# Patient Record
Sex: Male | Born: 1935 | Race: White | Hispanic: No | Marital: Married | Smoking: Former smoker
Health system: Southern US, Community
[De-identification: ages and names within clinical notes are randomized; demographics above are authoritative.]

## PROBLEM LIST (undated history)

## (undated) DIAGNOSIS — G459 Transient cerebral ischemic attack, unspecified: Secondary | ICD-10-CM

## (undated) DIAGNOSIS — K5900 Constipation, unspecified: Secondary | ICD-10-CM

## (undated) DIAGNOSIS — I6523 Occlusion and stenosis of bilateral carotid arteries: Secondary | ICD-10-CM

## (undated) DIAGNOSIS — M79652 Pain in left thigh: Secondary | ICD-10-CM

## (undated) DIAGNOSIS — A4189 Other specified sepsis: Secondary | ICD-10-CM

## (undated) DIAGNOSIS — E7800 Pure hypercholesterolemia, unspecified: Secondary | ICD-10-CM

## (undated) DIAGNOSIS — C61 Malignant neoplasm of prostate: Secondary | ICD-10-CM

## (undated) DIAGNOSIS — L03115 Cellulitis of right lower limb: Secondary | ICD-10-CM

## (undated) DIAGNOSIS — A759 Typhus fever, unspecified: Secondary | ICD-10-CM

## (undated) DIAGNOSIS — R001 Bradycardia, unspecified: Secondary | ICD-10-CM

## (undated) DIAGNOSIS — I700 Atherosclerosis of aorta: Secondary | ICD-10-CM

## (undated) DIAGNOSIS — I10 Essential (primary) hypertension: Secondary | ICD-10-CM

## (undated) DIAGNOSIS — A90 Dengue fever [classical dengue]: Secondary | ICD-10-CM

## (undated) DIAGNOSIS — H9190 Unspecified hearing loss, unspecified ear: Secondary | ICD-10-CM

## (undated) DIAGNOSIS — E875 Hyperkalemia: Secondary | ICD-10-CM

## (undated) DIAGNOSIS — B379 Candidiasis, unspecified: Secondary | ICD-10-CM

## (undated) DIAGNOSIS — N183 Chronic kidney disease, stage 3 unspecified: Secondary | ICD-10-CM

## (undated) DIAGNOSIS — Z87442 Personal history of urinary calculi: Secondary | ICD-10-CM

## (undated) DIAGNOSIS — M1990 Unspecified osteoarthritis, unspecified site: Secondary | ICD-10-CM

## (undated) DIAGNOSIS — Z8673 Personal history of transient ischemic attack (TIA), and cerebral infarction without residual deficits: Secondary | ICD-10-CM

## (undated) DIAGNOSIS — N200 Calculus of kidney: Secondary | ICD-10-CM

## (undated) DIAGNOSIS — M25532 Pain in left wrist: Secondary | ICD-10-CM

## (undated) DIAGNOSIS — N529 Male erectile dysfunction, unspecified: Secondary | ICD-10-CM

## (undated) DIAGNOSIS — J209 Acute bronchitis, unspecified: Secondary | ICD-10-CM

## (undated) DIAGNOSIS — Z87440 Personal history of urinary (tract) infections: Secondary | ICD-10-CM

## (undated) HISTORY — DX: Chronic kidney disease, stage 3 unspecified: N18.30

## (undated) HISTORY — DX: Male erectile dysfunction, unspecified: N52.9

## (undated) HISTORY — DX: Hyperkalemia: E87.5

## (undated) HISTORY — PX: CATARACT EXTRACTION: SUR2

## (undated) HISTORY — DX: Occlusion and stenosis of bilateral carotid arteries: I65.23

## (undated) HISTORY — PX: LEG SURGERY: SHX1003

## (undated) HISTORY — PX: TOTAL HIP ARTHROPLASTY: SHX124

## (undated) HISTORY — DX: Unspecified osteoarthritis, unspecified site: M19.90

## (undated) HISTORY — DX: Pain in left wrist: M25.532

## (undated) HISTORY — DX: Atherosclerosis of aorta: I70.0

## (undated) HISTORY — DX: Calculus of kidney: N20.0

## (undated) HISTORY — DX: Pain in left thigh: M79.652

## (undated) HISTORY — DX: Acute bronchitis, unspecified: J20.9

## (undated) HISTORY — DX: Bradycardia, unspecified: R00.1

## (undated) HISTORY — DX: Other specified sepsis: A41.89

## (undated) HISTORY — DX: Malignant neoplasm of prostate: C61

## (undated) HISTORY — DX: Candidiasis, unspecified: B37.9

## (undated) HISTORY — DX: Personal history of transient ischemic attack (TIA), and cerebral infarction without residual deficits: Z86.73

## (undated) HISTORY — DX: Cellulitis of right lower limb: L03.115

## (undated) HISTORY — DX: Unspecified hearing loss, unspecified ear: H91.90

## (undated) HISTORY — DX: Pure hypercholesterolemia, unspecified: E78.00

## (undated) HISTORY — DX: Constipation, unspecified: K59.00

## (undated) HISTORY — DX: Personal history of urinary (tract) infections: Z87.440

---

## 1995-04-19 HISTORY — PX: COLONOSCOPY: SHX174

## 1996-04-18 HISTORY — PX: KNEE ARTHROSCOPY: SUR90

## 2010-04-18 DIAGNOSIS — G459 Transient cerebral ischemic attack, unspecified: Secondary | ICD-10-CM

## 2010-04-18 HISTORY — DX: Transient cerebral ischemic attack, unspecified: G45.9

## 2011-04-19 DIAGNOSIS — J189 Pneumonia, unspecified organism: Secondary | ICD-10-CM

## 2011-04-19 HISTORY — DX: Pneumonia, unspecified organism: J18.9

## 2015-12-30 DIAGNOSIS — Z7189 Other specified counseling: Secondary | ICD-10-CM | POA: Insufficient documentation

## 2015-12-30 DIAGNOSIS — Z7185 Encounter for immunization safety counseling: Secondary | ICD-10-CM | POA: Insufficient documentation

## 2016-03-01 DIAGNOSIS — I700 Atherosclerosis of aorta: Secondary | ICD-10-CM | POA: Insufficient documentation

## 2016-03-01 HISTORY — DX: Atherosclerosis of aorta: I70.0

## 2016-06-19 ENCOUNTER — Encounter: Payer: Self-pay | Admitting: Medical Oncology

## 2016-06-19 ENCOUNTER — Emergency Department
Admission: EM | Admit: 2016-06-19 | Discharge: 2016-06-19 | Disposition: A | Discharge status: Home / Self Care | Payer: Medicare Other | Attending: Emergency Medicine | Admitting: Emergency Medicine

## 2016-06-19 ENCOUNTER — Emergency Department: Payer: Medicare Other

## 2016-06-19 DIAGNOSIS — Z79899 Other long term (current) drug therapy: Secondary | ICD-10-CM | POA: Diagnosis not present

## 2016-06-19 DIAGNOSIS — W0110XA Fall on same level from slipping, tripping and stumbling with subsequent striking against unspecified object, initial encounter: Secondary | ICD-10-CM | POA: Diagnosis not present

## 2016-06-19 DIAGNOSIS — I10 Essential (primary) hypertension: Secondary | ICD-10-CM | POA: Insufficient documentation

## 2016-06-19 DIAGNOSIS — S52502A Unspecified fracture of the lower end of left radius, initial encounter for closed fracture: Secondary | ICD-10-CM | POA: Insufficient documentation

## 2016-06-19 DIAGNOSIS — Y999 Unspecified external cause status: Secondary | ICD-10-CM | POA: Insufficient documentation

## 2016-06-19 DIAGNOSIS — S59912A Unspecified injury of left forearm, initial encounter: Secondary | ICD-10-CM | POA: Diagnosis present

## 2016-06-19 DIAGNOSIS — Y939 Activity, unspecified: Secondary | ICD-10-CM | POA: Diagnosis not present

## 2016-06-19 DIAGNOSIS — Y92511 Restaurant or cafe as the place of occurrence of the external cause: Secondary | ICD-10-CM | POA: Insufficient documentation

## 2016-06-19 DIAGNOSIS — Z7982 Long term (current) use of aspirin: Secondary | ICD-10-CM | POA: Insufficient documentation

## 2016-06-19 HISTORY — DX: Transient cerebral ischemic attack, unspecified: G45.9

## 2016-06-19 HISTORY — DX: Essential (primary) hypertension: I10

## 2016-06-19 HISTORY — DX: Pure hypercholesterolemia, unspecified: E78.00

## 2016-06-19 NOTE — ED Provider Notes (Signed)
[facility] Emergency Department Provider Note ____________________________________________   I have reviewed the triage vital signs and the triage nursing note.  HISTORY  Chief Complaint Fall   Historian Patient and wife  HPI Robert [name] is a 83 y.o. male with history of hypertension and TIA, on baby aspirin, tripped and fell over a vent carpet about 6 days ago at a restaurant landed on his left outstretched arm and also struck his left forehead. No loss of consciousness. He had some left wrist soreness, but thought it would go away. He was not seen at that point time. His left wrist has continued to be painful and is actually slightly more painful and more swollen than it was in the beginning. At this point he is concerned it might be broken.  Pain is moderate. Swelling is moderate.    Past Medical History:  Diagnosis Date  . High cholesterol   . Hypertension   . TIA (transient ischemic attack)     There are no active problems to display for this patient.   Past Surgical History:  Procedure Laterality Date  . TOTAL HIP ARTHROPLASTY Bilateral     Prior to Admission medications   Medication Sig Start Date End Date Taking? Authorizing Provider  amLODipine (NORVASC) 5 MG tablet Take 5 mg by mouth daily.   Yes Historical Provider, MD  aspirin EC 325 MG tablet Take 81 mg by mouth daily.   Yes Historical Provider, MD  atorvastatin (LIPITOR) 10 MG tablet Take 10 mg by mouth daily.   Yes Historical Provider, MD  hydrALAZINE (APRESOLINE) 25 MG tablet Take 25 mg by mouth 3 (three) times daily.   Yes Historical Provider, MD  Omega-3 Fatty Acids (FISH OIL PO) Take 1 capsule by mouth daily.   Yes Historical Provider, MD    Allergies  Allergen Reactions  . Morphine And Related     No family history on file.  Social History Social History  Substance Use Topics  . Smoking status: Not on file  . Smokeless tobacco: Not on file  . Alcohol use Not on file     Review of Systems  Constitutional: Negative for recent illness. Eyes: Negative for visual changes. ENT: Negative for sore throat. Cardiovascular: Negative for chest pain. Respiratory: Negative for shortness of breath. Gastrointestinal: Negative for abdominal pain. Genitourinary: Negative for dysuria. Musculoskeletal: Negative for back pain.  Left wrist pain as per history of present illness. Skin: Negative for rash. Neurological: Negative for headache. 10 point Review of Systems otherwise negative ____________________________________________   PHYSICAL EXAM:  VITAL SIGNS: ED Triage Vitals  Enc Vitals Group     BP 06/19/16 0841 (!) 181/81     Pulse Rate 06/19/16 0841 63     Resp 06/19/16 0841 17     Temp --      Temp Source 06/19/16 0841 Oral     SpO2 06/19/16 0841 96 %     Weight 06/19/16 0842 212 lb (96.2 kg)     Height 06/19/16 0842 6' (1.829 m)     Head Circumference --      Peak Flow --      Pain Score --      Pain Loc --      Pain Edu? --      Excl. in [place]? --      Constitutional: Alert and oriented. Well appearing and in no distress. HEENT   Head: Normocephalic.  Small ill-appearing      Eyes: Conjunctivae are  normal. PERRL. Normal extraocular movements.      Ears:         Nose: No congestion/rhinnorhea.   Mouth/Throat: Mucous membranes are moist.   Neck: No stridor. Cardiovascular/Chest: Normal rate, regular rhythm.  No murmurs, rubs, or gallops. Respiratory: Normal respiratory effort without tachypnea nor retractions. Breath sounds are clear and equal bilaterally. No wheezes/rales/rhonchi. Gastrointestinal: Soft. No distention, no guarding, no rebound. Nontender.    Genitourinary/rectal:Deferred Musculoskeletal: Swelling and tenderness distal left arm at the wrist. No snuffbox tenderness. Neurovascularly intact. Neurologic:  Normal speech and language. No gross or focal neurologic deficits are appreciated. Skin:  Skin is warm, dry and intact.  No rash noted. Psychiatric: Mood and affect are normal. Speech and behavior are normal. Patient exhibits appropriate insight and judgment.   ____________________________________________  LABS (pertinent positives/negatives)  Labs Reviewed - No data to display  ____________________________________________    EKG I, Lisa Roca, MD, the attending physician have personally viewed and interpreted all ECGs.  None ____________________________________________  RADIOLOGY All Xrays were viewed by me. Imaging interpreted by Radiologist.  Left upper extremity x-ray:  IMPRESSION: Suspect small avulsion injury along the radial and volar aspect of the distal radius. __________________________________________  PROCEDURES  Procedure(s) performed: SPLINT APPLICATION Date/Time: 99991111 AM Authorized by: Lisa Roca Consent: Verbal consent obtained. Risks and benefits: risks, benefits and alternatives were discussed Consent given by: patient Splint applied by: ED technician, reviewed by Dr. Reita Cliche MD Location details: left volar, short arm Supplies used: ortho glass and acewrap Post-procedure: The splinted body part was neurovascularly unchanged following the procedure. Patient tolerance: Patient tolerated the procedure well with no immediate complications.    Critical Care performed: None  ____________________________________________   ED COURSE / ASSESSMENT AND PLAN  Pertinent labs & imaging results that were available during my care of the patient were reviewed by me and considered in my medical decision making (see chart for details).   [name] had a fall a week ago and is still having pain and swelling, I suspect likely underlying fracture.  Neurovascularly intact clinically.  X-ray confirms small avulsion fracture of the distal radius. Patient was placed in a volar splint. He will be referred to orthopedics for further follow-up and possible  casting.    CONSULTATIONS:  None   Patient / Family / Caregiver informed of clinical course, medical decision-making process, and agree with plan.   I discussed return precautions, follow-up instructions, and discharge instructions with patient and/or family.  DISCHARGE INSTRUCTIONS:  Your x-ray shows fracture of the left distal radius, near the wrist. You're placed in a splint. Please follow up with orthopedic surgeon for likely casting.  Return to emergency department immediately for any worsening pain, swelling, numbness or tingling or bluish discoloration to the fingers. ___________________________________________   FINAL CLINICAL IMPRESSION(S) / ED DIAGNOSES   Final diagnoses:  Closed fracture of distal end of left radius, unspecified fracture morphology, initial encounter              Note: This dictation was prepared with Dragon dictation. Any transcriptional errors that result from this process are unintentional    Lisa Roca, MD 06/19/16 1012

## 2016-06-19 NOTE — Discharge Instructions (Signed)
Your x-ray shows fracture of the left distal radius, near the wrist. You're placed in a splint. Please follow up with orthopedic surgeon for likely casting.  Return to emergency department immediately for any worsening pain, swelling, numbness or tingling or bluish discoloration to the fingers.

## 2016-06-19 NOTE — ED Notes (Addendum)
FIRST NURSE NOTE: Pt fell 6 days ago on to left wrist, continues to c/o pain. Pt states he tripped on the carpet at a restaurant. Pt states he also had a small abrasion to the left eye brown which has since healed. Pt states he only takes an 81mg  ASA.

## 2016-06-19 NOTE — ED Notes (Signed)
XR at bedside

## 2016-06-19 NOTE — ED Triage Notes (Signed)
Pt reports that he was in a restaurant when he tripped over the carpet Monday. Pt reports he fell onto his left wrist and injured it along with hitting his head. Pt denies LOC, no use of blood thinners. Pt A/O x 4

## 2016-06-27 DIAGNOSIS — N528 Other male erectile dysfunction: Secondary | ICD-10-CM | POA: Insufficient documentation

## 2016-09-29 ENCOUNTER — Encounter: Payer: Self-pay | Admitting: Emergency Medicine

## 2016-09-29 ENCOUNTER — Emergency Department
Admission: EM | Admit: 2016-09-29 | Discharge: 2016-09-29 | Disposition: A | Discharge status: Home / Self Care | Payer: Medicare Other | Attending: Emergency Medicine | Admitting: Emergency Medicine

## 2016-09-29 DIAGNOSIS — R319 Hematuria, unspecified: Secondary | ICD-10-CM | POA: Diagnosis not present

## 2016-09-29 DIAGNOSIS — Z8673 Personal history of transient ischemic attack (TIA), and cerebral infarction without residual deficits: Secondary | ICD-10-CM | POA: Insufficient documentation

## 2016-09-29 DIAGNOSIS — Z96643 Presence of artificial hip joint, bilateral: Secondary | ICD-10-CM | POA: Diagnosis not present

## 2016-09-29 DIAGNOSIS — I10 Essential (primary) hypertension: Secondary | ICD-10-CM | POA: Diagnosis not present

## 2016-09-29 DIAGNOSIS — N3090 Cystitis, unspecified without hematuria: Secondary | ICD-10-CM | POA: Insufficient documentation

## 2016-09-29 DIAGNOSIS — N489 Disorder of penis, unspecified: Secondary | ICD-10-CM | POA: Diagnosis present

## 2016-09-29 LAB — URINALYSIS, ROUTINE W REFLEX MICROSCOPIC
BILIRUBIN URINE: NEGATIVE
Glucose, UA: NEGATIVE mg/dL
Ketones, ur: NEGATIVE mg/dL
Nitrite: NEGATIVE
PH: 5 (ref 5.0–8.0)
Protein, ur: 100 mg/dL — AB
SPECIFIC GRAVITY, URINE: 1.011 (ref 1.005–1.030)
SQUAMOUS EPITHELIAL / LPF: NONE SEEN

## 2016-09-29 LAB — URINALYSIS, COMPLETE (UACMP) WITH MICROSCOPIC
Bilirubin Urine: NEGATIVE
Glucose, UA: NEGATIVE mg/dL
KETONES UR: 5 mg/dL — AB
Leukocytes, UA: NEGATIVE
Nitrite: POSITIVE — AB
PH: 5 (ref 5.0–8.0)
Protein, ur: 100 mg/dL — AB
SPECIFIC GRAVITY, URINE: 1.02 (ref 1.005–1.030)
Squamous Epithelial / LPF: NONE SEEN

## 2016-09-29 LAB — PROTIME-INR
INR: 1.01
Prothrombin Time: 13.3 seconds (ref 11.4–15.2)

## 2016-09-29 LAB — TYPE AND SCREEN
ABO/RH(D): AB POS
ANTIBODY SCREEN: NEGATIVE

## 2016-09-29 LAB — COMPREHENSIVE METABOLIC PANEL
ALBUMIN: 4.1 g/dL (ref 3.5–5.0)
ALK PHOS: 63 U/L (ref 38–126)
ALT: 15 U/L — ABNORMAL LOW (ref 17–63)
AST: 22 U/L (ref 15–41)
Anion gap: 6 (ref 5–15)
BUN: 27 mg/dL — AB (ref 6–20)
CALCIUM: 9.6 mg/dL (ref 8.9–10.3)
CO2: 23 mmol/L (ref 22–32)
Chloride: 113 mmol/L — ABNORMAL HIGH (ref 101–111)
Creatinine, Ser: 1.46 mg/dL — ABNORMAL HIGH (ref 0.61–1.24)
GFR calc Af Amer: 46 mL/min — ABNORMAL LOW (ref >60)
GFR calc non Af Amer: 40 mL/min — ABNORMAL LOW (ref >60)
Glucose, Bld: 83 mg/dL (ref 65–99)
Potassium: 4.3 mmol/L (ref 3.5–5.1)
Sodium: 142 mmol/L (ref 135–145)
TOTAL PROTEIN: 7.1 g/dL (ref 6.5–8.1)
Total Bilirubin: 0.6 mg/dL (ref 0.3–1.2)

## 2016-09-29 LAB — CBC WITH DIFFERENTIAL/PLATELET
Basophils Absolute: 0.1 10*3/uL (ref 0–0.1)
Basophils Relative: 1 %
EOS PCT: 3 %
Eosinophils Absolute: 0.3 10*3/uL (ref 0–0.7)
HEMATOCRIT: 41.8 % (ref 40.0–52.0)
HEMOGLOBIN: 14.1 g/dL (ref 13.0–18.0)
LYMPHS ABS: 1.5 10*3/uL (ref 1.0–3.6)
LYMPHS PCT: 15 %
MCH: 31.7 pg (ref 26.0–34.0)
MCHC: 33.6 g/dL (ref 32.0–36.0)
MCV: 94.1 fL (ref 80.0–100.0)
Monocytes Absolute: 1.1 10*3/uL — ABNORMAL HIGH (ref 0.2–1.0)
Monocytes Relative: 10 %
NEUTROS PCT: 71 %
Neutro Abs: 7.4 10*3/uL — ABNORMAL HIGH (ref 1.4–6.5)
Platelets: 191 10*3/uL (ref 150–440)
RBC: 4.44 MIL/uL (ref 4.40–5.90)
RDW: 15.8 % — ABNORMAL HIGH (ref 11.5–14.5)
WBC: 10.4 10*3/uL (ref 3.8–10.6)

## 2016-09-29 MED ORDER — LIDOCAINE HCL 2 % EX GEL
CUTANEOUS | Status: AC
  Administered 2016-09-29: 13:00:00
  Filled 2016-09-29: qty 10

## 2016-09-29 MED ORDER — DEXTROSE 5 % IV SOLN
1.0000 g | Freq: Once | INTRAVENOUS | Status: AC
  Administered 2016-09-29: 1 g via INTRAVENOUS
  Filled 2016-09-29: qty 10

## 2016-09-29 MED ORDER — CEPHALEXIN 250 MG PO CAPS: 250.0000 mg | ORAL_CAPSULE | Freq: Four times a day (QID) | ORAL | 0 refills | Status: DC

## 2016-09-29 NOTE — ED Provider Notes (Signed)
[facility] Emergency Department Provider Note       Time seen: ----------------------------------------- 9:28 AM on 09/29/2016 -----------------------------------------     I have reviewed the triage vital signs and the nursing notes.   HISTORY   Chief Complaint blood from penis    HPI Robert [name] is a 83 y.o. male who presents to the ED for hematuria. Patient has saturated multiple pads today from the bleeding. Bleeding seemed to start last night, he denies any pain or any urinary hesitancy or urgency. He denies fevers, chills, other complaints. Patient has not had a history of this before, nothing makes it better or worse.   Past Medical History:  Diagnosis Date  . High cholesterol   . Hypertension   . TIA (transient ischemic attack)     There are no active problems to display for this patient.   Past Surgical History:  Procedure Laterality Date  . TOTAL HIP ARTHROPLASTY Bilateral     Allergies Morphine and related  Social History Social History  Substance Use Topics  . Smoking status: Not on file  . Smokeless tobacco: Not on file  . Alcohol use Not on file    Review of Systems Constitutional: Negative for fever. Cardiovascular: Negative for chest pain. Respiratory: Negative for shortness of breath. Gastrointestinal: Negative for abdominal pain, vomiting and diarrhea. Genitourinary: Positive for hematuria Musculoskeletal: Negative for back pain. Skin: Negative for rash. Neurological: Negative for headaches, focal weakness or numbness.  All systems negative/normal/unremarkable except as stated in the HPI  ____________________________________________   PHYSICAL EXAM:  VITAL SIGNS: ED Triage Vitals  Enc Vitals Group     BP 09/29/16 0921 126/68     Pulse Rate 09/29/16 0921 88     Resp 09/29/16 0921 20     Temp 09/29/16 0921 97.4 F (36.3 C)     Temp Source 09/29/16 0921 Oral     SpO2 09/29/16 0921 97 %     Weight  09/29/16 0922 210 lb (95.3 kg)     Height 09/29/16 0922 6' (1.829 m)     Head Circumference --      Peak Flow --      Pain Score --      Pain Loc --      Pain Edu? --      Excl. in [place]? --     Constitutional: Alert and oriented. Well appearing and in no distress. Eyes: Conjunctivae are normal. Normal extraocular movements. ENT   Head: Normocephalic and atraumatic.   Nose: No congestion/rhinnorhea.   Mouth/Throat: Mucous membranes are moist.   Neck: No stridor. Cardiovascular: Normal rate, regular rhythm. No murmurs, rubs, or gallops. Respiratory: Normal respiratory effort without tachypnea nor retractions. Breath sounds are clear and equal bilaterally. No wheezes/rales/rhonchi. Gastrointestinal: Soft and nontender. Normal bowel sounds Genitourinary: Positive for frank hematuria Musculoskeletal: Nontender with normal range of motion in extremities. No lower extremity tenderness nor edema. Neurologic:  Normal speech and language. No gross focal neurologic deficits are appreciated.  Skin:  Skin is warm, dry and intact. No rash noted. Psychiatric: Mood and affect are normal. Speech and behavior are normal.  ____________________________________________  ED COURSE:  Pertinent labs & imaging results that were available during my care of the patient were reviewed by me and considered in my medical decision making (see chart for details). Patient presents for hematuria, we will assess with labs and imaging as indicated. Clinical Course as of Sep 30 1330  Thu Sep 29, 2016  1217 16  [place] coud catheter placed by me. We will initiate some bladder irrigation.  [JW]  1251 Urine is now clear after bladder irrigation  [JW]    Clinical Course User Index [JW] Earleen Newport, MD   Procedures ____________________________________________   LABS (pertinent positives/negatives)  Labs Reviewed  CBC WITH DIFFERENTIAL/PLATELET - Abnormal; Notable for the following:       Result  Value   RDW 15.8 (*)    Neutro Abs 7.4 (*)    Monocytes Absolute 1.1 (*)    All other components within normal limits  COMPREHENSIVE METABOLIC PANEL - Abnormal; Notable for the following:    Chloride 113 (*)    BUN 27 (*)    Creatinine, Ser 1.46 (*)    ALT 15 (*)    GFR calc non Af Amer 40 (*)    GFR calc Af Amer 46 (*)    All other components within normal limits  URINALYSIS, COMPLETE (UACMP) WITH MICROSCOPIC - Abnormal; Notable for the following:    Color, Urine AMBER (*)    APPearance CLOUDY (*)    Hgb urine dipstick MODERATE (*)    Ketones, ur 5 (*)    Protein, ur 100 (*)    Nitrite POSITIVE (*)    Bacteria, UA MANY (*)    All other components within normal limits  URINALYSIS, ROUTINE W REFLEX MICROSCOPIC - Abnormal; Notable for the following:    Color, Urine AMBER (*)    APPearance HAZY (*)    Hgb urine dipstick LARGE (*)    Protein, ur 100 (*)    Leukocytes, UA TRACE (*)    Bacteria, UA RARE (*)    All other components within normal limits  URINE CULTURE  PROTIME-INR  TYPE AND SCREEN   ____________________________________________  FINAL ASSESSMENT AND PLAN  Hematuria, cystitis  Plan: Patient's labs and imaging were dictated above. Patient had presented for hematuria, we attempted three-way bladder irrigation but could not place the large catheter due to likely enlarged prostate. I placed a 68 [place] coud and we irrigated his bladder with eventual clearing. He does have possible evidence of UTI although is difficult to tell due to the amount of blood he was passing. We did start him on IV antibiotics and be discharged with Keflex. I will refer him to urology for outpatient follow-up.   Earleen Newport, MD   Note: This note was generated in part or whole with voice recognition software. Voice recognition is usually quite accurate but there are transcription errors that can and very often do occur. I apologize for any typographical errors that were not  detected and corrected.     Earleen Newport, MD 09/29/16 [phone]

## 2016-09-29 NOTE — ED Notes (Signed)
Foley attached to leg bag, pts wife shown how to emtpy leg bag

## 2016-09-29 NOTE — ED Triage Notes (Signed)
Pt wife reports he saturated 5 depends from bleeding from penis last night. Pt denies any pain.  NAD. Not weak at this time.

## 2016-10-01 LAB — URINE CULTURE: SPECIAL REQUESTS: NORMAL

## 2016-10-03 ENCOUNTER — Ambulatory Visit (INDEPENDENT_AMBULATORY_CARE_PROVIDER_SITE_OTHER): Payer: Medicare Other | Admitting: Urology

## 2016-10-03 ENCOUNTER — Encounter: Payer: Self-pay | Admitting: Urology

## 2016-10-03 VITALS — BP 110/66 | HR 84 | Ht 72.0 in | Wt 210.0 lb

## 2016-10-03 DIAGNOSIS — N3001 Acute cystitis with hematuria: Secondary | ICD-10-CM | POA: Diagnosis not present

## 2016-10-03 DIAGNOSIS — I10 Essential (primary) hypertension: Secondary | ICD-10-CM | POA: Insufficient documentation

## 2016-10-03 DIAGNOSIS — R339 Retention of urine, unspecified: Secondary | ICD-10-CM | POA: Diagnosis not present

## 2016-10-03 DIAGNOSIS — E7800 Pure hypercholesterolemia, unspecified: Secondary | ICD-10-CM | POA: Insufficient documentation

## 2016-10-03 DIAGNOSIS — Z8673 Personal history of transient ischemic attack (TIA), and cerebral infarction without residual deficits: Secondary | ICD-10-CM | POA: Insufficient documentation

## 2016-10-03 MED ORDER — CIPROFLOXACIN HCL 500 MG PO TABS: 500.0000 mg | ORAL_TABLET | Freq: Two times a day (BID) | ORAL | 0 refills | Status: DC

## 2016-10-03 NOTE — Progress Notes (Signed)
10/03/2016 9:00 AM   Robert [name] [birth year] [id number]  Referring provider: Dion Body, MD [facility] [place], [place] [address]  Chief Complaint  Patient presents with  . Urinary Retention    New Patient    HPI: The gentleman was assessed in the emergency room on 09/29/2016 for blood in the urine it appeared to be high-volume soaking pads 24 hours. He was treated with a 16 [place] coud catheter and some bladder irrigation to clear. Serum creatinine was 1.46. He was given IV antibiotics and sent home on Keflex. He did not have a CT scan. He did have a positive urine culture resistant to Keflex.   The patient had a kidney stone years ago but has never had previous GU surgery. He does not get bladder infections. He normally voids every 2 or 3 hours and gets up twice at night. He wears 1 pad per day. He does not take bladder medication. The catheter urine is now clear  He has a long smoking history. He takes daily aspirin but no blood thinners  Modifying factors: There are no other modifying factors  Associated signs and symptoms: There are no other associated signs and symptoms Aggravating and relieving factors: There are no other aggravating or relieving factors Severity: Moderate Duration: Improved       PMH: Past Medical History:  Diagnosis Date  . Aortic atherosclerosis (Robert [name]) 03/01/2016  . Arthritis   . High cholesterol   . Hypertension   . TIA (transient ischemic attack)     Surgical History: Past Surgical History:  Procedure Laterality Date  . LEG SURGERY    . TOTAL HIP ARTHROPLASTY Bilateral     Home Medications:  Allergies as of 10/03/2016      Reactions   Morphine And Related    Tramadol Nausea Only   Other reaction(s): Vomiting      Medication List       Accurate as of 10/03/16  9:00 AM. Always use your most recent med list.          amLODipine 5 MG tablet Commonly known as:  NORVASC Take 5 mg by  mouth daily.   aspirin EC 325 MG tablet Take 81 mg by mouth daily.   atorvastatin 10 MG tablet Commonly known as:  LIPITOR Take 10 mg by mouth daily.   cephALEXin 250 MG capsule Commonly known as:  KEFLEX Take 1 capsule (250 mg total) by mouth 4 (four) times daily.   ciprofloxacin 500 MG tablet Commonly known as:  CIPRO Take 1 tablet (500 mg total) by mouth every 12 (twelve) hours.   FISH OIL PO Take 1 capsule by mouth daily.   hydrALAZINE 25 MG tablet Commonly known as:  APRESOLINE Take 25 mg by mouth 3 (three) times daily.       Allergies:  Allergies  Allergen Reactions  . Morphine And Related   . Tramadol Nausea Only    Other reaction(s): Vomiting    Family History: Family History  Problem Relation Age of Onset  . Prostate cancer Neg Hx   . Bladder Cancer Neg Hx   . Kidney cancer Neg Hx     Social History:  reports that he has quit smoking. He has never used smokeless tobacco. He reports that he drinks alcohol. He reports that he does not use drugs.  ROS: UROLOGY Frequent Urination?: Yes Hard to postpone urination?: Yes Burning/pain with urination?: No Get up at night to urinate?: Yes Leakage of urine?:  Yes Urine stream starts and stops?: Yes Trouble starting stream?: No Do you have to strain to urinate?: No Blood in urine?: Yes Urinary tract infection?: Yes Sexually transmitted disease?: No Injury to kidneys or bladder?: No Painful intercourse?: No Weak stream?: Yes Erection problems?: Yes Penile pain?: No  Gastrointestinal Nausea?: No Vomiting?: No Indigestion/heartburn?: No Diarrhea?: No Constipation?: No  Constitutional Fever: No Night sweats?: No Weight loss?: No Fatigue?: No  Skin Skin rash/lesions?: No Itching?: No  Eyes Blurred vision?: No Double vision?: No  Ears/Nose/Throat Sore throat?: No Sinus problems?: Yes  Hematologic/Lymphatic Swollen glands?: No Easy bruising?: No  Cardiovascular Leg swelling?:  Yes Chest pain?: No  Respiratory Cough?: No Shortness of breath?: No  Endocrine Excessive thirst?: No  Musculoskeletal Back pain?: Yes Joint pain?: No  Neurological Headaches?: No Dizziness?: No  Psychologic Depression?: No Anxiety?: No  Physical Exam: BP 110/66   Pulse 84   Ht 6' (1.829 m)   Wt 95.3 kg (210 lb)   BMI 28.48 kg/m   Constitutional:  Alert and oriented, No acute distress. HEENT: Robert [name] AT, moist mucus membranes.  Trachea midline, no masses. Cardiovascular: No clubbing, cyanosis, or edema. Respiratory: Normal respiratory effort, no increased work of breathing. GI: Abdomen is soft, nontender, nondistended, no abdominal masses GU: No CVA tenderness. Male genitalia normal Skin: No rashes, bruises or suspicious lesions. Lymph: No cervical or inguinal adenopathy. Neurologic: Grossly intact, no focal deficits, moving all 4 extremities. Psychiatric: Normal mood and affect.  Laboratory Data: Lab Results  Component Value Date   WBC 10.4 09/29/2016   HGB 14.1 09/29/2016   HCT 41.8 09/29/2016   MCV 94.1 09/29/2016   PLT 191 09/29/2016    Lab Results  Component Value Date   CREATININE 1.46 (H) 09/29/2016    No results found for: PSA  No results found for: TESTOSTERONE  No results found for: HGBA1C  Urinalysis    Component Value Date/Time   COLORURINE AMBER (A) 09/29/2016 1240   APPEARANCEUR HAZY (A) 09/29/2016 1240   LABSPEC 1.011 09/29/2016 1240   PHURINE 5.0 09/29/2016 1240   GLUCOSEU NEGATIVE 09/29/2016 1240   HGBUR LARGE (A) 09/29/2016 1240   BILIRUBINUR NEGATIVE 09/29/2016 1240   KETONESUR NEGATIVE 09/29/2016 1240   PROTEINUR 100 (A) 09/29/2016 1240   NITRITE NEGATIVE 09/29/2016 1240   LEUKOCYTESUR TRACE (A) 09/29/2016 1240    Pertinent Imaging: None  Assessment & Plan:  The patient was placed on ciprofloxacin 500 mg twice a day for 1 week. He will get a renal ultrasound in the next couple of days. He will have a trial of voiding at  the end of the week. He does have a significant smoking history but at this stage I do not plan on performing cystoscopy based upon the positive culture. It may be reasonable to follow him for microscopic hematuria post treatment of urinary tract infection  There are no diagnoses linked to this encounter.  No Follow-up on file.  [name], MD  [facility] [address], [place], [place] [address] [phone]

## 2016-10-05 ENCOUNTER — Ambulatory Visit
Admission: RE | Admit: 2016-10-05 | Discharge: 2016-10-05 | Disposition: A | Discharge status: Home / Self Care | Payer: Medicare Other | Source: Ambulatory Visit | Attending: Urology | Admitting: Urology

## 2016-10-05 DIAGNOSIS — N3001 Acute cystitis with hematuria: Secondary | ICD-10-CM | POA: Insufficient documentation

## 2016-10-05 DIAGNOSIS — R339 Retention of urine, unspecified: Secondary | ICD-10-CM | POA: Diagnosis not present

## 2016-10-06 ENCOUNTER — Ambulatory Visit: Payer: Medicare Other

## 2016-10-06 ENCOUNTER — Ambulatory Visit (INDEPENDENT_AMBULATORY_CARE_PROVIDER_SITE_OTHER): Payer: Medicare Other | Admitting: Urology

## 2016-10-06 ENCOUNTER — Encounter: Payer: Self-pay | Admitting: Urology

## 2016-10-06 VITALS — BP 126/70 | HR 71 | Ht 72.0 in | Wt 209.0 lb

## 2016-10-06 DIAGNOSIS — R310 Gross hematuria: Secondary | ICD-10-CM

## 2016-10-06 DIAGNOSIS — N401 Enlarged prostate with lower urinary tract symptoms: Secondary | ICD-10-CM

## 2016-10-06 DIAGNOSIS — R338 Other retention of urine: Secondary | ICD-10-CM

## 2016-10-06 NOTE — Progress Notes (Signed)
Fill and Pull Catheter Removal  Patient is present today for a catheter removal.  Patient was cleaned and prepped in a sterile fashion 11ml of sterile water/ saline was instilled into the bladder when the patient felt the urge to urinate. 71ml of water was then drained from the balloon.  A 16FR foley coude cath was removed from the bladder no complications were noted .  Patient as then given some time to void on their own.  Patient can void  54ml on their own after some time.  Patient tolerated well.  Preformed by: Fonnie Jarvis, CMA  Follow up/ Additional notes: patient will come back this afternoon for a PVR, patient encouraged to drink plenty of water today

## 2016-10-06 NOTE — Progress Notes (Signed)
10/06/2016 9:36 AM   Robert [name] 06/15/1920 [id number]  Referring provider: Dion Body, MD [facility] [place], [place] [address]  Chief Complaint  Patient presents with  . Urinary Retention    HPI: The gentleman was assessed in the emergency room on 09/29/2016 for blood in the urine it appeared to be high-volume soaking pads 24 hours. He was treated with a 16 [place] coud catheter and some bladder irrigation to clear. Serum creatinine was 1.46. He was given IV antibiotics and sent home on Keflex. He did not have a CT scan. He did have a positive urine culture resistant to Keflex.   The patient had a kidney stone years ago but has never had previous GU surgery. He does not get bladder infections. He normally voids every 2 or 3 hours and gets up twice at night. He wears 1 pad per day. He does not take bladder medication. The catheter urine is now clear  He has a long smoking history. He takes daily aspirin but no blood thinners  Modifying factors: There are no other modifying factors  Associated signs and symptoms: There are no other associated signs and symptoms Aggravating and relieving factors: There are no other aggravating or relieving factors Severity: Moderate Duration: Improved  Interval history: The patient was started on ciprofloxacin for an Escherichia coli UTI which was thought to be the reason for his hematuria. He presents today for trial of void.  The plan has been told he undergo cystoscopy if his hematuria does not resolve after treatment of his retention and UTI.   PMH: Past Medical History:  Diagnosis Date  . Aortic atherosclerosis ([place]) 03/01/2016  . Arthritis   . High cholesterol   . Hypertension   . TIA (transient ischemic attack)     Surgical History: Past Surgical History:  Procedure Laterality Date  . LEG SURGERY    . TOTAL HIP ARTHROPLASTY Bilateral     Home Medications:  Allergies as of  10/06/2016      Reactions   Morphine And Related    Tramadol Nausea Only   Other reaction(s): Vomiting      Medication List       Accurate as of 10/06/16  9:36 AM. Always use your most recent med list.          amLODipine 5 MG tablet Commonly known as:  NORVASC Take 5 mg by mouth daily.   aspirin EC 325 MG tablet Take 81 mg by mouth daily.   atorvastatin 10 MG tablet Commonly known as:  LIPITOR Take 10 mg by mouth daily.   ciprofloxacin 500 MG tablet Commonly known as:  CIPRO Take 1 tablet (500 mg total) by mouth every 12 (twelve) hours.   FISH OIL PO Take 1 capsule by mouth daily.   hydrALAZINE 25 MG tablet Commonly known as:  APRESOLINE Take 25 mg by mouth 3 (three) times daily.       Allergies:  Allergies  Allergen Reactions  . Morphine And Related   . Tramadol Nausea Only    Other reaction(s): Vomiting    Family History: Family History  Problem Relation Age of Onset  . Prostate cancer Neg Hx   . Bladder Cancer Neg Hx   . Kidney cancer Neg Hx     Social History:  reports that he has quit smoking. He has never used smokeless tobacco. He reports that he drinks alcohol. He reports that he does not use drugs.  ROS: UROLOGY Frequent  Urination?: No Hard to postpone urination?: No Burning/pain with urination?: No Get up at night to urinate?: No Leakage of urine?: No Urine stream starts and stops?: No Trouble starting stream?: No Do you have to strain to urinate?: No Blood in urine?: No Urinary tract infection?: No Sexually transmitted disease?: No Injury to kidneys or bladder?: No Painful intercourse?: No Weak stream?: No Erection problems?: No Penile pain?: No  Gastrointestinal Nausea?: No Vomiting?: No Indigestion/heartburn?: No Diarrhea?: No Constipation?: No  Constitutional Fever: No Night sweats?: No Weight loss?: No Fatigue?: No  Skin Skin rash/lesions?: No Itching?: No  Eyes Blurred vision?: No Double vision?:  No  Ears/Nose/Throat Sore throat?: No Sinus problems?: No  Hematologic/Lymphatic Swollen glands?: No Easy bruising?: No  Cardiovascular Leg swelling?: No Chest pain?: No  Respiratory Cough?: No Shortness of breath?: No  Endocrine Excessive thirst?: No  Musculoskeletal Back pain?: No Joint pain?: No  Neurological Headaches?: No Dizziness?: No  Psychologic Depression?: No Anxiety?: No  Physical Exam: BP 126/70   Pulse 71   Ht 6' (1.829 m)   Wt 209 lb (94.8 kg)   BMI 28.35 kg/m   Constitutional:  Alert and oriented, No acute distress. HEENT: [place] AT, moist mucus membranes.  Trachea midline, no masses. Cardiovascular: No clubbing, cyanosis, or edema. Respiratory: Normal respiratory effort, no increased work of breathing. GI: Abdomen is soft, nontender, nondistended, no abdominal masses GU: No CVA tenderness.  Skin: No rashes, bruises or suspicious lesions. Lymph: No cervical or inguinal adenopathy. Neurologic: Grossly intact, no focal deficits, moving all 4 extremities. Psychiatric: Normal mood and affect.  Laboratory Data: Lab Results  Component Value Date   WBC 10.4 09/29/2016   HGB 14.1 09/29/2016   HCT 41.8 09/29/2016   MCV 94.1 09/29/2016   PLT 191 09/29/2016    Lab Results  Component Value Date   CREATININE 1.46 (H) 09/29/2016    No results found for: PSA  No results found for: TESTOSTERONE  No results found for: HGBA1C  Urinalysis    Component Value Date/Time   COLORURINE AMBER (A) 09/29/2016 1240   APPEARANCEUR HAZY (A) 09/29/2016 1240   LABSPEC 1.011 09/29/2016 1240   PHURINE 5.0 09/29/2016 1240   GLUCOSEU NEGATIVE 09/29/2016 1240   HGBUR LARGE (A) 09/29/2016 1240   BILIRUBINUR NEGATIVE 09/29/2016 1240   KETONESUR NEGATIVE 09/29/2016 1240   PROTEINUR 100 (A) 09/29/2016 1240   NITRITE NEGATIVE 09/29/2016 1240   LEUKOCYTESUR TRACE (A) 09/29/2016 1240    Pertinent Imaging: Renal ultrasound reviewed as above  Assessment & Plan:     1. Gross hematuria 2. UTI The patient will finish his course of ciprofloxacin. He passed his trial of void today. We'll see him back in 1 month to ensure he is voiding well and that his hematuria is resolved with appropriate treatment of his UTI.   Return in about 4 weeks (around 11/03/2016) for for symptom check/PVR/urinalysis.  Nickie Retort, MD  [facility] [address], [place], [place] [address] [phone]

## 2016-10-06 NOTE — Progress Notes (Unsigned)
Fill and Pull Catheter Removal  Patient is present today for a catheter removal.  Patient was cleaned and prepped in a sterile fashion ***ml of sterile water/ saline was instilled into the bladder when the patient felt the urge to urinate. ***ml of water was then drained from the balloon.  A ***FR foley cath was removed from the bladder {dnt complications:20057} .  Patient as then given some time to void on their own.  Patient {can/cannot:17900} void  ***ml on their own after some time.  Patient tolerated well.  Preformed by: ***  Follow up/ Additional notes: ***

## 2016-11-03 ENCOUNTER — Encounter: Payer: Self-pay | Admitting: Urology

## 2016-11-03 ENCOUNTER — Ambulatory Visit (INDEPENDENT_AMBULATORY_CARE_PROVIDER_SITE_OTHER): Payer: Medicare Other | Admitting: Urology

## 2016-11-03 VITALS — BP 122/65 | HR 69 | Ht 72.0 in | Wt 212.0 lb

## 2016-11-03 DIAGNOSIS — R310 Gross hematuria: Secondary | ICD-10-CM

## 2016-11-03 DIAGNOSIS — N401 Enlarged prostate with lower urinary tract symptoms: Secondary | ICD-10-CM

## 2016-11-03 DIAGNOSIS — R339 Retention of urine, unspecified: Secondary | ICD-10-CM

## 2016-11-03 DIAGNOSIS — R338 Other retention of urine: Secondary | ICD-10-CM | POA: Diagnosis not present

## 2016-11-03 LAB — URINALYSIS, COMPLETE
Bilirubin, UA: NEGATIVE
Glucose, UA: NEGATIVE
KETONES UA: NEGATIVE
Nitrite, UA: NEGATIVE
Urobilinogen, Ur: 0.2 mg/dL (ref 0.2–1.0)
pH, UA: 5.5 (ref 5.0–7.5)

## 2016-11-03 LAB — MICROSCOPIC EXAMINATION

## 2016-11-03 LAB — BLADDER SCAN AMB NON-IMAGING: Scan Result: 12

## 2016-11-03 NOTE — Progress Notes (Signed)
11/03/2016 11:23 AM   Era Bumpers 03/03/1921 [id number]  Referring provider: Dion Body, MD [place] [facility] [place], [place] [address]  Chief Complaint  Patient presents with  . Urinary Retention  . Hematuria    HPI: The gentleman was assessed in the emergency room on 09/29/2016 for blood in the urine it appeared to be high-volume soaking pads 24 hours. He was treated with a 16 [place] coud catheter and some bladder irrigation to clear. Serum creatinine was 1.46. He was given IV antibiotics and sent home on Keflex. He did not have a CT scan. He did have a positive urine culture resistant to Keflex.   The patient had a kidney stone years ago but has never had previous GU surgery. He does not get bladder infections. He normally voids every 2 or 3 hours and gets up twice at night. He wears 1 pad per day. He does not take bladder medication. The catheter urine is now clear  He has a long smoking history. He takes daily aspirin but no blood thinners  The patient was started on ciprofloxacin for an Escherichia coli UTI which was thought to be the reason for his hematuria. He presents today for trial of void.  The plan has been told he undergo cystoscopy if his hematuria does not resolve after treatment of his retention and UTI.  However on follow-up today, his hematuria has resolved with a normal urinalysis. His retention is also resolved with PVR 16 cc today. The patient has no urinary complaints at this time. He is voiding well with a good stream. He feels that his bladder is empty.   PMH: Past Medical History:  Diagnosis Date  . Aortic atherosclerosis ([place]) 03/01/2016  . Arthritis   . High cholesterol   . Hypertension   . TIA (transient ischemic attack)     Surgical History: Past Surgical History:  Procedure Laterality Date  . LEG SURGERY    . TOTAL HIP ARTHROPLASTY Bilateral     Home Medications:  Allergies as of 11/03/2016    Reactions   Morphine And Related    Tramadol Nausea Only   Other reaction(s): Vomiting      Medication List       Accurate as of 11/03/16 11:23 AM. Always use your most recent med list.          amLODipine 5 MG tablet Commonly known as:  NORVASC Take 5 mg by mouth daily.   aspirin EC 325 MG tablet Take 81 mg by mouth daily.   atorvastatin 10 MG tablet Commonly known as:  LIPITOR Take 10 mg by mouth daily.   FISH OIL PO Take 1 capsule by mouth daily.   hydrALAZINE 25 MG tablet Commonly known as:  APRESOLINE Take 25 mg by mouth 3 (three) times daily.   Melatonin 10 MG Tabs Take by mouth.       Allergies:  Allergies  Allergen Reactions  . Morphine And Related   . Tramadol Nausea Only    Other reaction(s): Vomiting    Family History: Family History  Problem Relation Age of Onset  . Prostate cancer Neg Hx   . Bladder Cancer Neg Hx   . Kidney cancer Neg Hx     Social History:  reports that he has quit smoking. He has never used smokeless tobacco. He reports that he drinks alcohol. He reports that he does not use drugs.  ROS: UROLOGY Frequent Urination?: No Hard to postpone urination?: No Burning/pain  with urination?: No Get up at night to urinate?: Yes Leakage of urine?: No Urine stream starts and stops?: No Trouble starting stream?: No Do you have to strain to urinate?: No Blood in urine?: No Urinary tract infection?: No Sexually transmitted disease?: No Injury to kidneys or bladder?: No Painful intercourse?: No Weak stream?: No Erection problems?: Yes Penile pain?: No  Gastrointestinal Nausea?: No Vomiting?: No Indigestion/heartburn?: No Diarrhea?: No Constipation?: No  Constitutional Fever: No Night sweats?: No Weight loss?: No Fatigue?: No  Skin Skin rash/lesions?: No Itching?: No  Eyes Blurred vision?: No Double vision?: No  Ears/Nose/Throat Sore throat?: No Sinus problems?: Yes  Hematologic/Lymphatic Swollen glands?:  No Easy bruising?: No  Cardiovascular Leg swelling?: No Chest pain?: No  Respiratory Cough?: No Shortness of breath?: No  Endocrine Excessive thirst?: No  Musculoskeletal Back pain?: No Joint pain?: No  Neurological Headaches?: No Dizziness?: No  Psychologic Depression?: No Anxiety?: No  Physical Exam: BP 122/65 (BP Location: Left Arm, Patient Position: Sitting, Cuff Size: Normal)   Pulse 69   Ht 6' (1.829 m)   Wt 212 lb (96.2 kg)   BMI 28.75 kg/m   Constitutional:  Alert and oriented, No acute distress. HEENT: [place] AT, moist mucus membranes.  Trachea midline, no masses. Cardiovascular: No clubbing, cyanosis, or edema. Respiratory: Normal respiratory effort, no increased work of breathing. GI: Abdomen is soft, nontender, nondistended, no abdominal masses GU: No CVA tenderness.  Skin: No rashes, bruises or suspicious lesions. Lymph: No cervical or inguinal adenopathy. Neurologic: Grossly intact, no focal deficits, moving all 4 extremities. Psychiatric: Normal mood and affect.  Laboratory Data: Lab Results  Component Value Date   WBC 10.4 09/29/2016   HGB 14.1 09/29/2016   HCT 41.8 09/29/2016   MCV 94.1 09/29/2016   PLT 191 09/29/2016    Lab Results  Component Value Date   CREATININE 1.46 (H) 09/29/2016    No results found for: PSA  No results found for: TESTOSTERONE  No results found for: HGBA1C  Urinalysis    Component Value Date/Time   COLORURINE AMBER (A) 09/29/2016 1240   APPEARANCEUR HAZY (A) 09/29/2016 1240   LABSPEC 1.011 09/29/2016 1240   PHURINE 5.0 09/29/2016 1240   GLUCOSEU NEGATIVE 09/29/2016 1240   HGBUR LARGE (A) 09/29/2016 1240   BILIRUBINUR NEGATIVE 09/29/2016 1240   KETONESUR NEGATIVE 09/29/2016 1240   PROTEINUR 100 (A) 09/29/2016 1240   NITRITE NEGATIVE 09/29/2016 1240   LEUKOCYTESUR TRACE (A) 09/29/2016 1240     Assessment & Plan:    1. Gross hematuria 2. UTI Gross hematuria and urinary retention resolved after  treating his urinary tract infection. He'll follow-up with [place] of his gross hematuria returns.  No Follow-up on file.  Nickie Retort, MD  [facility] [address], [place], [place] [address] [phone]

## 2016-11-08 DIAGNOSIS — N183 Chronic kidney disease, stage 3 unspecified: Secondary | ICD-10-CM | POA: Insufficient documentation

## 2017-02-01 ENCOUNTER — Emergency Department: Payer: Medicare Other

## 2017-02-01 ENCOUNTER — Inpatient Hospital Stay
Admission: EM | Admit: 2017-02-01 | Discharge: 2017-02-03 | DRG: 871 | Disposition: A | Discharge status: Home Health | Payer: Medicare Other | Attending: Internal Medicine | Admitting: Internal Medicine

## 2017-02-01 DIAGNOSIS — A419 Sepsis, unspecified organism: Principal | ICD-10-CM | POA: Diagnosis present

## 2017-02-01 DIAGNOSIS — I129 Hypertensive chronic kidney disease with stage 1 through stage 4 chronic kidney disease, or unspecified chronic kidney disease: Secondary | ICD-10-CM | POA: Diagnosis present

## 2017-02-01 DIAGNOSIS — J9601 Acute respiratory failure with hypoxia: Secondary | ICD-10-CM | POA: Diagnosis present

## 2017-02-01 DIAGNOSIS — Z7982 Long term (current) use of aspirin: Secondary | ICD-10-CM

## 2017-02-01 DIAGNOSIS — I10 Essential (primary) hypertension: Secondary | ICD-10-CM | POA: Diagnosis present

## 2017-02-01 DIAGNOSIS — Z96643 Presence of artificial hip joint, bilateral: Secondary | ICD-10-CM | POA: Diagnosis present

## 2017-02-01 DIAGNOSIS — N183 Chronic kidney disease, stage 3 (moderate): Secondary | ICD-10-CM | POA: Diagnosis present

## 2017-02-01 DIAGNOSIS — J189 Pneumonia, unspecified organism: Secondary | ICD-10-CM | POA: Diagnosis present

## 2017-02-01 DIAGNOSIS — R531 Weakness: Secondary | ICD-10-CM | POA: Diagnosis not present

## 2017-02-01 DIAGNOSIS — Z87891 Personal history of nicotine dependence: Secondary | ICD-10-CM | POA: Diagnosis not present

## 2017-02-01 DIAGNOSIS — Z8673 Personal history of transient ischemic attack (TIA), and cerebral infarction without residual deficits: Secondary | ICD-10-CM | POA: Diagnosis not present

## 2017-02-01 DIAGNOSIS — R6520 Severe sepsis without septic shock: Secondary | ICD-10-CM | POA: Diagnosis present

## 2017-02-01 DIAGNOSIS — E785 Hyperlipidemia, unspecified: Secondary | ICD-10-CM | POA: Diagnosis present

## 2017-02-01 DIAGNOSIS — J181 Lobar pneumonia, unspecified organism: Secondary | ICD-10-CM

## 2017-02-01 DIAGNOSIS — I509 Heart failure, unspecified: Secondary | ICD-10-CM

## 2017-02-01 DIAGNOSIS — Y95 Nosocomial condition: Secondary | ICD-10-CM | POA: Diagnosis present

## 2017-02-01 DIAGNOSIS — N179 Acute kidney failure, unspecified: Secondary | ICD-10-CM | POA: Diagnosis present

## 2017-02-01 DIAGNOSIS — E7800 Pure hypercholesterolemia, unspecified: Secondary | ICD-10-CM | POA: Diagnosis present

## 2017-02-01 DIAGNOSIS — N189 Chronic kidney disease, unspecified: Secondary | ICD-10-CM

## 2017-02-01 LAB — COMPREHENSIVE METABOLIC PANEL
ALBUMIN: 3.8 g/dL (ref 3.5–5.0)
ALT: 15 U/L — AB (ref 17–63)
AST: 28 U/L (ref 15–41)
Alkaline Phosphatase: 59 U/L (ref 38–126)
Anion gap: 10 (ref 5–15)
BUN: 27 mg/dL — AB (ref 6–20)
CHLORIDE: 108 mmol/L (ref 101–111)
CO2: 21 mmol/L — AB (ref 22–32)
CREATININE: 2.1 mg/dL — AB (ref 0.61–1.24)
Calcium: 9.5 mg/dL (ref 8.9–10.3)
GFR calc non Af Amer: 26 mL/min — ABNORMAL LOW (ref >60)
GFR, EST AFRICAN AMERICAN: 30 mL/min — AB (ref >60)
GLUCOSE: 137 mg/dL — AB (ref 65–99)
Potassium: 4 mmol/L (ref 3.5–5.1)
SODIUM: 139 mmol/L (ref 135–145)
Total Bilirubin: 1.5 mg/dL — ABNORMAL HIGH (ref 0.3–1.2)
Total Protein: 7 g/dL (ref 6.5–8.1)

## 2017-02-01 LAB — LACTIC ACID, PLASMA: Lactic Acid, Venous: 2.2 mmol/L (ref 0.5–1.9)

## 2017-02-01 LAB — URINALYSIS, COMPLETE (UACMP) WITH MICROSCOPIC
BILIRUBIN URINE: NEGATIVE
Bacteria, UA: NONE SEEN
Glucose, UA: NEGATIVE mg/dL
KETONES UR: 5 mg/dL — AB
LEUKOCYTES UA: NEGATIVE
Nitrite: NEGATIVE
PROTEIN: 100 mg/dL — AB
SPECIFIC GRAVITY, URINE: 1.018 (ref 1.005–1.030)
SQUAMOUS EPITHELIAL / LPF: NONE SEEN
pH: 5 (ref 5.0–8.0)

## 2017-02-01 LAB — BLOOD GAS, VENOUS
Acid-base deficit: 3 mmol/L — ABNORMAL HIGH (ref 0.0–2.0)
BICARBONATE: 21.1 mmol/L (ref 20.0–28.0)
O2 SAT: 77.5 %
PATIENT TEMPERATURE: 37
PCO2 VEN: 34 mmHg — AB (ref 44.0–60.0)
PH VEN: 7.4 (ref 7.250–7.430)
PO2 VEN: 42 mmHg (ref 32.0–45.0)

## 2017-02-01 LAB — CBC WITH DIFFERENTIAL/PLATELET
BASOS ABS: 0 10*3/uL (ref 0–0.1)
Basophils Relative: 0 %
Eosinophils Absolute: 0 10*3/uL (ref 0–0.7)
Eosinophils Relative: 0 %
HEMATOCRIT: 43.6 % (ref 40.0–52.0)
Hemoglobin: 14.5 g/dL (ref 13.0–18.0)
LYMPHS PCT: 2 %
Lymphs Abs: 0.5 10*3/uL — ABNORMAL LOW (ref 1.0–3.6)
MCH: 31.2 pg (ref 26.0–34.0)
MCHC: 33.2 g/dL (ref 32.0–36.0)
MCV: 94 fL (ref 80.0–100.0)
MONO ABS: 1.3 10*3/uL — AB (ref 0.2–1.0)
MONOS PCT: 6 %
NEUTROS ABS: 20.6 10*3/uL — AB (ref 1.4–6.5)
Neutrophils Relative %: 92 %
Platelets: 164 10*3/uL (ref 150–440)
RBC: 4.64 MIL/uL (ref 4.40–5.90)
RDW: 16.8 % — AB (ref 11.5–14.5)
WBC: 22.4 10*3/uL — ABNORMAL HIGH (ref 3.8–10.6)

## 2017-02-01 LAB — TROPONIN I: TROPONIN I: 0.04 ng/mL — AB (ref <0.03)

## 2017-02-01 LAB — INFLUENZA PANEL BY PCR (TYPE A & B)
INFLBPCR: NEGATIVE
Influenza A By PCR: NEGATIVE

## 2017-02-01 MED ORDER — AZITHROMYCIN 500 MG IV SOLR
500.0000 mg | Freq: Once | INTRAVENOUS | Status: AC
  Administered 2017-02-01: 500 mg via INTRAVENOUS
  Filled 2017-02-01: qty 500

## 2017-02-01 MED ORDER — AMLODIPINE BESYLATE 5 MG PO TABS
5.0000 mg | ORAL_TABLET | Freq: Every day | ORAL | Status: DC
  Administered 2017-02-02 – 2017-02-03 (×2): 5 mg via ORAL
  Filled 2017-02-01 (×2): qty 1

## 2017-02-01 MED ORDER — ASPIRIN EC 81 MG PO TBEC
81.0000 mg | DELAYED_RELEASE_TABLET | Freq: Every day | ORAL | Status: DC
  Administered 2017-02-02 – 2017-02-03 (×2): 81 mg via ORAL
  Filled 2017-02-01 (×2): qty 1

## 2017-02-01 MED ORDER — VANCOMYCIN HCL 10 G IV SOLR
1500.0000 mg | INTRAVENOUS | Status: DC
  Administered 2017-02-02: 1500 mg via INTRAVENOUS
  Filled 2017-02-01: qty 1500

## 2017-02-01 MED ORDER — DEXTROSE 5 % IV SOLN
2.0000 g | INTRAVENOUS | Status: DC
  Administered 2017-02-02: 10:00:00 via INTRAVENOUS
  Filled 2017-02-01: qty 2

## 2017-02-01 MED ORDER — VANCOMYCIN HCL IN DEXTROSE 1-5 GM/200ML-% IV SOLN
1000.0000 mg | Freq: Once | INTRAVENOUS | Status: AC
  Administered 2017-02-01: 1000 mg via INTRAVENOUS
  Filled 2017-02-01: qty 200

## 2017-02-01 MED ORDER — SODIUM CHLORIDE 0.9 % IV SOLN
INTRAVENOUS | Status: DC
  Administered 2017-02-02: 01:00:00 via INTRAVENOUS

## 2017-02-01 MED ORDER — ONDANSETRON HCL 4 MG/2ML IJ SOLN: 4.0000 mg | Freq: Four times a day (QID) | INTRAMUSCULAR | Status: DC | PRN

## 2017-02-01 MED ORDER — GUAIFENESIN-DM 100-10 MG/5ML PO SYRP
5.0000 mL | ORAL_SOLUTION | ORAL | Status: DC | PRN
  Filled 2017-02-01: qty 5

## 2017-02-01 MED ORDER — HEPARIN SODIUM (PORCINE) 5000 UNIT/ML IJ SOLN
5000.0000 [IU] | Freq: Three times a day (TID) | INTRAMUSCULAR | Status: DC
  Administered 2017-02-02: 05:00:00 5000 [IU] via SUBCUTANEOUS
  Filled 2017-02-01: qty 1

## 2017-02-01 MED ORDER — DEXTROSE 5 % IV SOLN: 1.0000 g | INTRAVENOUS | Status: DC

## 2017-02-01 MED ORDER — SODIUM CHLORIDE 0.9 % IV BOLUS (SEPSIS)
1000.0000 mL | Freq: Once | INTRAVENOUS | Status: AC
  Administered 2017-02-01: 1000 mL via INTRAVENOUS

## 2017-02-01 MED ORDER — ACETAMINOPHEN 325 MG PO TABS: 650.0000 mg | ORAL_TABLET | Freq: Four times a day (QID) | ORAL | Status: DC | PRN

## 2017-02-01 MED ORDER — ACETAMINOPHEN 650 MG RE SUPP: 650.0000 mg | Freq: Four times a day (QID) | RECTAL | Status: DC | PRN

## 2017-02-01 MED ORDER — MELATONIN 5 MG PO TABS
10.0000 mg | ORAL_TABLET | Freq: Every evening | ORAL | Status: DC | PRN
  Filled 2017-02-01: qty 2

## 2017-02-01 MED ORDER — HYDRALAZINE HCL 50 MG PO TABS
25.0000 mg | ORAL_TABLET | Freq: Three times a day (TID) | ORAL | Status: DC
  Administered 2017-02-02 – 2017-02-03 (×5): 25 mg via ORAL
  Filled 2017-02-01 (×5): qty 1

## 2017-02-01 MED ORDER — CEFEPIME HCL 1 G IJ SOLR
1.0000 g | Freq: Once | INTRAMUSCULAR | Status: AC
  Administered 2017-02-01: 1 g via INTRAVENOUS
  Filled 2017-02-01: qty 1

## 2017-02-01 MED ORDER — ATORVASTATIN CALCIUM 20 MG PO TABS
10.0000 mg | ORAL_TABLET | Freq: Every day | ORAL | Status: DC
Start: 2017-02-02 — End: 2017-02-03
  Administered 2017-02-02 – 2017-02-03 (×2): 10 mg via ORAL
  Filled 2017-02-01 (×2): qty 1

## 2017-02-01 MED ORDER — ONDANSETRON HCL 4 MG PO TABS: 4.0000 mg | ORAL_TABLET | Freq: Four times a day (QID) | ORAL | Status: DC | PRN

## 2017-02-01 MED ORDER — IPRATROPIUM-ALBUTEROL 0.5-2.5 (3) MG/3ML IN SOLN: 3.0000 mL | RESPIRATORY_TRACT | Status: DC | PRN

## 2017-02-01 NOTE — ED Notes (Signed)
Patient transported to X-ray 

## 2017-02-01 NOTE — ED Notes (Signed)
Pt presents today post fall. Pt denies any pain. Pt is has cough that is productive. ED Provider at bedside

## 2017-02-01 NOTE — ED Notes (Signed)
Date and time results received: 02/01/17 1955  Test: Troponin Critical Value: 0.04  Test:  Lactic Acid Critical Value:  2.2  Name of Provider Notified: Alfred Levins  Orders Received? Or Actions Taken?: None

## 2017-02-01 NOTE — H&P (Signed)
[name] NAME: Robert [name]    MR#:  [id number]  DATE OF BIRTH:  21-Aug-1920  DATE OF ADMISSION:  02/01/2017  PRIMARY CARE PHYSICIAN: Dion Body, MD   REQUESTING/REFERRING PHYSICIAN: Alfred Levins, MD  CHIEF COMPLAINT:   Chief Complaint  Patient presents with  . Fall    HISTORY OF PRESENT ILLNESS:  Robert [name]  is a 83 y.o. male who presents with productive cough, weakness, loss of appetite over the last couple days. Here in the ED he was found to meet sepsis criteria, has pneumonia on x-ray. He was treated with sepsis protocol or HCAP and hospitalists were called for admission  PAST MEDICAL HISTORY:   Past Medical History:  Diagnosis Date  . Aortic atherosclerosis ([place]) 03/01/2016  . Arthritis   . High cholesterol   . Hypertension   . TIA (transient ischemic attack)     PAST SURGICAL HISTORY:   Past Surgical History:  Procedure Laterality Date  . LEG SURGERY    . TOTAL HIP ARTHROPLASTY Bilateral     SOCIAL HISTORY:   Social History  Substance Use Topics  . Smoking status: Former Research scientist (life sciences)  . Smokeless tobacco: Never Used  . Alcohol use Yes    FAMILY HISTORY:   Family History  Problem Relation Age of Onset  . Prostate cancer Neg Hx   . Bladder Cancer Neg Hx   . Kidney cancer Neg Hx     DRUG ALLERGIES:   Allergies  Allergen Reactions  . Morphine And Related   . Tramadol Nausea Only    Other reaction(s): Vomiting    MEDICATIONS AT HOME:   Prior to Admission medications   Medication Sig Start Date End Date Taking? Authorizing Provider  amLODipine (NORVASC) 5 MG tablet Take 5 mg by mouth daily.   Yes [provider]  aspirin EC 325 MG tablet Take 81 mg by mouth daily.   Yes [provider]  atorvastatin (LIPITOR) 10 MG tablet Take 10 mg by mouth daily.   Yes [provider]  hydrALAZINE (APRESOLINE) 25 MG tablet Take 25 mg by mouth 3 (three) times daily.    Yes [provider]  Melatonin 10 MG TABS Take 10 mg by mouth at bedtime as needed (Sleep).    Yes [provider]  Omega-3 Fatty Acids (FISH OIL PO) Take 1 capsule by mouth daily.   Yes [provider]    REVIEW OF SYSTEMS:  Review of Systems  Constitutional: Negative for chills, fever, malaise/fatigue and weight loss.  HENT: Negative for ear pain, hearing loss and tinnitus.   Eyes: Negative for blurred vision, double vision, pain and redness.  Respiratory: Positive for cough and sputum production. Negative for hemoptysis and shortness of breath.   Cardiovascular: Negative for chest pain, palpitations, orthopnea and leg swelling.  Gastrointestinal: Negative for abdominal pain, constipation, diarrhea, nausea and vomiting.  Genitourinary: Negative for dysuria, frequency and hematuria.  Musculoskeletal: Negative for back pain, joint pain and neck pain.  Skin:       No acne, rash, or lesions  Neurological: Negative for dizziness, tremors, focal weakness and weakness.  Endo/Heme/Allergies: Negative for polydipsia. Does not bruise/bleed easily.  Psychiatric/Behavioral: Negative for depression. The patient is not nervous/anxious and does not have insomnia.      VITAL SIGNS:   Vitals:   02/01/17 1854  BP: (!) 152/100  Temp: 98.8 F (37.1 C)  TempSrc: Oral  SpO2: 100%   Wt Readings from  Last 3 Encounters:  11/03/16 96.2 kg (212 lb)  10/06/16 94.8 kg (209 lb)  10/03/16 95.3 kg (210 lb)    PHYSICAL EXAMINATION:  Physical Exam  Vitals reviewed. Constitutional: He is oriented to person, place, and time. He appears well-developed and well-nourished. No distress.  HENT:  Head: Normocephalic and atraumatic.  Mouth/Throat: Oropharynx is clear and moist.  Eyes: Pupils are equal, round, and reactive to light. Conjunctivae and EOM are normal. No scleral icterus.  Neck: Normal range of motion. Neck supple. No JVD present. No thyromegaly present.  Cardiovascular:  Normal rate, regular rhythm and intact distal pulses.  Exam reveals no gallop and no friction rub.   No murmur heard. Respiratory: Effort normal. No respiratory distress. He has no wheezes. He has no rales.  Right greater than left basilar rhonchi  GI: Soft. Bowel sounds are normal. He exhibits no distension. There is no tenderness.  Musculoskeletal: Normal range of motion. He exhibits no edema.  No arthritis, no gout  Lymphadenopathy:    He has no cervical adenopathy.  Neurological: He is alert and oriented to person, place, and time. No cranial nerve deficit.  No dysarthria, no aphasia  Skin: Skin is warm and dry. No rash noted. No erythema.  Psychiatric: He has a normal mood and affect. His behavior is normal. Judgment and thought content normal.    LABORATORY PANEL:   CBC  Recent Labs Lab 02/01/17 1852  WBC 22.4*  HGB 14.5  HCT 43.6  PLT 164   ------------------------------------------------------------------------------------------------------------------  Chemistries   Recent Labs Lab 02/01/17 1852  NA 139  K 4.0  CL 108  CO2 21*  GLUCOSE 137*  BUN 27*  CREATININE 2.10*  CALCIUM 9.5  AST 28  ALT 15*  ALKPHOS 14  BILITOT 1.5*   ------------------------------------------------------------------------------------------------------------------  Cardiac Enzymes  Recent Labs Lab 02/01/17 1852  TROPONINI 0.04*   ------------------------------------------------------------------------------------------------------------------  RADIOLOGY:  Dg Chest 2 View  Result Date: 02/01/2017 CLINICAL DATA:  Recent fall with productive cough EXAM: CHEST  2 VIEW COMPARISON:  None. FINDINGS: Cardiac shadow is enlarged. Aortic calcifications are noted. The lungs are well aerated bilaterally. Patchy bibasilar atelectatic changes are noted left greater than right. Degenerative changes of the thoracic spine are seen. No acute bony abnormality is noted. IMPRESSION: Bibasilar  atelectatic changes. Electronically Signed   By: Inez Catalina M.D.   On: 02/01/2017 19:29   Ct Head Wo Contrast  Result Date: 02/01/2017 CLINICAL DATA:  Fall.  Head trauma EXAM: CT HEAD WITHOUT CONTRAST TECHNIQUE: Contiguous axial images were obtained from the base of the skull through the vertex without intravenous contrast. COMPARISON:  None. FINDINGS: Brain: Moderate to advanced atrophy. White matter low attenuation most consistent with chronic microvascular ischemia. Chronic infarct right parietal lobe. Negative for acute infarct, hemorrhage, or mass. Vascular: Negative for hyperdense vessel. Atherosclerotic calcification. Skull: Negative Sinuses/Orbits: Negative Other: None IMPRESSION: Atrophy and chronic ischemia.  No acute abnormality. Electronically Signed   By: Franchot Gallo M.D.   On: 02/01/2017 19:17    EKG:   Orders placed or performed during the hospital encounter of 02/01/17  . ED EKG  . ED EKG    IMPRESSION AND PLAN:  Principal Problem:   Sepsis ([place]) - broad-spectrum IV antibiotics, lactic acid barely elevated but we will use IV fluids and trend his lactate until within normal limits, he is hemodynamically stable, cultures sent from the ED Active Problems:   HCAP (healthcare-associated pneumonia) - antibiotics and cultures as above, other supportive treatment when  necessary   AKI (acute kidney injury) ([place]) - fluids as above, avoid nephrotoxins and monitor for improvement   Essential hypertension with goal blood pressure less than 140/90 - continue home meds   Pure hypercholesterolemia - home medications  All the records are reviewed and case discussed with ED provider. Management plans discussed with the patient and/or family.  DVT PROPHYLAXIS: SubQ heparin  GI PROPHYLAXIS: None  ADMISSION STATUS: Inpatient  CODE STATUS: Full Code Status History    This patient does not have a recorded code status. Please follow your organizational policy for patients in this  situation.      TOTAL TIME TAKING CARE OF THIS PATIENT: 45 minutes.   Kegan Shepardson Tom Green 02/01/2017, 9:08 PM  [employer]  [phone]  CC: Primary care physician; Dion Body, MD  Note:  This document was prepared using Dragon voice recognition software and may include unintentional dictation errors.

## 2017-02-01 NOTE — ED Provider Notes (Addendum)
[facility] Emergency Department Provider Note  ____________________________________________  Time seen: Approximately 7:00 PM  I have reviewed the triage vital signs and the nursing notes.   HISTORY  Chief Complaint Fall   HPI KALUM MINNER is a 83 y.o. male with a history of hypertension, hyperlipidemia, CVA who presents for evaluation after mechanical fall. Patient reports that he was trying to get up from a rocking chair when he lost his balance and fell. He hit his head on the ground. No LOC. No blood thinners. Patient denies any pain at this time including headache, neck pain, back pain, chest pain, abdominal pain, or any extremity pain. According to the nursing home patient has had progressively worsening weakness for the last 5 days. He has a productive cough. Patient denies shortness of breath but has obvious increased WOB and is hypoxic on RA. He denies chills, fever, nausea, vomiting, diarrhea.   Past Medical History:  Diagnosis Date  . Aortic atherosclerosis ([place]) 03/01/2016  . Arthritis   . High cholesterol   . Hypertension   . TIA (transient ischemic attack)     Patient Active Problem List   Diagnosis Date Noted  . Essential hypertension with goal blood pressure less than 140/90 10/03/2016  . History of CVA (cerebrovascular accident) 10/03/2016  . Pure hypercholesterolemia 10/03/2016  . Other male erectile dysfunction 06/27/2016  . Aortic atherosclerosis ([place]) 03/01/2016  . Vaccine counseling 12/30/2015    Past Surgical History:  Procedure Laterality Date  . LEG SURGERY    . TOTAL HIP ARTHROPLASTY Bilateral     Prior to Admission medications   Medication Sig Start Date End Date Taking? Authorizing Provider  amLODipine (NORVASC) 5 MG tablet Take 5 mg by mouth daily.   Yes [provider]  aspirin EC 325 MG tablet Take 81 mg by mouth daily.   Yes [provider]  atorvastatin (LIPITOR) 10 MG tablet Take 10 mg by  mouth daily.   Yes [provider]  hydrALAZINE (APRESOLINE) 25 MG tablet Take 25 mg by mouth 3 (three) times daily.   Yes [provider]  Melatonin 10 MG TABS Take 10 mg by mouth at bedtime as needed (Sleep).    Yes [provider]  Omega-3 Fatty Acids (FISH OIL PO) Take 1 capsule by mouth daily.   Yes [provider]    Allergies Morphine and related and Tramadol  Family History  Problem Relation Age of Onset  . Prostate cancer Neg Hx   . Bladder Cancer Neg Hx   . Kidney cancer Neg Hx     Social History Social History  Substance Use Topics  . Smoking status: Former Research scientist (life sciences)  . Smokeless tobacco: Never Used  . Alcohol use Yes    Review of Systems  Constitutional: Negative for fever. + generalized weakness Eyes: Negative for visual changes. ENT: Negative for sore throat. Neck: No neck pain  Cardiovascular: Negative for chest pain. Respiratory: + shortness of breath, cough Gastrointestinal: Negative for abdominal pain, vomiting or diarrhea. Genitourinary: Negative for dysuria. Musculoskeletal: Negative for back pain. Skin: Negative for rash. Neurological: Negative for headaches, weakness or numbness. Psych: No SI or HI  ____________________________________________   PHYSICAL EXAM:  VITAL SIGNS: Vitals:   02/01/17 1854  BP: (!) 152/100  Temp: 98.8 F (37.1 C)  SpO2: 100%    Constitutional: Alert and oriented, mild respiratory distress.  HEENT:      Head: Normocephalic and atraumatic.  Eyes: Conjunctivae are normal. Sclera is non-icteric.       Mouth/Throat: Mucous membranes are moist.       Neck: Supple with no signs of meningismus. Cardiovascular: tachycardic rate with a regular rhythm. No murmurs, gallops, or rubs. 2+ symmetrical distal pulses are present in all extremities. No JVD. Respiratory: increased work of breathing, hypoxic on room air, decreased air movement bilaterally with no wheezing or  crackles Gastrointestinal: Soft, non tender, and non distended with positive bowel sounds. No rebound or guarding. Musculoskeletal: Nontender with normal range of motion in all extremities. No edema, cyanosis, or erythema of extremities.no CT and L-spine tenderness, pelvis is stable. Neurologic: Normal speech and language. Face is symmetric. Moving all extremities. No gross focal neurologic deficits are appreciated. Skin: Skin is warm, dry and intact. No rash noted. Psychiatric: Mood and affect are normal. Speech and behavior are normal.  ____________________________________________   LABS (all labs ordered are listed, but only abnormal results are displayed)  Labs Reviewed  CBC WITH DIFFERENTIAL/PLATELET - Abnormal; Notable for the following:       Result Value   WBC 22.4 (*)    RDW 16.8 (*)    Neutro Abs 20.6 (*)    Lymphs Abs 0.5 (*)    Monocytes Absolute 1.3 (*)    All other components within normal limits  COMPREHENSIVE METABOLIC PANEL - Abnormal; Notable for the following:    CO2 21 (*)    Glucose, Bld 137 (*)    BUN 27 (*)    Creatinine, Ser 2.10 (*)    ALT 15 (*)    Total Bilirubin 1.5 (*)    GFR calc non Af Amer 26 (*)    GFR calc Af Amer 30 (*)    All other components within normal limits  LACTIC ACID, PLASMA - Abnormal; Notable for the following:    Lactic Acid, Venous 2.2 (*)    All other components within normal limits  TROPONIN I - Abnormal; Notable for the following:    Troponin I 0.04 (*)    All other components within normal limits  URINE CULTURE  CULTURE, BLOOD (ROUTINE X 2)  CULTURE, BLOOD (ROUTINE X 2)  BLOOD GAS, VENOUS  URINALYSIS, COMPLETE (UACMP) WITH MICROSCOPIC  INFLUENZA PANEL BY PCR (TYPE A & B)   ____________________________________________  EKG  ED ECG REPORT I, Rudene Re, the attending physician, personally viewed and interpreted this ECG.  Sinus tachycardia with PVCs, rate of 102, normal QTC, normal axis, no ST elevations or  depressions. no prior for comparison ____________________________________________  RADIOLOGY  Head CT: Atrophy and chronic ischemia. No acute abnormality.  CXR: LLB and retrocardiac infiltrate ____________________________________________   PROCEDURES  Procedure(s) performed: None Procedures Critical Care performed: yes  CRITICAL CARE Performed by: Rudene Re  ?  Total critical care time: 35 min  Critical care time was exclusive of separately billable procedures and treating other patients.  Critical care was necessary to treat or prevent imminent or life-threatening deterioration.  Critical care was time spent personally by me on the following activities: development of treatment plan with patient and/or surrogate as well as nursing, discussions with consultants, evaluation of patient's response to treatment, examination of patient, obtaining history from patient or surrogate, ordering and performing treatments and interventions, ordering and review of laboratory studies, ordering and review of radiographic studies, pulse oximetry and re-evaluation of patient's condition.  ____________________________________________   INITIAL IMPRESSION / ASSESSMENT AND PLAN / ED COURSE  83 y.o. male with a history of hypertension,  hyperlipidemia, CVA who presents for evaluation after mechanical fallWhile trying to get up from a rocking chair in the setting of 5 days of progressively worsening weakness and a productive cough. Patient has increased work of breathing, hypoxic on room air with diminished breath sounds bilaterally, no crackles or wheezing. Patient is afebrile. EKG showing new atrial fibrillation with ventricular rate of 102. patient does not look volume overloaded on exam. No injuries based on exam. Plan for head CT and sepsis labs to rule out PNA vs viral URI vs pulmonary edema. Patient placed on 2 L [place].    _________________________ 8:04 PM on  02/01/2017 -----------------------------------------  chest x-ray concerning for left lower lobe/retrocardiac infiltrate, white count of 22K, lactate of 2.2 and U oxygen requirement concerning for sepsis from pneumonia. Since patient lives in a nursing home he will be treated with cefepime, vancomycin, and azithromycin. 2 L of normal saline have been ordered. Patient also with acute kidney injury with a creatinine of 2.1 (baseline is 1.4). Patient is going to be admitted to the hospitalist service.   As part of my medical decision making, I reviewed the following data within the [place] History obtained from family, Nursing notes reviewed and incorporated, Labs reviewed , EKG interpreted , Old EKG reviewed, Old chart reviewed, Discussed with admitting physician , Notes from prior ED visits and [place] Controlled Substance Database    Pertinent labs & imaging results that were available during my care of the patient were reviewed by me and considered in my medical decision making (see chart for details).    ____________________________________________   FINAL CLINICAL IMPRESSION(S) / ED DIAGNOSES  Final diagnoses:  Sepsis, due to unspecified organism [facility])  Community acquired pneumonia of left lower lobe of lung ([place])  AKI (acute kidney injury) ([place])      NEW MEDICATIONS STARTED DURING THIS VISIT:  New Prescriptions   No medications on file     Note:  This document was prepared using Dragon voice recognition software and may include unintentional dictation errors.    Rudene Re, MD 02/01/17 Brendolyn Patty, [place], MD 02/01/17 2006

## 2017-02-01 NOTE — Progress Notes (Signed)
Pharmacy Antibiotic Note  Robert [name] is a 83 y.o. male admitted on 02/01/2017 with pneumonia and sepsis.  Pharmacy has been consulted for vancomycin and cefepime dosing.  Plan: DW 100 kg  Vd 70 L kei 0.027 hr-1  T1/2 26 hours Vancomycin 1500 mg q 36 hours ordered. Not candidate for stacked dosing. Level before 4th dose.Goal trough 15-20.  Cefepime 2 grams q 24 hours ordered.  Weight: 221 lb (100.2 kg)  Temp (24hrs), Avg:98.6 F (37 C), Min:98.3 F (36.8 C), Max:98.8 F (37.1 C)   Recent Labs Lab 02/01/17 1852 02/01/17 1853  WBC 22.4*  --   CREATININE 2.10*  --   LATICACIDVEN  --  2.2*    Estimated Creatinine Clearance: 26.9 mL/min (A) (by C-G formula based on SCr of 2.1 mg/dL (H)).    Allergies  Allergen Reactions  . Morphine And Related   . Tramadol Nausea Only    Other reaction(s): Vomiting    Antimicrobials this admission: Vancomycin, cefepime  >>    >>   Dose adjustments this admission:   Microbiology results: 10/17 BCx: pendong 10/17 UCx: pending       10/17 UA: (-) Thank you for allowing pharmacy to be a part of this patient's care.  Saree Krogh S 02/01/2017 11:59 PM

## 2017-02-02 LAB — BASIC METABOLIC PANEL
ANION GAP: 5 (ref 5–15)
BUN: 24 mg/dL — ABNORMAL HIGH (ref 6–20)
CALCIUM: 8.2 mg/dL — AB (ref 8.9–10.3)
CO2: 21 mmol/L — ABNORMAL LOW (ref 22–32)
Chloride: 113 mmol/L — ABNORMAL HIGH (ref 101–111)
Creatinine, Ser: 1.49 mg/dL — ABNORMAL HIGH (ref 0.61–1.24)
GFR, EST AFRICAN AMERICAN: 45 mL/min — AB (ref >60)
GFR, EST NON AFRICAN AMERICAN: 39 mL/min — AB (ref >60)
Glucose, Bld: 112 mg/dL — ABNORMAL HIGH (ref 65–99)
POTASSIUM: 3.6 mmol/L (ref 3.5–5.1)
SODIUM: 139 mmol/L (ref 135–145)

## 2017-02-02 LAB — CBC
HEMATOCRIT: 38.1 % — AB (ref 40.0–52.0)
HEMOGLOBIN: 12.8 g/dL — AB (ref 13.0–18.0)
MCH: 31.2 pg (ref 26.0–34.0)
MCHC: 33.5 g/dL (ref 32.0–36.0)
MCV: 93.4 fL (ref 80.0–100.0)
Platelets: 133 10*3/uL — ABNORMAL LOW (ref 150–440)
RBC: 4.08 MIL/uL — ABNORMAL LOW (ref 4.40–5.90)
RDW: 16.4 % — ABNORMAL HIGH (ref 11.5–14.5)
WBC: 15.1 10*3/uL — AB (ref 3.8–10.6)

## 2017-02-02 LAB — LACTIC ACID, PLASMA: LACTIC ACID, VENOUS: 0.9 mmol/L (ref 0.5–1.9)

## 2017-02-02 LAB — TROPONIN I: TROPONIN I: 0.03 ng/mL — AB (ref <0.03)

## 2017-02-02 MED ORDER — LEVOFLOXACIN IN D5W 750 MG/150ML IV SOLN
750.0000 mg | INTRAVENOUS | Status: DC
  Administered 2017-02-02: 750 mg via INTRAVENOUS
  Filled 2017-02-02 (×2): qty 150

## 2017-02-02 MED ORDER — HYDRALAZINE HCL 50 MG PO TABS
25.0000 mg | ORAL_TABLET | Freq: Once | ORAL | Status: AC
  Administered 2017-02-02: 03:00:00 25 mg via ORAL
  Filled 2017-02-02: qty 1

## 2017-02-02 MED ORDER — ENOXAPARIN SODIUM 40 MG/0.4ML ~~LOC~~ SOLN
40.0000 mg | SUBCUTANEOUS | Status: DC
  Administered 2017-02-02: 40 mg via SUBCUTANEOUS
  Filled 2017-02-02: qty 0.4

## 2017-02-02 MED ORDER — ORAL CARE MOUTH RINSE
15.0000 mL | Freq: Two times a day (BID) | OROMUCOSAL | Status: DC
Start: 2017-02-02 — End: 2017-02-03
  Administered 2017-02-02 – 2017-02-03 (×3): 15 mL via OROMUCOSAL

## 2017-02-02 NOTE — Progress Notes (Signed)
[place] [facility] [place] NAME: Braian Tijerina    MR#:  [id number]  DATE OF BIRTH:  [birth year]  SUBJECTIVE:  CHIEF COMPLAINT:   Chief Complaint  Patient presents with  . Fall  Cough and SOB Feels weak  REVIEW OF SYSTEMS:    Review of Systems  Constitutional: Positive for malaise/fatigue. Negative for chills and fever.  HENT: Negative for sore throat.   Eyes: Negative for blurred vision, double vision and pain.  Respiratory: Positive for cough and shortness of breath. Negative for hemoptysis and wheezing.   Cardiovascular: Negative for chest pain, palpitations, orthopnea and leg swelling.  Gastrointestinal: Negative for abdominal pain, constipation, diarrhea, heartburn, nausea and vomiting.  Genitourinary: Negative for dysuria and hematuria.  Musculoskeletal: Negative for back pain and joint pain.  Skin: Negative for rash.  Neurological: Positive for weakness. Negative for sensory change, speech change, focal weakness and headaches.  Endo/Heme/Allergies: Does not bruise/bleed easily.  Psychiatric/Behavioral: Negative for depression. The patient is not nervous/anxious.     DRUG ALLERGIES:   Allergies  Allergen Reactions  . Morphine And Related   . Tramadol Nausea Only    Other reaction(s): Vomiting    VITALS:  Blood pressure (!) 159/69, pulse 66, temperature 99.4 F (37.4 C), temperature source Oral, resp. rate 17, height 6' (1.829 m), weight 100.2 kg (221 lb), SpO2 94 %.  PHYSICAL EXAMINATION:   Physical Exam  GENERAL:  83 y.o.-year-old patient lying in the bed with no acute distress.  EYES: Pupils equal, round, reactive to light and accommodation. No scleral icterus. Extraocular muscles intact.  HEENT: Head atraumatic, normocephalic. Oropharynx and nasopharynx clear.  NECK:  Supple, no jugular venous distention. No thyroid enlargement, no tenderness.  LUNGS: Normal breath sounds bilaterally, no wheezing, rales, rhonchi. No use of  accessory muscles of respiration.  CARDIOVASCULAR: S1, S2 normal. No murmurs, rubs, or gallops.  ABDOMEN: Soft, nontender, nondistended. Bowel sounds present. No organomegaly or mass.  EXTREMITIES: No cyanosis, clubbing or edema b/l.    NEUROLOGIC: Cranial nerves II through XII are intact. No focal Motor or sensory deficits b/l.   PSYCHIATRIC: The patient is alert and oriented x 3.  SKIN: No obvious rash, lesion, or ulcer.   LABORATORY PANEL:   CBC  Recent Labs Lab 02/02/17 0408  WBC 15.1*  HGB 12.8*  HCT 38.1*  PLT 133*   ------------------------------------------------------------------------------------------------------------------ Chemistries   Recent Labs Lab 02/01/17 1852 02/02/17 0408  NA 139 139  K 4.0 3.6  CL 108 113*  CO2 21* 21*  GLUCOSE 137* 112*  BUN 27* 24*  CREATININE 2.10* 1.49*  CALCIUM 9.5 8.2*  AST 28  --   ALT 15*  --   ALKPHOS 59  --   BILITOT 1.5*  --    ------------------------------------------------------------------------------------------------------------------  Cardiac Enzymes  Recent Labs Lab 02/02/17 0408  TROPONINI 0.03*   ------------------------------------------------------------------------------------------------------------------  RADIOLOGY:  Dg Chest 2 View  Result Date: 02/01/2017 CLINICAL DATA:  Recent fall with productive cough EXAM: CHEST  2 VIEW COMPARISON:  None. FINDINGS: Cardiac shadow is enlarged. Aortic calcifications are noted. The lungs are well aerated bilaterally. Patchy bibasilar atelectatic changes are noted left greater than right. Degenerative changes of the thoracic spine are seen. No acute bony abnormality is noted. IMPRESSION: Bibasilar atelectatic changes. Electronically Signed   By: Inez Catalina M.D.   On: 02/01/2017 19:29   Ct Head Wo Contrast  Result Date: 02/01/2017 CLINICAL DATA:  Fall.  Head trauma EXAM: CT HEAD WITHOUT CONTRAST  TECHNIQUE: Contiguous axial images were obtained from the base  of the skull through the vertex without intravenous contrast. COMPARISON:  None. FINDINGS: Brain: Moderate to advanced atrophy. White matter low attenuation most consistent with chronic microvascular ischemia. Chronic infarct right parietal lobe. Negative for acute infarct, hemorrhage, or mass. Vascular: Negative for hyperdense vessel. Atherosclerotic calcification. Skull: Negative Sinuses/Orbits: Negative Other: None IMPRESSION: Atrophy and chronic ischemia.  No acute abnormality. Electronically Signed   By: Franchot Gallo M.D.   On: 02/01/2017 19:17     ASSESSMENT AND PLAN:   * Bibasilar pneumonia with sepsis and Acute hypoxic resp failure - POA IV abx. Change to levaquin. Not HCAP Nebs Wean o2 as tolerated  * AKI over CKD 3 Resolved with IVF. Now stopped  * HTN Home meds.  DVT prophylaxis heparin  All the records are reviewed and case discussed with Care Management/Social Workerr. Management plans discussed with the patient, family and they are in agreement.  CODE STATUS: FULL CODE  DVT Prophylaxis: SCDs  TOTAL TIME TAKING CARE OF THIS PATIENT: 35 minutes.   POSSIBLE D/C IN 1-2 DAYS, DEPENDING ON CLINICAL CONDITION.  Hillary Bow R M.D on 02/02/2017 at 12:02 PM  Between 7am to 6pm - Pager - [phone]  After 6pm go to [email or web address] - password [email or web address] [place] [facility]  Office  [phone]  CC: Primary care physician; Dion Body, MD  Note: This dictation was prepared with Dragon dictation along with smaller phrase technology. Any transcriptional errors that result from this process are unintentional.

## 2017-02-02 NOTE — Progress Notes (Signed)
Physical Therapy Evaluation Patient Details Name: Robert [name] MRN: [id number] DOB: [birth year] Today's Date: 02/02/2017   History of Present Illness  Pt admitted for sepsis, complaints of cough, weakness, loss of appetite, lost balance and had fallen. PMH of HTN, hyperlipidemia, CVA.  Chest imaging on 10/17 showed bibasilar atelectactic changes.    Clinical Impression  Pt is a pleasant 83 year old male admitted for sepsis. Pt performed supine there-ex, bed mobility with supervision, transfers and amb with RW with CGA. Pt amb to/from hallway, amb with short steps, able to maintain steady pace, appeared to be slightly SOB. Educated pt on use of RW for all mobility activities to provide extra support and prevent falls, pt agreed. Pt on room air throughout session, O2 sats >94%.  Pt demonstrates deficits with UE/LE strength and decreased endurance with mobility. Would benefit from further PT services to promote optimal return to home. Recommend transition to [facility] upon DC from acute hospitalization.    Follow Up Recommendations Home health PT    Equipment Recommendations  None recommended by PT (Pt has RW)    Recommendations for Other Services       Precautions / Restrictions Precautions Precautions: Fall Restrictions Weight Bearing Restrictions: No      Mobility  Bed Mobility Overal bed mobility: Needs Assistance Bed Mobility: Supine to Sit     Supine to sit: Supervision     General bed mobility comments: Pt able to rise from supine to seated EOB, utilized bed rail.   Transfers Overall transfer level: Needs assistance Equipment used: Rolling walker (2 wheeled) Transfers: Sit to/from Stand Sit to Stand: Min guard         General transfer comment: Pt able to rise from seated EOB, no cues needed.   Ambulation/Gait Ambulation/Gait assistance: Min guard Ambulation Distance (Feet): 90 Feet Assistive device: Rolling walker (2 wheeled) Gait Pattern/deviations: Step-through  pattern     General Gait Details: Pt amb with alternating gait, short steps, relies on RW for support, cues to keep RW on floor and push rather than lift. Pt able to maintain steady pace. CGA for safety.   Stairs            Wheelchair Mobility    Modified Rankin (Stroke Patients Only)       Balance Overall balance assessment: Needs assistance Sitting-balance support: Feet supported Sitting balance-Leahy Scale: Good Sitting balance - Comments: Pt able to sit at EOB and maintain position for several min, no assist.    Standing balance support: Bilateral upper extremity supported Standing balance-Leahy Scale: Good Standing balance comment: Pt able to stand with RW and maintain position for several minutes to adjust brief and gown. No LOB.                             Pertinent Vitals/Pain Pain Assessment: 0-10 Pain Score: 0-No pain    Home Living Family/patient expects to be discharged to:: Private residence Living Arrangements: Spouse/significant other Available Help at Discharge: Family Type of Home: House Home Access: Level entry     Home Layout: One level Home Equipment: Cane - single point;Walker - 2 wheels      Prior Function Level of Independence: Independent with assistive device(s)         Comments: Pt reports using a RW for amb within the home and cane for outside the home. Pt reports being independent with all ADLs and does not need assistance. Pt reports spending most  of his time inside his home.     Hand Dominance        Extremity/Trunk Assessment   Upper Extremity Assessment Upper Extremity Assessment: Generalized weakness (UE MMT grossly 4/5)    Lower Extremity Assessment Lower Extremity Assessment: Generalized weakness (LE MMT grossly 4/5)    Cervical / Trunk Assessment Cervical / Trunk Assessment: Normal  Communication   Communication: No difficulties;HOH  Cognition Arousal/Alertness: Awake/alert Behavior During  Therapy: WFL for tasks assessed/performed Overall Cognitive Status: Within Functional Limits for tasks assessed                                        General Comments      Exercises Other Exercises Other Exercises: Supine ther-ex, 10x, B ankle pumps, SLRs, hip abd/add. Cues for proper technique, no assist needed.    Assessment/Plan    PT Assessment Patient needs continued PT services  PT Problem List Decreased strength;Decreased range of motion;Decreased activity tolerance;Decreased balance;Decreased mobility;Decreased knowledge of use of DME       PT Treatment Interventions DME instruction;Gait training;Stair training;Functional mobility training;Therapeutic activities;Therapeutic exercise;Balance training;Patient/family education    PT Goals (Current goals can be found in the Care Plan section)  Acute Rehab PT Goals Patient Stated Goal: to get stronger and do some exercise at home  PT Goal Formulation: With patient Time For Goal Achievement: 02/16/17 Potential to Achieve Goals: Good    Frequency Min 2X/week   Barriers to discharge        Co-evaluation               AM-PAC PT "6 Clicks" Daily Activity  Outcome Measure Difficulty turning over in bed (including adjusting bedclothes, sheets and blankets)?: None Difficulty moving from lying on back to sitting on the side of the bed? : None Difficulty sitting down on and standing up from a chair with arms (e.g., wheelchair, bedside commode, etc,.)?: Unable Help needed moving to and from a bed to chair (including a wheelchair)?: A Little Help needed walking in hospital room?: A Little Help needed climbing 3-5 steps with a railing? : A Lot 6 Click Score: 17    End of Session Equipment Utilized During Treatment: Gait belt Activity Tolerance: Patient tolerated treatment well Patient left: in chair;with call bell/phone within reach;with chair alarm set Nurse Communication: Mobility status PT Visit  Diagnosis: Other abnormalities of gait and mobility (R26.89);Muscle weakness (generalized) (M62.81)    Time: 9509-3267 PT Time Calculation (min) (ACUTE ONLY): 23 min   Charges:         PT G Codes:   PT G-Codes **NOT FOR INPATIENT CLASS** Functional Assessment Tool Used: AM-PAC 6 Clicks Basic Mobility Functional Limitation: Mobility: Walking and moving around Mobility: Walking and Moving Around Current Status (T2458): At least 40 percent but less than 60 percent impaired, limited or restricted Mobility: Walking and Moving Around Goal Status [phone]): At least 20 percent but less than 40 percent impaired, limited or restricted    Manfred Arch, SPT  Manfred Arch 02/02/2017, 4:20 PM

## 2017-02-02 NOTE — Progress Notes (Signed)
Pharmacy Antibiotic Note  Robert [name] is a 83 y.o. male admitted on 02/01/2017 with CAP.  Pharmacy has been consulted for levofloxacin dosing.  Plan: Levofloxacin 750 mg IV Q48H x 7 days.  Height: 6' (182.9 cm) Weight: 221 lb (100.2 kg) IBW/kg (Calculated) : 77.6  Temp (24hrs), Avg:98.8 F (37.1 C), Min:98.3 F (36.8 C), Max:99.4 F (37.4 C)   Recent Labs Lab 02/01/17 1852 02/01/17 1853 02/02/17 0408  WBC 22.4*  --  15.1*  CREATININE 2.10*  --  1.49*  LATICACIDVEN  --  2.2* 0.9    Estimated Creatinine Clearance: 37.9 mL/min (A) (by C-G formula based on SCr of 1.49 mg/dL (H)).    Allergies  Allergen Reactions  . Morphine And Related   . Tramadol Nausea Only    Other reaction(s): Vomiting   Thank you for allowing pharmacy to be a part of this patient's care.  Laural Benes, Pharm.D., BCPS Clinical Pharmacist 02/02/2017 12:07 PM

## 2017-02-03 ENCOUNTER — Inpatient Hospital Stay: Payer: Medicare Other

## 2017-02-03 LAB — CBC WITH DIFFERENTIAL/PLATELET
Basophils Absolute: 0 10*3/uL (ref 0–0.1)
Basophils Relative: 0 %
EOS PCT: 4 %
Eosinophils Absolute: 0.4 10*3/uL (ref 0–0.7)
HEMATOCRIT: 37.7 % — AB (ref 40.0–52.0)
HEMOGLOBIN: 13 g/dL (ref 13.0–18.0)
Lymphocytes Relative: 9 %
Lymphs Abs: 0.8 10*3/uL — ABNORMAL LOW (ref 1.0–3.6)
MCH: 32 pg (ref 26.0–34.0)
MCHC: 34.6 g/dL (ref 32.0–36.0)
MCV: 92.4 fL (ref 80.0–100.0)
MONOS PCT: 11 %
Monocytes Absolute: 1 10*3/uL (ref 0.2–1.0)
NEUTROS PCT: 76 %
Neutro Abs: 7 10*3/uL — ABNORMAL HIGH (ref 1.4–6.5)
Platelets: 132 10*3/uL — ABNORMAL LOW (ref 150–440)
RBC: 4.08 MIL/uL — AB (ref 4.40–5.90)
RDW: 16.5 % — ABNORMAL HIGH (ref 11.5–14.5)
WBC: 9.2 10*3/uL (ref 3.8–10.6)

## 2017-02-03 LAB — BASIC METABOLIC PANEL
ANION GAP: 5 (ref 5–15)
BUN: 25 mg/dL — ABNORMAL HIGH (ref 6–20)
CHLORIDE: 114 mmol/L — AB (ref 101–111)
CO2: 22 mmol/L (ref 22–32)
Calcium: 8.8 mg/dL — ABNORMAL LOW (ref 8.9–10.3)
Creatinine, Ser: 1.33 mg/dL — ABNORMAL HIGH (ref 0.61–1.24)
GFR calc non Af Amer: 44 mL/min — ABNORMAL LOW (ref >60)
GFR, EST AFRICAN AMERICAN: 51 mL/min — AB (ref >60)
Glucose, Bld: 90 mg/dL (ref 65–99)
Potassium: 3.6 mmol/L (ref 3.5–5.1)
Sodium: 141 mmol/L (ref 135–145)

## 2017-02-03 LAB — URINE CULTURE

## 2017-02-03 MED ORDER — GUAIFENESIN-DM 100-10 MG/5ML PO SYRP: 5.0000 mL | ORAL_SOLUTION | ORAL | 0 refills | Status: DC | PRN

## 2017-02-03 MED ORDER — LEVOFLOXACIN 500 MG PO TABS: 500.0000 mg | ORAL_TABLET | Freq: Every day | ORAL | 0 refills | Status: AC

## 2017-02-03 MED ORDER — DOCUSATE SODIUM 100 MG PO CAPS
200.0000 mg | ORAL_CAPSULE | Freq: Two times a day (BID) | ORAL | Status: DC
  Administered 2017-02-03: 10:00:00 200 mg via ORAL
  Filled 2017-02-03: qty 2

## 2017-02-03 MED ORDER — POLYETHYLENE GLYCOL 3350 17 G PO PACK
17.0000 g | PACK | Freq: Every day | ORAL | Status: DC
  Administered 2017-02-03: 10:00:00 17 g via ORAL
  Filled 2017-02-03: qty 1

## 2017-02-03 NOTE — Discharge Instructions (Addendum)
Resume diet and activity as before   Outpatient PT and OT for weakness

## 2017-02-03 NOTE — NC FL2 (Signed)
[place] LEVEL OF CARE SCREENING TOOL     IDENTIFICATION  Patient Name: Robert [name] Birthdate: 1920-10-25 Sex: male Admission Date (Current Location): 02/01/2017  [place] and [place] Number:  Engineering geologist and Address:  [facility], [address] [place], [place] [address]      Provider Number: [id number]  Attending Physician Name and Address:  Hillary Bow, MD  Relative Name and Phone Number:       Current Level of Care: Hospital Recommended Level of Care: [place] Prior Approval Number:    Date Approved/Denied:   PASRR Number: [id number]  Discharge Plan: SNF    Current Diagnoses: Patient Active Problem List   Diagnosis Date Noted  . Sepsis ([place]) 02/01/2017  . HCAP (healthcare-associated pneumonia) 02/01/2017  . AKI (acute kidney injury) ([place]) 02/01/2017  . Essential hypertension with goal blood pressure less than 140/90 10/03/2016  . History of CVA (cerebrovascular accident) 10/03/2016  . Pure hypercholesterolemia 10/03/2016  . Other male erectile dysfunction 06/27/2016  . Aortic atherosclerosis ([place]) 03/01/2016  . Vaccine counseling 12/30/2015    Orientation RESPIRATION BLADDER Height & Weight     Self, Time, Situation, Place  O2 (2L) Continent Weight: 221 lb (100.2 kg) Height:  6' (182.9 cm)  BEHAVIORAL SYMPTOMS/MOOD NEUROLOGICAL BOWEL NUTRITION STATUS      Continent Diet (Cardiac diet)  AMBULATORY STATUS COMMUNICATION OF NEEDS Skin   Limited Assist Verbally Normal                       Personal Care Assistance Level of Assistance  Bathing, Feeding, Dressing Bathing Assistance: Limited assistance Feeding assistance: Independent Dressing Assistance: Limited assistance     Functional Limitations Info  Sight, Hearing, Speech Sight Info: Adequate Hearing Info: Adequate Speech Info: Adequate    SPECIAL CARE FACTORS FREQUENCY  PT (By licensed PT)     PT Frequency: 5x a  week              Contractures      Additional Factors Info  Code Status, Allergies Code Status Info: Full Code Allergies Info: MORPHINE AND RELATED, TRAMADOL            Current Medications (02/03/2017):  This is the current hospital active medication list Current Facility-Administered Medications  Medication Dose Route Frequency Provider Last Rate Last Dose  . acetaminophen (TYLENOL) tablet 650 mg  650 mg Oral Q6H PRN Lance Coon, MD       Or  . acetaminophen (TYLENOL) suppository 650 mg  650 mg Rectal Q6H PRN Lance Coon, MD      . amLODipine (NORVASC) tablet 5 mg  5 mg Oral Daily Lance Coon, MD   5 mg at 02/03/17 0959  . aspirin EC tablet 81 mg  81 mg Oral Daily Lance Coon, MD   81 mg at 02/03/17 1062  . atorvastatin (LIPITOR) tablet 10 mg  10 mg Oral Daily Lance Coon, MD   10 mg at 02/03/17 0955  . docusate sodium (COLACE) capsule 200 mg  200 mg Oral BID Hillary Bow, MD   200 mg at 02/03/17 0958  . enoxaparin (LOVENOX) injection 40 mg  40 mg Subcutaneous Q24H Hillary Bow, MD   40 mg at 02/02/17 2126  . guaiFENesin-dextromethorphan (ROBITUSSIN DM) 100-10 MG/5ML syrup 5 mL  5 mL Oral Q4H PRN Lance Coon, MD      . hydrALAZINE (APRESOLINE) tablet 25 mg  25 mg Oral TID Lance Coon, MD  25 mg at 02/03/17 0959  . ipratropium-albuterol (DUONEB) 0.5-2.5 (3) MG/3ML nebulizer solution 3 mL  3 mL Nebulization Q4H PRN Lance Coon, MD      . levofloxacin (LEVAQUIN) IVPB 750 mg  750 mg Intravenous Q48H Hillary Bow, MD   Stopped at 02/03/17 0001  . MEDLINE mouth rinse  15 mL Mouth Rinse BID Lance Coon, MD   15 mL at 02/03/17 1004  . Melatonin TABS 10 mg  10 mg Oral QHS PRN Lance Coon, MD      . ondansetron [facility]) tablet 4 mg  4 mg Oral Q6H PRN Lance Coon, MD       Or  . ondansetron [facility]) injection 4 mg  4 mg Intravenous Q6H PRN Lance Coon, MD      . polyethylene glycol (MIRALAX / GLYCOLAX) packet 17 g  17 g Oral Daily Hillary Bow, MD   17 g  at 02/03/17 1002     Discharge Medications: Please see discharge summary for a list of discharge medications.  Relevant Imaging Results:  Relevant Lab Results:   Additional Information SSN [id number]  Ross Ludwig, [place]

## 2017-02-03 NOTE — Progress Notes (Signed)
Discharge instructions given and went over with patient and patients wife at bedside. Prescriptions reviewed. All questions answered. Patient discharged home with wife via wheelchair by nursing staff. Madlyn Frankel, RN

## 2017-02-03 NOTE — Care Management Important Message (Signed)
Important Message  Patient Details  Name: Robert [name] MRN: [id number] Date of Birth: 1920/05/12   Medicare Important Message Given:  Yes    Shelbie Ammons, RN 02/03/2017, 8:26 AM

## 2017-02-03 NOTE — Clinical Social Work Note (Signed)
CSW received referral for SNF.  Case discussed with case manager and plan is to discharge home with OT and PT outpatient orders so patient can go to the health care services at [facility].  CSW to sign off please re-consult if social work needs arise.  Robert [name]. [place], MSW, [place]

## 2017-02-03 NOTE — Progress Notes (Signed)
SATURATION QUALIFICATIONS: (This note is used to comply with regulatory documentation for home oxygen)  Patient Saturations on Room Air at Rest = 94%  Patient Saturations on Room Air while Ambulating = 93%  Madlyn Frankel, RN

## 2017-02-04 LAB — BLOOD CULTURE ID PANEL (REFLEXED)
ACINETOBACTER BAUMANNII: NOT DETECTED
CANDIDA ALBICANS: NOT DETECTED
CANDIDA KRUSEI: NOT DETECTED
Candida glabrata: NOT DETECTED
Candida parapsilosis: NOT DETECTED
Candida tropicalis: NOT DETECTED
ENTEROBACTER CLOACAE COMPLEX: NOT DETECTED
ENTEROBACTERIACEAE SPECIES: NOT DETECTED
ENTEROCOCCUS SPECIES: DETECTED — AB
ESCHERICHIA COLI: NOT DETECTED
Haemophilus influenzae: NOT DETECTED
Klebsiella oxytoca: NOT DETECTED
Klebsiella pneumoniae: NOT DETECTED
Listeria monocytogenes: NOT DETECTED
Neisseria meningitidis: NOT DETECTED
PSEUDOMONAS AERUGINOSA: NOT DETECTED
Proteus species: NOT DETECTED
SERRATIA MARCESCENS: NOT DETECTED
STAPHYLOCOCCUS SPECIES: NOT DETECTED
STREPTOCOCCUS AGALACTIAE: NOT DETECTED
STREPTOCOCCUS PNEUMONIAE: NOT DETECTED
STREPTOCOCCUS PYOGENES: NOT DETECTED
Staphylococcus aureus (BCID): NOT DETECTED
Streptococcus species: NOT DETECTED
VANCOMYCIN RESISTANCE: NOT DETECTED

## 2017-02-04 NOTE — Progress Notes (Signed)
PHARMACY - PHYSICIAN COMMUNICATION CRITICAL VALUE ALERT - BLOOD CULTURE IDENTIFICATION (BCID)  Results for orders placed or performed during the hospital encounter of 02/01/17  Blood Culture ID Panel (Reflexed) (Collected: 02/01/2017  6:51 PM)  Result Value Ref Range   Enterococcus species DETECTED (A) NOT DETECTED   Vancomycin resistance NOT DETECTED NOT DETECTED   Listeria monocytogenes NOT DETECTED NOT DETECTED   Staphylococcus species NOT DETECTED NOT DETECTED   Staphylococcus aureus NOT DETECTED NOT DETECTED   Streptococcus species NOT DETECTED NOT DETECTED   Streptococcus agalactiae NOT DETECTED NOT DETECTED   Streptococcus pneumoniae NOT DETECTED NOT DETECTED   Streptococcus pyogenes NOT DETECTED NOT DETECTED   Acinetobacter baumannii NOT DETECTED NOT DETECTED   Enterobacteriaceae species NOT DETECTED NOT DETECTED   Enterobacter cloacae complex NOT DETECTED NOT DETECTED   Escherichia coli NOT DETECTED NOT DETECTED   Klebsiella oxytoca NOT DETECTED NOT DETECTED   Klebsiella pneumoniae NOT DETECTED NOT DETECTED   Proteus species NOT DETECTED NOT DETECTED   Serratia marcescens NOT DETECTED NOT DETECTED   Haemophilus influenzae NOT DETECTED NOT DETECTED   Neisseria meningitidis NOT DETECTED NOT DETECTED   Pseudomonas aeruginosa NOT DETECTED NOT DETECTED   Candida albicans NOT DETECTED NOT DETECTED   Candida glabrata NOT DETECTED NOT DETECTED   Candida krusei NOT DETECTED NOT DETECTED   Candida parapsilosis NOT DETECTED NOT DETECTED   Candida tropicalis NOT DETECTED NOT DETECTED    Name of physician (or Provider) Contacted: Dr. Jannifer Franklin  Changes to prescribed antibiotics required: Patient was discharged home on 10/19 with levofloxacin. Dr. Jannifer Franklin will pass along information to discharging doctor. Pharmacy will follow up on 10/21.    Pernell Dupre, PharmD, BCPS Clinical Pharmacist 02/04/2017 9:29 PM

## 2017-02-05 NOTE — Progress Notes (Signed)
Patient is actually discharged home at [facility] and not the skilled part of [facility].  Called patient and spoke with him and his wife- called in amoxicillin 1000mg  PO BID x 10 days to [facility] aid pharmacy. Will have to call [facility] in AM- so patient can go and have blood cultures drawn.

## 2017-02-05 NOTE — Progress Notes (Signed)
Spoke with Dr. Tressia Miners in regards to positive bcx for enterococcus species. Dr. Ola Spurr recommended adding amoxicillin x 10 days and redrawing blood cultures on Monday. Recommended amoxicllin 1g q 8-12hr x 10 days Dr. Tressia Miners stated she would call in and would figure out where he can have Bcx redrawn.  Ramond Dial, Pharm.D, BCPS Clinical Pharmacist

## 2017-02-06 LAB — CULTURE, BLOOD (ROUTINE X 2)
Culture: NO GROWTH
SPECIAL REQUESTS: ADEQUATE

## 2017-02-07 LAB — CULTURE, BLOOD (ROUTINE X 2): Special Requests: ADEQUATE

## 2017-02-13 NOTE — Discharge Summary (Signed)
[place] [facility] [place] NAME: Robert [name]    MR#:  [id number]  DATE OF BIRTH:  04/21/1920  DATE OF ADMISSION:  02/01/2017 ADMITTING PHYSICIAN: Lance Coon, MD  DATE OF DISCHARGE: 02/03/2017  4:45 PM  PRIMARY CARE PHYSICIAN: Dion Body, MD   ADMISSION DIAGNOSIS:  AKI (acute kidney injury) ([place]) [N17.9] Sepsis, due to unspecified organism ([place]) [A41.9] Community acquired pneumonia of left lower lobe of lung ([place]) [J18.1]  DISCHARGE DIAGNOSIS:  Principal Problem:   Sepsis ([place]) Active Problems:   Essential hypertension with goal blood pressure less than 140/90   Pure hypercholesterolemia   HCAP (healthcare-associated pneumonia)   AKI (acute kidney injury) ([place])   SECONDARY DIAGNOSIS:   Past Medical History:  Diagnosis Date  . Aortic atherosclerosis ([place]) 03/01/2016  . Arthritis   . High cholesterol   . Hypertension   . TIA (transient ischemic attack)      ADMITTING HISTORY  Robert [name]  is a 83 y.o. male who presents with productive cough, weakness, loss of appetite over the last couple days. Here in the ED he was found to meet sepsis criteria, has pneumonia on x-ray. He was treated with sepsis protocol or HCAP and hospitalists were called for admission  HOSPITAL COURSE:   * Bibasilar pneumonia with sepsis and Acute hypoxic resp failure - POA Not HCAP IV levaquin. Changed to PO at discharge Afebrile, not needing O2.  * AKI over CKD 3 Resolved with IVF. stopped  * HTN Home meds continued  Stable for discharge back home to follow up with PCP in 1 week  CONSULTS OBTAINED:    DRUG ALLERGIES:   Allergies  Allergen Reactions  . Morphine And Related   . Tramadol Nausea Only    Other reaction(s): Vomiting    DISCHARGE MEDICATIONS:   Discharge Medication List as of 02/03/2017  4:11 PM    START taking these medications   Details  guaiFENesin-dextromethorphan (ROBITUSSIN DM) 100-10 MG/5ML syrup Take 5 mLs  by mouth every 4 (four) hours as needed for cough., Starting Fri 02/03/2017, Normal    levofloxacin (LEVAQUIN) 500 MG tablet Take 1 tablet (500 mg total) by mouth daily., Starting Fri 02/03/2017, Until Mon 02/13/2017, Normal      CONTINUE these medications which have NOT CHANGED   Details  amLODipine (NORVASC) 5 MG tablet Take 5 mg by mouth daily., Historical Med    aspirin EC 325 MG tablet Take 81 mg by mouth daily., Historical Med    atorvastatin (LIPITOR) 10 MG tablet Take 10 mg by mouth daily., Historical Med    hydrALAZINE (APRESOLINE) 25 MG tablet Take 25 mg by mouth 3 (three) times daily., Historical Med    Melatonin 10 MG TABS Take 10 mg by mouth at bedtime as needed (Sleep). , Historical Med    Omega-3 Fatty Acids (FISH OIL PO) Take 1 capsule by mouth daily., Historical Med        Today   VITAL SIGNS:  Blood pressure (!) 141/61, pulse 83, temperature 98.6 F (37 C), temperature source Oral, resp. rate 18, height 6' (1.829 m), weight 100.2 kg (221 lb), SpO2 93 %.  I/O:  No intake or output data in the 24 hours ending 02/13/17 1316  PHYSICAL EXAMINATION:  Physical Exam  GENERAL:  83 y.o.-year-old patient lying in the bed with no acute distress.  LUNGS: Normal breath sounds bilaterally, no wheezing, rales,rhonchi or crepitation. No use of accessory muscles of respiration.  CARDIOVASCULAR: S1, S2 normal. No  murmurs, rubs, or gallops.  ABDOMEN: Soft, non-tender, non-distended. Bowel sounds present. No organomegaly or mass.  NEUROLOGIC: Moves all 4 extremities. PSYCHIATRIC: The patient is alert and oriented x 3.  SKIN: No obvious rash, lesion, or ulcer.   DATA REVIEW:   CBC No results for input(s): WBC, HGB, HCT, PLT in the last 168 hours.  Chemistries  No results for input(s): NA, K, CL, CO2, GLUCOSE, BUN, CREATININE, CALCIUM, MG, AST, ALT, ALKPHOS, BILITOT in the last 168 hours.  Invalid input(s): GFRCGP  Cardiac Enzymes No results for input(s): TROPONINI in  the last 168 hours.  Microbiology Results  Results for orders placed or performed during the hospital encounter of 02/01/17  Urine Culture     Status: Abnormal   Collection Time: 02/01/17  6:51 PM  Result Value Ref Range Status   Specimen Description URINE, RANDOM  Final   Special Requests NONE  Final   Culture MULTIPLE SPECIES PRESENT, SUGGEST RECOLLECTION (A)  Final   Report Status 02/03/2017 FINAL  Final  Blood culture (routine x 2)     Status: None   Collection Time: 02/01/17  6:51 PM  Result Value Ref Range Status   Specimen Description BLOOD LT HAND  Final   Special Requests   Final    BOTTLES DRAWN AEROBIC AND ANAEROBIC Blood Culture adequate volume   Culture NO GROWTH 5 DAYS  Final   Report Status 02/06/2017 FINAL  Final  Blood culture (routine x 2)     Status: Abnormal   Collection Time: 02/01/17  6:51 PM  Result Value Ref Range Status   Specimen Description BLOOD LT WRIST  Final   Special Requests   Final    BOTTLES DRAWN AEROBIC AND ANAEROBIC Blood Culture adequate volume   Culture  Setup Time   Final    IN BOTH AEROBIC AND ANAEROBIC BOTTLES GRAM POSITIVE COCCI CRITICAL RESULT CALLED TO, READ BACK BY AND VERIFIED WITH: SHEENA HALLAJI AT 2035 ON 02/04/17 RWW    Culture ENTEROCOCCUS FAECALIS (A)  Final   Report Status 02/07/2017 FINAL  Final   Organism ID, Bacteria ENTEROCOCCUS FAECALIS  Final      Susceptibility   Enterococcus faecalis - MIC*    AMPICILLIN <=2 SENSITIVE Sensitive     VANCOMYCIN 2 SENSITIVE Sensitive     GENTAMICIN SYNERGY SENSITIVE Sensitive     * ENTEROCOCCUS FAECALIS  Blood Culture ID Panel (Reflexed)     Status: Abnormal   Collection Time: 02/01/17  6:51 PM  Result Value Ref Range Status   Enterococcus species DETECTED (A) NOT DETECTED Final    Comment: CRITICAL RESULT CALLED TO, READ BACK BY AND VERIFIED WITH: SHEENA HALLAJI AT 2035 ON 02/04/17 RWW    Vancomycin resistance NOT DETECTED NOT DETECTED Final   Listeria monocytogenes NOT  DETECTED NOT DETECTED Final   Staphylococcus species NOT DETECTED NOT DETECTED Final   Staphylococcus aureus NOT DETECTED NOT DETECTED Final   Streptococcus species NOT DETECTED NOT DETECTED Final   Streptococcus agalactiae NOT DETECTED NOT DETECTED Final   Streptococcus pneumoniae NOT DETECTED NOT DETECTED Final   Streptococcus pyogenes NOT DETECTED NOT DETECTED Final   Acinetobacter baumannii NOT DETECTED NOT DETECTED Final   Enterobacteriaceae species NOT DETECTED NOT DETECTED Final   Enterobacter cloacae complex NOT DETECTED NOT DETECTED Final   Escherichia coli NOT DETECTED NOT DETECTED Final   Klebsiella oxytoca NOT DETECTED NOT DETECTED Final   Klebsiella pneumoniae NOT DETECTED NOT DETECTED Final   Proteus species NOT DETECTED NOT DETECTED Final  Serratia marcescens NOT DETECTED NOT DETECTED Final   Haemophilus influenzae NOT DETECTED NOT DETECTED Final   Neisseria meningitidis NOT DETECTED NOT DETECTED Final   Pseudomonas aeruginosa NOT DETECTED NOT DETECTED Final   Candida albicans NOT DETECTED NOT DETECTED Final   Candida glabrata NOT DETECTED NOT DETECTED Final   Candida krusei NOT DETECTED NOT DETECTED Final   Candida parapsilosis NOT DETECTED NOT DETECTED Final   Candida tropicalis NOT DETECTED NOT DETECTED Final    RADIOLOGY:  No results found.  Follow up with PCP in 1 week.  Management plans discussed with the patient, family and they are in agreement.  CODE STATUS:  Code Status History    Date Active Date Inactive Code Status Order ID Comments User Context   02/01/2017 11:17 PM 02/03/2017  9:17 PM Full Code [id number]  Lance Coon, MD ED    Advance Directive Documentation     Most Recent Value  Type of Advance Directive  Healthcare Power of Attorney  Pre-existing out of facility DNR order (yellow form or pink MOST form)  -  "MOST" Form in Place?  -      TOTAL TIME TAKING CARE OF THIS PATIENT ON DAY OF DISCHARGE: more than 30 minutes.   Hillary Bow  R M.D on 02/13/2017 at 1:16 PM  Between 7am to 6pm - Pager - [phone]  After 6pm go to [email or web address] - password [email or web address] [place] [facility]  Office  [phone]  CC: Primary care physician; Dion Body, MD  Note: This dictation was prepared with Dragon dictation along with smaller phrase technology. Any transcriptional errors that result from this process are unintentional.

## 2017-06-21 ENCOUNTER — Other Ambulatory Visit: Payer: Self-pay | Admitting: Family Medicine

## 2017-06-21 ENCOUNTER — Ambulatory Visit
Admission: RE | Admit: 2017-06-21 | Discharge: 2017-06-21 | Disposition: A | Discharge status: Home / Self Care | Payer: Medicare Other | Source: Ambulatory Visit | Attending: Family Medicine | Admitting: Family Medicine

## 2017-06-21 DIAGNOSIS — Z8673 Personal history of transient ischemic attack (TIA), and cerebral infarction without residual deficits: Secondary | ICD-10-CM | POA: Diagnosis present

## 2017-06-21 DIAGNOSIS — R4701 Aphasia: Secondary | ICD-10-CM

## 2017-06-21 DIAGNOSIS — G459 Transient cerebral ischemic attack, unspecified: Secondary | ICD-10-CM

## 2017-06-21 DIAGNOSIS — I6782 Cerebral ischemia: Secondary | ICD-10-CM | POA: Insufficient documentation

## 2017-06-21 DIAGNOSIS — G319 Degenerative disease of nervous system, unspecified: Secondary | ICD-10-CM | POA: Insufficient documentation

## 2017-06-26 ENCOUNTER — Ambulatory Visit
Admission: RE | Admit: 2017-06-26 | Discharge: 2017-06-26 | Disposition: A | Discharge status: Home / Self Care | Payer: Medicare Other | Source: Ambulatory Visit | Attending: Family Medicine | Admitting: Family Medicine

## 2017-06-26 DIAGNOSIS — R4701 Aphasia: Secondary | ICD-10-CM | POA: Insufficient documentation

## 2017-06-26 DIAGNOSIS — I340 Nonrheumatic mitral (valve) insufficiency: Secondary | ICD-10-CM | POA: Diagnosis not present

## 2017-06-26 DIAGNOSIS — Z8673 Personal history of transient ischemic attack (TIA), and cerebral infarction without residual deficits: Secondary | ICD-10-CM | POA: Insufficient documentation

## 2017-06-26 DIAGNOSIS — G459 Transient cerebral ischemic attack, unspecified: Secondary | ICD-10-CM | POA: Insufficient documentation

## 2017-06-26 DIAGNOSIS — I6523 Occlusion and stenosis of bilateral carotid arteries: Secondary | ICD-10-CM | POA: Diagnosis not present

## 2017-06-26 NOTE — Progress Notes (Signed)
*  PRELIMINARY RESULTS* Echocardiogram 2D Echocardiogram has been performed.  Robert [name] Robert [name] 06/26/2017, 11:52 AM

## 2018-02-19 ENCOUNTER — Inpatient Hospital Stay
Admission: EM | Admit: 2018-02-19 | Discharge: 2018-02-22 | DRG: 660 | Disposition: A | Discharge status: Skilled Nursing Facility | Payer: Medicare Other | Attending: Internal Medicine | Admitting: Internal Medicine

## 2018-02-19 ENCOUNTER — Emergency Department: Payer: Medicare Other

## 2018-02-19 ENCOUNTER — Other Ambulatory Visit: Payer: Self-pay

## 2018-02-19 DIAGNOSIS — N182 Chronic kidney disease, stage 2 (mild): Secondary | ICD-10-CM | POA: Diagnosis present

## 2018-02-19 DIAGNOSIS — N200 Calculus of kidney: Secondary | ICD-10-CM

## 2018-02-19 DIAGNOSIS — N390 Urinary tract infection, site not specified: Secondary | ICD-10-CM

## 2018-02-19 DIAGNOSIS — Z79899 Other long term (current) drug therapy: Secondary | ICD-10-CM

## 2018-02-19 DIAGNOSIS — Z96643 Presence of artificial hip joint, bilateral: Secondary | ICD-10-CM | POA: Diagnosis present

## 2018-02-19 DIAGNOSIS — I5032 Chronic diastolic (congestive) heart failure: Secondary | ICD-10-CM | POA: Diagnosis present

## 2018-02-19 DIAGNOSIS — N136 Pyonephrosis: Secondary | ICD-10-CM | POA: Diagnosis present

## 2018-02-19 DIAGNOSIS — Z8673 Personal history of transient ischemic attack (TIA), and cerebral infarction without residual deficits: Secondary | ICD-10-CM

## 2018-02-19 DIAGNOSIS — N132 Hydronephrosis with renal and ureteral calculous obstruction: Secondary | ICD-10-CM

## 2018-02-19 DIAGNOSIS — N189 Chronic kidney disease, unspecified: Secondary | ICD-10-CM

## 2018-02-19 DIAGNOSIS — N184 Chronic kidney disease, stage 4 (severe): Secondary | ICD-10-CM | POA: Diagnosis not present

## 2018-02-19 DIAGNOSIS — Z7982 Long term (current) use of aspirin: Secondary | ICD-10-CM

## 2018-02-19 DIAGNOSIS — N179 Acute kidney failure, unspecified: Secondary | ICD-10-CM | POA: Diagnosis present

## 2018-02-19 DIAGNOSIS — E7800 Pure hypercholesterolemia, unspecified: Secondary | ICD-10-CM | POA: Diagnosis present

## 2018-02-19 DIAGNOSIS — I130 Hypertensive heart and chronic kidney disease with heart failure and stage 1 through stage 4 chronic kidney disease, or unspecified chronic kidney disease: Secondary | ICD-10-CM | POA: Diagnosis present

## 2018-02-19 DIAGNOSIS — N201 Calculus of ureter: Secondary | ICD-10-CM | POA: Diagnosis not present

## 2018-02-19 DIAGNOSIS — N10 Acute tubulo-interstitial nephritis: Secondary | ICD-10-CM | POA: Diagnosis not present

## 2018-02-19 DIAGNOSIS — I10 Essential (primary) hypertension: Secondary | ICD-10-CM | POA: Diagnosis present

## 2018-02-19 DIAGNOSIS — R531 Weakness: Secondary | ICD-10-CM | POA: Diagnosis present

## 2018-02-19 DIAGNOSIS — Z87891 Personal history of nicotine dependence: Secondary | ICD-10-CM

## 2018-02-19 LAB — URINALYSIS, COMPLETE (UACMP) WITH MICROSCOPIC
BILIRUBIN URINE: NEGATIVE
GLUCOSE, UA: NEGATIVE mg/dL
KETONES UR: NEGATIVE mg/dL
Nitrite: POSITIVE — AB
PH: 5 (ref 5.0–8.0)
Protein, ur: NEGATIVE mg/dL
SQUAMOUS EPITHELIAL / LPF: NONE SEEN (ref 0–5)
Specific Gravity, Urine: 1.017 (ref 1.005–1.030)

## 2018-02-19 LAB — COMPREHENSIVE METABOLIC PANEL
ALK PHOS: 70 U/L (ref 38–126)
ALT: 15 U/L (ref 0–44)
AST: 22 U/L (ref 15–41)
Albumin: 3.9 g/dL (ref 3.5–5.0)
Anion gap: 9 (ref 5–15)
BILIRUBIN TOTAL: 1.3 mg/dL — AB (ref 0.3–1.2)
BUN: 30 mg/dL — AB (ref 8–23)
CHLORIDE: 112 mmol/L — AB (ref 98–111)
CO2: 20 mmol/L — ABNORMAL LOW (ref 22–32)
Calcium: 9.1 mg/dL (ref 8.9–10.3)
Creatinine, Ser: 1.82 mg/dL — ABNORMAL HIGH (ref 0.61–1.24)
GFR calc non Af Amer: 30 mL/min — ABNORMAL LOW (ref >60)
GFR, EST AFRICAN AMERICAN: 35 mL/min — AB (ref >60)
Glucose, Bld: 121 mg/dL — ABNORMAL HIGH (ref 70–99)
Potassium: 4.1 mmol/L (ref 3.5–5.1)
Sodium: 141 mmol/L (ref 135–145)
Total Protein: 6.8 g/dL (ref 6.5–8.1)

## 2018-02-19 LAB — CBC
HEMATOCRIT: 43.9 % (ref 39.0–52.0)
HEMOGLOBIN: 14.8 g/dL (ref 13.0–17.0)
MCH: 32.2 pg (ref 26.0–34.0)
MCHC: 33.7 g/dL (ref 30.0–36.0)
MCV: 95.6 fL (ref 80.0–100.0)
NRBC: 0 % (ref 0.0–0.2)
PLATELETS: 168 10*3/uL (ref 150–400)
RBC: 4.59 MIL/uL (ref 4.22–5.81)
RDW: 14 % (ref 11.5–15.5)
WBC: 16.5 10*3/uL — ABNORMAL HIGH (ref 4.0–10.5)

## 2018-02-19 LAB — TROPONIN I

## 2018-02-19 MED ORDER — TAMSULOSIN HCL 0.4 MG PO CAPS
0.4000 mg | ORAL_CAPSULE | Freq: Every day | ORAL | Status: DC
  Administered 2018-02-19: 0.4 mg via ORAL
  Filled 2018-02-19: qty 1

## 2018-02-19 MED ORDER — SODIUM CHLORIDE 0.9 % IV BOLUS
500.0000 mL | Freq: Once | INTRAVENOUS | Status: AC
  Administered 2018-02-19: 500 mL via INTRAVENOUS

## 2018-02-19 MED ORDER — CEFTRIAXONE SODIUM 1 G IJ SOLR
1.0000 g | Freq: Once | INTRAMUSCULAR | Status: AC
  Administered 2018-02-19: 1 g via INTRAVENOUS
  Filled 2018-02-19: qty 10

## 2018-02-19 NOTE — Consult Note (Signed)
H&P  Physician requesting consult: Lavonia Drafts, MD  Chief Complaint: Left ureteral calculus  History of Present Illness: 84 year old male presented with a primary complaint of weakness for 2 days.  He is accompanied by his wife.  He also had some left-sided flank pain earlier in the day that has now resolved.  This prompted a CT scan of the abdomen and pelvis which revealed a 5 mm distal left ureteral calculus close to the ureterovesicular junction.  There was some mild upstream hydronephrosis with some perinephric stranding.  Labs revealed mild acute renal insufficiency with a creatinine of 1.82 from a baseline of 1.3.  He had mild leukocytosis with a white blood cell count 16.5.  Urinalysis revealed trace leukocyte, positive nitrite, many bacteria, 0-5 RBCs, 6-10 WBCs.  The patient denies any voiding complaints except for a little bit increased frequency.  He denies hematuria or dysuria.  He denies any current flank pain.  He is afebrile, vital signs are stable.  He denies any fever at home.  He states that his weakness is improving.  He and his wife declined any change in baseline mental function or any change in his baseline status except for some weakness.  He denies any nausea or vomiting.  Past Medical History:  Diagnosis Date  . Aortic atherosclerosis ([place]) 03/01/2016  . Arthritis   . High cholesterol   . Hypertension   . TIA (transient ischemic attack)    Past Surgical History:  Procedure Laterality Date  . LEG SURGERY    . TOTAL HIP ARTHROPLASTY Bilateral     Home Medications:   (Not in a hospital admission) Allergies:  Allergies  Allergen Reactions  . Morphine And Related   . Tramadol Nausea Only    Other reaction(s): Vomiting    Family History  Problem Relation Age of Onset  . Prostate cancer Neg Hx   . Bladder Cancer Neg Hx   . Kidney cancer Neg Hx    Social History:  reports that he has quit smoking. He has never used smokeless tobacco. He reports that he drinks  about 7.0 standard drinks of alcohol per week. He reports that he does not use drugs.  ROS: A complete review of systems was performed.  All systems are negative except for pertinent findings as noted. ROS   Physical Exam:  Vital signs in last 24 hours: Temp:  [97.6 F (36.4 C)] 97.6 F (36.4 C) (11/04 1915) Pulse Rate:  [40-85] 64 (11/04 2200) Resp:  [15-23] 21 (11/04 2200) BP: (130-171)/(72-93) 171/93 (11/04 2200) SpO2:  [94 %-99 %] 94 % (11/04 2200) Weight:  [95.3 kg] 95.3 kg (11/04 1916) General:  Alert and oriented, No acute distress HEENT: Normocephalic, atraumatic Neck: No JVD or lymphadenopathy Cardiovascular: Regular rate and rhythm Lungs: Regular rate and effort Abdomen: Soft, nontender, nondistended, no abdominal masses Back: No CVA tenderness Extremities: No edema Neurologic: Grossly intact  Laboratory Data:  Results for orders placed or performed during the hospital encounter of 02/19/18 (from the past 24 hour(s))  CBC     Status: Abnormal   Collection Time: 02/19/18  7:28 PM  Result Value Ref Range   WBC 16.5 (H) 4.0 - 10.5 K/uL   RBC 4.59 4.22 - 5.81 MIL/uL   Hemoglobin 14.8 13.0 - 17.0 g/dL   HCT 43.9 39.0 - 52.0 %   MCV 95.6 80.0 - 100.0 fL   MCH 32.2 26.0 - 34.0 pg   MCHC 33.7 30.0 - 36.0 g/dL   RDW 14.0 11.5 -  15.5 %   Platelets 168 150 - 400 K/uL   nRBC 0.0 0.0 - 0.2 %  Comprehensive metabolic panel     Status: Abnormal   Collection Time: 02/19/18  7:28 PM  Result Value Ref Range   Sodium 141 135 - 145 mmol/L   Potassium 4.1 3.5 - 5.1 mmol/L   Chloride 112 (H) 98 - 111 mmol/L   CO2 20 (L) 22 - 32 mmol/L   Glucose, Bld 121 (H) 70 - 99 mg/dL   BUN 30 (H) 8 - 23 mg/dL   Creatinine, Ser 1.82 (H) 0.61 - 1.24 mg/dL   Calcium 9.1 8.9 - 10.3 mg/dL   Total Protein 6.8 6.5 - 8.1 g/dL   Albumin 3.9 3.5 - 5.0 g/dL   AST 22 15 - 41 U/L   ALT 15 0 - 44 U/L   Alkaline Phosphatase 70 38 - 126 U/L   Total Bilirubin 1.3 (H) 0.3 - 1.2 mg/dL   GFR calc non  Af Amer 30 (L) >60 mL/min   GFR calc Af Amer 35 (L) >60 mL/min   Anion gap 9 5 - 15  Troponin I     Status: None   Collection Time: 02/19/18  7:28 PM  Result Value Ref Range   Troponin I <0.03 <0.03 ng/mL  Urinalysis, Complete w Microscopic     Status: Abnormal   Collection Time: 02/19/18  8:56 PM  Result Value Ref Range   Color, Urine YELLOW (A) YELLOW   APPearance CLEAR (A) CLEAR   Specific Gravity, Urine 1.017 1.005 - 1.030   pH 5.0 5.0 - 8.0   Glucose, UA NEGATIVE NEGATIVE mg/dL   Hgb urine dipstick MODERATE (A) NEGATIVE   Bilirubin Urine NEGATIVE NEGATIVE   Ketones, ur NEGATIVE NEGATIVE mg/dL   Protein, ur NEGATIVE NEGATIVE mg/dL   Nitrite POSITIVE (A) NEGATIVE   Leukocytes, UA TRACE (A) NEGATIVE   RBC / HPF 0-5 0 - 5 RBC/hpf   WBC, UA 6-10 0 - 5 WBC/hpf   Bacteria, UA MANY (A) NONE SEEN   Squamous Epithelial / LPF NONE SEEN 0 - 5   Mucus PRESENT    No results found for this or any previous visit (from the past 240 hour(s)). Creatinine: Recent Labs    02/19/18 1928  CREATININE 1.82*   CT scan personally reviewed detailed in the history of present illness.  Impression/Assessment:  Left distal ureteral calculus with hydronephrosis   Possible urinary tract infection  Plan:  Plan to admit to hospitalist.  Agree with IV antibiotics and urine culture.  Make n.p.o. at midnight.  I discussed the potential need for a left ureteral stent.  Currently, he is afebrile and vital signs stable.  His lab parameters improve and his clinical condition improves, may be able to proceed with trial passage.  Recommend Flomax, IV fluids, strain all urine.  I will obtain a baseline KUB.    Marton Redwood, III 02/19/2018, 11:04 PM

## 2018-02-19 NOTE — ED Provider Notes (Signed)
[facility] Emergency Department Provider Note   ____________________________________________    I have reviewed the triage vital signs and the nursing notes.   HISTORY  Chief Complaint Weakness     HPI Robert [name] is a 84 y.o. male who presents with complaints of weakness.  Patient reports that around 3:00 he had an abrupt onset of very mild pain in his left flank which quickly abated, however afterwards he felt quite weak and fatigued and like he had no energy.  His wife reports that he had a difficult time getting off the toilet.  He denies fevers or chills.  No chest pain or palpitations.  No shortness of breath or cough.  Denies dysuria or hematuria.  Does report a very distant history of kidney stones.  No nausea or vomiting.  He reports he feels better now   Past Medical History:  Diagnosis Date  . Aortic atherosclerosis ([place]) 03/01/2016  . Arthritis   . High cholesterol   . Hypertension   . TIA (transient ischemic attack)     Patient Active Problem List   Diagnosis Date Noted  . Sepsis ([place]) 02/01/2017  . HCAP (healthcare-associated pneumonia) 02/01/2017  . AKI (acute kidney injury) ([place]) 02/01/2017  . Essential hypertension with goal blood pressure less than 140/90 10/03/2016  . History of CVA (cerebrovascular accident) 10/03/2016  . Pure hypercholesterolemia 10/03/2016  . Other male erectile dysfunction 06/27/2016  . Aortic atherosclerosis ([place]) 03/01/2016  . Vaccine counseling 12/30/2015    Past Surgical History:  Procedure Laterality Date  . LEG SURGERY    . TOTAL HIP ARTHROPLASTY Bilateral     Prior to Admission medications   Medication Sig Start Date End Date Taking? Authorizing Provider  amLODipine (NORVASC) 5 MG tablet Take 5 mg by mouth daily.    [provider]  aspirin EC 325 MG tablet Take 81 mg by mouth daily.    [provider]  atorvastatin (LIPITOR) 10 MG tablet Take 10 mg by mouth daily.     [provider]  guaiFENesin-dextromethorphan (ROBITUSSIN DM) 100-10 MG/5ML syrup Take 5 mLs by mouth every 4 (four) hours as needed for cough. 02/03/17   Hillary Bow, MD  hydrALAZINE (APRESOLINE) 25 MG tablet Take 25 mg by mouth 3 (three) times daily.    [provider]  Melatonin 10 MG TABS Take 10 mg by mouth at bedtime as needed (Sleep).     [provider]  Omega-3 Fatty Acids (FISH OIL PO) Take 1 capsule by mouth daily.    [provider]     Allergies Morphine and related and Tramadol  Family History  Problem Relation Age of Onset  . Prostate cancer Neg Hx   . Bladder Cancer Neg Hx   . Kidney cancer Neg Hx     Social History Social History   Tobacco Use  . Smoking status: Former Research scientist (life sciences)  . Smokeless tobacco: Never Used  Substance Use Topics  . Alcohol use: Yes    Alcohol/week: 7.0 standard drinks    Types: 7 Glasses of wine per week  . Drug use: No    Review of Systems  Constitutional: As above Eyes: No visual changes.  ENT: No sore throat. Cardiovascular: Denies chest pain. Respiratory: Denies shortness of breath. Gastrointestinal: As above Genitourinary: As above Musculoskeletal: Negative for back pain. Skin: Negative for rash. Neurological: Negative for headaches   ____________________________________________   PHYSICAL EXAM:  VITAL SIGNS: ED Triage Vitals  Enc Vitals Group  BP 02/19/18 1915 (!) 154/78     Pulse Rate 02/19/18 1915 85     Resp 02/19/18 1915 17     Temp 02/19/18 1915 97.6 F (36.4 C)     Temp Source 02/19/18 1915 Oral     SpO2 02/19/18 1915 94 %     Weight 02/19/18 1916 95.3 kg (210 lb)     Height 02/19/18 1916 1.829 m (6')     Head Circumference --      Peak Flow --      Pain Score 02/19/18 1916 0     Pain Loc --      Pain Edu? --      Excl. in [place]? --     Constitutional: Alert and oriented.  Eyes: Conjunctivae are normal.   Nose: No congestion/rhinnorhea. Mouth/Throat: Mucous  membranes are moist.    Cardiovascular: Normal rate, regular rhythm. Grossly normal heart sounds.  Good peripheral circulation. Respiratory: Normal respiratory effort.  No retractions. Lungs CTAB. Gastrointestinal: Soft and nontender. No distention.  No CVA tenderness.  Musculoskeletal: No lower extremity tenderness nor edema.  Warm and well perfused Neurologic:  Normal speech and language. No gross focal neurologic deficits are appreciated.  Skin:  Skin is warm, dry and intact. No rash noted. Psychiatric: Mood and affect are normal. Speech and behavior are normal.  ____________________________________________   LABS (all labs ordered are listed, but only abnormal results are displayed)  Labs Reviewed  CBC - Abnormal; Notable for the following components:      Result Value   WBC 16.5 (*)    All other components within normal limits  COMPREHENSIVE METABOLIC PANEL - Abnormal; Notable for the following components:   Chloride 112 (*)    CO2 20 (*)    Glucose, Bld 121 (*)    BUN 30 (*)    Creatinine, Ser 1.82 (*)    Total Bilirubin 1.3 (*)    GFR calc non Af Amer 30 (*)    GFR calc Af Amer 35 (*)    All other components within normal limits  URINALYSIS, COMPLETE (UACMP) WITH MICROSCOPIC - Abnormal; Notable for the following components:   Color, Urine YELLOW (*)    APPearance CLEAR (*)    Hgb urine dipstick MODERATE (*)    Nitrite POSITIVE (*)    Leukocytes, UA TRACE (*)    Bacteria, UA MANY (*)    All other components within normal limits  TROPONIN I   ____________________________________________  EKG  ED ECG REPORT I, Lavonia Drafts, the attending physician, personally viewed and interpreted this ECG.  Date: 02/19/2018  Rhythm: normal sinus rhythm QRS Axis: normal Intervals: normal ST/T Wave abnormalities: normal Narrative Interpretation: no evidence of acute ischemia  ____________________________________________  RADIOLOGY  CT demonstrates 5 mm left ureteral  stone ____________________________________________   PROCEDURES  Procedure(s) performed: No  Procedures   Critical Care performed: yes  CRITICAL CARE Performed by: Lavonia Drafts   Total critical care time: 30 minutes  Critical care time was exclusive of separately billable procedures and treating other patients.  Critical care was necessary to treat or prevent imminent or life-threatening deterioration.  Critical care was time spent personally by me on the following activities: development of treatment plan with patient and/or surrogate as well as nursing, discussions with consultants, evaluation of patient's response to treatment, examination of patient, obtaining history from patient or surrogate, ordering and performing treatments and interventions, ordering and review of laboratory studies, ordering and review of radiographic studies, pulse oximetry and  re-evaluation of patient's condition.  ____________________________________________   INITIAL IMPRESSION / ASSESSMENT AND PLAN / ED COURSE  Pertinent labs & imaging results that were available during my care of the patient were reviewed by me and considered in my medical decision making (see chart for details).  Patient presents with weakness as described above.  Currently feels quite well.  He has no pain.  He does describe a very brief episode of left flank pain.  States he has not had a kidney stone in 40 years.  Denies dysuria.  We will check labs, chest x-ray, urinalysis and reevaluate  Lab work demonstrates elevated white blood cell count, urinalysis consistent with UTI, will order IV Rocephin.  Given brief episode of pain, we will obtain CT renal stone study  CT scan demonstrates 5 mm ureteral stone.  Discussed this in conjunction with the urinary tract infection with Dr. Gloriann Loan of urology (on-call) who feels no need for acute intervention at this time.  I will admit the patient to the hospitalist service for IV  antibiotics and because he continues to feel weak    ____________________________________________   FINAL CLINICAL IMPRESSION(S) / ED DIAGNOSES  Final diagnoses:  Kidney stone  Lower urinary tract infectious disease        Note:  This document was prepared using Dragon voice recognition software and may include unintentional dictation errors.    Lavonia Drafts, MD 02/19/18 2210

## 2018-02-19 NOTE — H&P (Signed)
[facility] NAME: Robert [name]    MR#:  [id number]  DATE OF BIRTH:  04-Dec-1920  DATE OF ADMISSION:  02/19/2018  PRIMARY CARE PHYSICIAN: Dion Body, MD   REQUESTING/REFERRING PHYSICIAN: Corky Downs, MD  CHIEF COMPLAINT:   Chief Complaint  Patient presents with  . Weakness    HISTORY OF PRESENT ILLNESS:  Robert [name]  is a 84 y.o. male who presents with chief complaint as above.  Patient presents to the ED with left-sided renal colic and weakness.  Here in the ED he was found to have a left-sided 5 mm ureteral kidney stone as well as UTI.  Urology, Dr. Gloriann Loan, contacted by ED physician and felt that the patient did not need emergent intervention.  IV antibiotics ordered and hospitalist called for admission  PAST MEDICAL HISTORY:   Past Medical History:  Diagnosis Date  . Aortic atherosclerosis ([place]) 03/01/2016  . Arthritis   . High cholesterol   . Hypertension   . TIA (transient ischemic attack)      PAST SURGICAL HISTORY:   Past Surgical History:  Procedure Laterality Date  . LEG SURGERY    . TOTAL HIP ARTHROPLASTY Bilateral      SOCIAL HISTORY:   Social History   Tobacco Use  . Smoking status: Former Research scientist (life sciences)  . Smokeless tobacco: Never Used  Substance Use Topics  . Alcohol use: Yes    Alcohol/week: 7.0 standard drinks    Types: 7 Glasses of wine per week     FAMILY HISTORY:   Family History  Problem Relation Age of Onset  . Prostate cancer Neg Hx   . Bladder Cancer Neg Hx   . Kidney cancer Neg Hx      DRUG ALLERGIES:   Allergies  Allergen Reactions  . Morphine And Related Nausea And Vomiting  . Tramadol Nausea And Vomiting    MEDICATIONS AT HOME:   Prior to Admission medications   Medication Sig Start Date End Date Taking? Authorizing Provider  amLODipine (NORVASC) 5 MG tablet Take 5 mg by mouth daily.   Yes [provider]  aspirin EC 325 MG tablet Take 81 mg by mouth  daily.   Yes [provider]  atorvastatin (LIPITOR) 10 MG tablet Take 10 mg by mouth at bedtime.    Yes [provider]  guaiFENesin-dextromethorphan (ROBITUSSIN DM) 100-10 MG/5ML syrup Take 5 mLs by mouth every 4 (four) hours as needed for cough. 02/03/17  Yes Sudini, Alveta Heimlich, MD  losartan (COZAAR) 50 MG tablet Take 50 mg by mouth at bedtime.   Yes [provider]  Omega-3 Fatty Acids (FISH OIL PO) Take 1 capsule by mouth daily.   Yes [provider]    REVIEW OF SYSTEMS:  Review of Systems  Constitutional: Negative for chills, fever, malaise/fatigue and weight loss.  HENT: Negative for ear pain, hearing loss and tinnitus.   Eyes: Negative for blurred vision, double vision, pain and redness.  Respiratory: Negative for cough, hemoptysis and shortness of breath.   Cardiovascular: Negative for chest pain, palpitations, orthopnea and leg swelling.  Gastrointestinal: Negative for abdominal pain, constipation, diarrhea, nausea and vomiting.  Genitourinary: Positive for dysuria. Negative for frequency and hematuria.       Renal colic  Musculoskeletal: Negative for back pain, joint pain and neck pain.  Skin:       No acne, rash, or lesions  Neurological: Positive for weakness. Negative for dizziness, tremors and focal weakness.  Endo/Heme/Allergies:  Negative for polydipsia. Does not bruise/bleed easily.  Psychiatric/Behavioral: Negative for depression. The patient is not nervous/anxious and does not have insomnia.      VITAL SIGNS:   Vitals:   02/19/18 2130 02/19/18 2200 02/19/18 2300 02/19/18 2330  BP: (!) 141/92 (!) 171/93 (!) 153/101 (!) 168/79  Pulse:  64 87 77  Resp: (!) 22 (!) 21 18 20   Temp:      TempSrc:      SpO2:  94% 97% 94%  Weight:      Height:       Wt Readings from Last 3 Encounters:  02/19/18 95.3 kg  02/01/17 100.2 kg  11/03/16 96.2 kg    PHYSICAL EXAMINATION:  Physical Exam  Vitals reviewed. Constitutional: He is oriented  to person, place, and time. He appears well-developed and well-nourished. No distress.  HENT:  Head: Normocephalic and atraumatic.  Mouth/Throat: Oropharynx is clear and moist.  Eyes: Pupils are equal, round, and reactive to light. Conjunctivae and EOM are normal. No scleral icterus.  Neck: Normal range of motion. Neck supple. No JVD present. No thyromegaly present.  Cardiovascular: Normal rate, regular rhythm and intact distal pulses. Exam reveals no gallop and no friction rub.  No murmur heard. Respiratory: Effort normal and breath sounds normal. No respiratory distress. He has no wheezes. He has no rales.  GI: Soft. Bowel sounds are normal. He exhibits no distension. There is tenderness (Left lower quadrant).  Musculoskeletal: Normal range of motion. He exhibits no edema.  No arthritis, no gout  Lymphadenopathy:    He has no cervical adenopathy.  Neurological: He is alert and oriented to person, place, and time. No cranial nerve deficit.  No dysarthria, no aphasia  Skin: Skin is warm and dry. No rash noted. No erythema.  Psychiatric: He has a normal mood and affect. His behavior is normal. Judgment and thought content normal.    LABORATORY PANEL:   CBC Recent Labs  Lab 02/19/18 1928  WBC 16.5*  HGB 14.8  HCT 43.9  PLT 168   ------------------------------------------------------------------------------------------------------------------  Chemistries  Recent Labs  Lab 02/19/18 1928  NA 141  K 4.1  CL 112*  CO2 20*  GLUCOSE 121*  BUN 30*  CREATININE 1.82*  CALCIUM 9.1  AST 22  ALT 15  ALKPHOS 70  BILITOT 1.3*   ------------------------------------------------------------------------------------------------------------------  Cardiac Enzymes Recent Labs  Lab 02/19/18 1928  TROPONINI <0.03   ------------------------------------------------------------------------------------------------------------------  RADIOLOGY:  Dg Chest 2 View  Result Date:  02/19/2018 CLINICAL DATA:  Weakness EXAM: CHEST - 2 VIEW COMPARISON:  02/03/2017 FINDINGS: Chronic elevation of the left diaphragm. No pleural effusion. Streaky atelectasis or scarring at the left base. Stable cardiomegaly with aortic atherosclerosis. No pneumothorax. IMPRESSION: 1. Stable cardiomegaly 2. Streaky atelectasis or scarring at the left lung base. Electronically Signed   By: Donavan Foil M.D.   On: 02/19/2018 20:06   Ct Renal Stone Study  Result Date: 02/19/2018 CLINICAL DATA:  Flank pain. Increasing weakness. EXAM: CT ABDOMEN AND PELVIS WITHOUT CONTRAST TECHNIQUE: Multidetector CT imaging of the abdomen and pelvis was performed following the standard protocol without IV contrast. COMPARISON:  Renal ultrasound 10/05/2016 FINDINGS: Lower chest: Mild scarring or fibrosis in the lung bases. No pleural effusion. Hepatobiliary: No focal liver abnormality is seen. Gallstones measure up to approximately 2 cm in size without evidence of pericholecystic inflammation. There is no biliary dilatation. Pancreas: Unremarkable. Spleen: Unremarkable. Adrenals/Urinary Tract: Unremarkable adrenal glands. 5 mm stone in the distal left ureter with mild left hydroureteronephrosis.  Additional nonobstructing renal calculi bilaterally measuring up to 5-6 mm. Bladder partially obscured by streak artifact from hip arthroplasties. Stomach/Bowel: The stomach is collapsed. There is no evidence of bowel obstruction. The appendix is unremarkable. Vascular/Lymphatic: Abdominal aortic atherosclerosis without aneurysm. No enlarged lymph nodes. Reproductive: Prostate obscured by streak artifact. Other: No intraperitoneal free fluid. Small fat containing left inguinal hernia. Musculoskeletal: Bilateral hip arthroplasties. Diffuse lumbar disc degeneration with interbody ankylosis at L2-3. IMPRESSION: 1. 5 mm distal left ureteral stone with mild hydroureteronephrosis. 2. Nonobstructing bilateral renal calculi. 3. Cholelithiasis. 4.   Aortic Atherosclerosis (ICD10-I70.0). Electronically Signed   By: Logan Bores M.D.   On: 02/19/2018 21:55    EKG:   Orders placed or performed during the hospital encounter of 02/19/18  . ED EKG  . ED EKG  . EKG 12-Lead  . EKG 12-Lead    IMPRESSION AND PLAN:  Principal Problem:   UTI (urinary tract infection) -IV antibiotics in place, urine culture sent Active Problems:   Acute on chronic renal failure (HCC) -likely related to his kidney stone and UTI, IV fluids on board, treatment as above and below, avoid nephrotoxins   Left nephrolithiasis -IV fluids and Flomax tonight, stone is 5 mm, hopefully this will pass on its own but if not urology is on board to potentially place a ureteral stent   HTN (hypertension) -home dose antihypertensives   Pure hypercholesterolemia -Home dose antilipid  Chart review performed and case discussed with ED provider. Labs, imaging and/or ECG reviewed by provider and discussed with patient/family. Management plans discussed with the patient and/or family.  DVT PROPHYLAXIS: SubQ heparin  GI PROPHYLAXIS:  None   ADMISSION STATUS: Inpatient     CODE STATUS: Full Code Status History    Date Active Date Inactive Code Status Order ID Comments User Context   02/01/2017 2317 02/03/2017 2117 Full Code [id number]  Lance Coon, MD ED    Advance Directive Documentation     Most Recent Value  Type of Advance Directive  Healthcare Power of Heath, Living will  Pre-existing out of facility DNR order (yellow form or pink MOST form)  -  "MOST" Form in Place?  -      TOTAL TIME TAKING CARE OF THIS PATIENT: 45 minutes.   Robert [name] 02/19/2018, 11:50 PM  [facility]  Office  [phone]  CC: Primary care physician; Dion Body, MD  Note:  This document was prepared using Dragon voice recognition software and may include unintentional dictation errors.

## 2018-02-19 NOTE — ED Triage Notes (Addendum)
Pt arrived via [facility] from home with c/o increasing weakness over the last two days. EMS states the pt wife stated that the pt has not been able to get up like normal and has had some difficulty standing tonight and slid out of the bed. EMS states the pt had some flank pain while in the bed but it went away when the pt went to stand up. No pain on arrival.

## 2018-02-20 ENCOUNTER — Encounter: Payer: Self-pay | Admitting: Urology

## 2018-02-20 ENCOUNTER — Inpatient Hospital Stay: Payer: Medicare Other | Admitting: Certified Registered Nurse Anesthetist

## 2018-02-20 ENCOUNTER — Encounter
Admission: EM | Disposition: A | Discharge status: Skilled Nursing Facility | Payer: Self-pay | Source: Home / Self Care | Attending: Internal Medicine

## 2018-02-20 DIAGNOSIS — N179 Acute kidney failure, unspecified: Secondary | ICD-10-CM

## 2018-02-20 DIAGNOSIS — N201 Calculus of ureter: Secondary | ICD-10-CM

## 2018-02-20 DIAGNOSIS — N200 Calculus of kidney: Secondary | ICD-10-CM

## 2018-02-20 DIAGNOSIS — N184 Chronic kidney disease, stage 4 (severe): Secondary | ICD-10-CM

## 2018-02-20 DIAGNOSIS — N10 Acute tubulo-interstitial nephritis: Secondary | ICD-10-CM

## 2018-02-20 HISTORY — PX: CYSTOSCOPY WITH STENT PLACEMENT: SHX5790

## 2018-02-20 LAB — BASIC METABOLIC PANEL
Anion gap: 8 (ref 5–15)
BUN: 28 mg/dL — AB (ref 8–23)
CO2: 24 mmol/L (ref 22–32)
Calcium: 8.8 mg/dL — ABNORMAL LOW (ref 8.9–10.3)
Chloride: 110 mmol/L (ref 98–111)
Creatinine, Ser: 1.95 mg/dL — ABNORMAL HIGH (ref 0.61–1.24)
GFR, EST AFRICAN AMERICAN: 32 mL/min — AB (ref >60)
GFR, EST NON AFRICAN AMERICAN: 28 mL/min — AB (ref >60)
Glucose, Bld: 108 mg/dL — ABNORMAL HIGH (ref 70–99)
Potassium: 3.8 mmol/L (ref 3.5–5.1)
SODIUM: 142 mmol/L (ref 135–145)

## 2018-02-20 LAB — MRSA PCR SCREENING: MRSA BY PCR: NEGATIVE

## 2018-02-20 LAB — CBC
HEMATOCRIT: 42.3 % (ref 39.0–52.0)
HEMOGLOBIN: 13.9 g/dL (ref 13.0–17.0)
MCH: 32 pg (ref 26.0–34.0)
MCHC: 32.9 g/dL (ref 30.0–36.0)
MCV: 97.5 fL (ref 80.0–100.0)
NRBC: 0 % (ref 0.0–0.2)
Platelets: 176 10*3/uL (ref 150–400)
RBC: 4.34 MIL/uL (ref 4.22–5.81)
RDW: 13.9 % (ref 11.5–15.5)
WBC: 15.2 10*3/uL — AB (ref 4.0–10.5)

## 2018-02-20 SURGERY — CYSTOSCOPY, WITH STENT INSERTION
Anesthesia: General | Laterality: Left

## 2018-02-20 MED ORDER — FENTANYL CITRATE (PF) 100 MCG/2ML IJ SOLN: 25.0000 ug | INTRAMUSCULAR | Status: DC | PRN

## 2018-02-20 MED ORDER — OXYCODONE HCL 5 MG PO TABS: 5.0000 mg | ORAL_TABLET | ORAL | Status: DC | PRN

## 2018-02-20 MED ORDER — FENTANYL CITRATE (PF) 100 MCG/2ML IJ SOLN
INTRAMUSCULAR | Status: DC | PRN
  Administered 2018-02-20: 50 ug via INTRAVENOUS

## 2018-02-20 MED ORDER — MORPHINE SULFATE (PF) 2 MG/ML IV SOLN: 2.0000 mg | INTRAVENOUS | Status: DC | PRN

## 2018-02-20 MED ORDER — ACETAMINOPHEN 325 MG PO TABS: 650.0000 mg | ORAL_TABLET | Freq: Four times a day (QID) | ORAL | Status: DC | PRN

## 2018-02-20 MED ORDER — AMLODIPINE BESYLATE 5 MG PO TABS: 5.0000 mg | ORAL_TABLET | Freq: Every day | ORAL | Status: DC

## 2018-02-20 MED ORDER — ONDANSETRON HCL 4 MG/2ML IJ SOLN
4.0000 mg | Freq: Four times a day (QID) | INTRAMUSCULAR | Status: DC | PRN
  Administered 2018-02-20: 4 mg via INTRAVENOUS

## 2018-02-20 MED ORDER — ATORVASTATIN CALCIUM 10 MG PO TABS
10.0000 mg | ORAL_TABLET | Freq: Every day | ORAL | Status: DC
  Administered 2018-02-20 – 2018-02-21 (×3): 10 mg via ORAL
  Filled 2018-02-20 (×3): qty 1

## 2018-02-20 MED ORDER — TAMSULOSIN HCL 0.4 MG PO CAPS
0.4000 mg | ORAL_CAPSULE | Freq: Every day | ORAL | Status: DC
  Administered 2018-02-20 – 2018-02-21 (×2): 0.4 mg via ORAL
  Filled 2018-02-20 (×3): qty 1

## 2018-02-20 MED ORDER — FENTANYL CITRATE (PF) 100 MCG/2ML IJ SOLN
INTRAMUSCULAR | Status: AC
  Filled 2018-02-20: qty 2

## 2018-02-20 MED ORDER — SODIUM CHLORIDE 0.9 % IV SOLN
1.0000 g | INTRAVENOUS | Status: DC
  Administered 2018-02-20 – 2018-02-21 (×2): 1 g via INTRAVENOUS
  Filled 2018-02-20: qty 10
  Filled 2018-02-20 (×2): qty 1

## 2018-02-20 MED ORDER — LIDOCAINE HCL (PF) 2 % IJ SOLN
INTRAMUSCULAR | Status: AC
  Filled 2018-02-20: qty 10

## 2018-02-20 MED ORDER — CIPROFLOXACIN IN D5W 400 MG/200ML IV SOLN
INTRAVENOUS | Status: AC
  Filled 2018-02-20: qty 200

## 2018-02-20 MED ORDER — ASPIRIN EC 81 MG PO TBEC
81.0000 mg | DELAYED_RELEASE_TABLET | Freq: Every day | ORAL | Status: DC
  Administered 2018-02-20 – 2018-02-22 (×3): 81 mg via ORAL
  Filled 2018-02-20 (×3): qty 1

## 2018-02-20 MED ORDER — LIDOCAINE HCL (CARDIAC) PF 100 MG/5ML IV SOSY
PREFILLED_SYRINGE | INTRAVENOUS | Status: DC | PRN
  Administered 2018-02-20: 100 mg via INTRAVENOUS

## 2018-02-20 MED ORDER — ONDANSETRON HCL 4 MG/2ML IJ SOLN: 4.0000 mg | Freq: Once | INTRAMUSCULAR | Status: DC | PRN

## 2018-02-20 MED ORDER — IOPAMIDOL (ISOVUE-300) INJECTION 61%
INTRAVENOUS | Status: DC | PRN
  Administered 2018-02-20: 5 mL via URETHRAL

## 2018-02-20 MED ORDER — ACETAMINOPHEN 650 MG RE SUPP: 650.0000 mg | Freq: Four times a day (QID) | RECTAL | Status: DC | PRN

## 2018-02-20 MED ORDER — ONDANSETRON HCL 4 MG PO TABS: 4.0000 mg | ORAL_TABLET | Freq: Four times a day (QID) | ORAL | Status: DC | PRN

## 2018-02-20 MED ORDER — SUGAMMADEX SODIUM 200 MG/2ML IV SOLN
INTRAVENOUS | Status: DC | PRN
  Administered 2018-02-20: 190.6 mg via INTRAVENOUS

## 2018-02-20 MED ORDER — PROPOFOL 10 MG/ML IV BOLUS
INTRAVENOUS | Status: DC | PRN
  Administered 2018-02-20: 100 mg via INTRAVENOUS
  Administered 2018-02-20: 30 mg via INTRAVENOUS

## 2018-02-20 MED ORDER — ROCURONIUM BROMIDE 100 MG/10ML IV SOLN
INTRAVENOUS | Status: DC | PRN
  Administered 2018-02-20: 10 mg via INTRAVENOUS
  Administered 2018-02-20: 20 mg via INTRAVENOUS

## 2018-02-20 MED ORDER — CIPROFLOXACIN IN D5W 400 MG/200ML IV SOLN
INTRAVENOUS | Status: DC | PRN
  Administered 2018-02-20: 400 mg via INTRAVENOUS

## 2018-02-20 MED ORDER — LOSARTAN POTASSIUM 50 MG PO TABS
50.0000 mg | ORAL_TABLET | Freq: Every day | ORAL | Status: DC
  Administered 2018-02-20 – 2018-02-21 (×3): 50 mg via ORAL
  Filled 2018-02-20 (×3): qty 1

## 2018-02-20 MED ORDER — SODIUM CHLORIDE 0.9 % IV SOLN
INTRAVENOUS | Status: DC
  Administered 2018-02-20: 02:00:00 via INTRAVENOUS

## 2018-02-20 MED ORDER — SEVOFLURANE IN SOLN
RESPIRATORY_TRACT | Status: AC
  Filled 2018-02-20: qty 250

## 2018-02-20 MED ORDER — HEPARIN SODIUM (PORCINE) 5000 UNIT/ML IJ SOLN
5000.0000 [IU] | Freq: Three times a day (TID) | INTRAMUSCULAR | Status: DC
  Administered 2018-02-20 – 2018-02-22 (×6): 5000 [IU] via SUBCUTANEOUS
  Filled 2018-02-20 (×6): qty 1

## 2018-02-20 MED ORDER — SUCCINYLCHOLINE CHLORIDE 20 MG/ML IJ SOLN
INTRAMUSCULAR | Status: DC | PRN
  Administered 2018-02-20: 80 mg via INTRAVENOUS

## 2018-02-20 SURGICAL SUPPLY — 30 items
BAG DRAIN CYSTO-URO LG1000N (MISCELLANEOUS) ×3 IMPLANT
BRUSH SCRUB EZ 1% IODOPHOR (MISCELLANEOUS) ×3 IMPLANT
BULB IRRIG PATHFIND (MISCELLANEOUS) ×3 IMPLANT
CATH URETL 5X70 OPEN END (CATHETERS) IMPLANT
CNTNR SPEC 2.5X3XGRAD LEK (MISCELLANEOUS)
CONT SPEC 4OZ STER OR WHT (MISCELLANEOUS)
CONTAINER SPEC 2.5X3XGRAD LEK (MISCELLANEOUS) IMPLANT
DRAPE UTILITY 15X26 TOWEL STRL (DRAPES) ×3 IMPLANT
FIBER LASER LITHO 273 (Laser) IMPLANT
GLOVE BIOGEL PI IND STRL 7.5 (GLOVE) ×1 IMPLANT
GLOVE BIOGEL PI INDICATOR 7.5 (GLOVE) ×2
GOWN STRL REUS W/ TWL LRG LVL3 (GOWN DISPOSABLE) ×2 IMPLANT
GOWN STRL REUS W/TWL LRG LVL3 (GOWN DISPOSABLE) ×4
INTRODUCER DILATOR DOUBLE (INTRODUCER) IMPLANT
KIT TURNOVER CYSTO (KITS) ×3 IMPLANT
PACK CYSTO AR (MISCELLANEOUS) ×3 IMPLANT
SENSORWIRE 0.038 NOT ANGLED (WIRE) ×3
SET CYSTO W/LG BORE CLAMP LF (SET/KITS/TRAYS/PACK) ×3 IMPLANT
SHEATH URETERAL 12FRX35CM (MISCELLANEOUS) IMPLANT
SOL .9 NS 3000ML IRR  AL (IV SOLUTION) ×2
SOL .9 NS 3000ML IRR UROMATIC (IV SOLUTION) ×1 IMPLANT
STENT URET 6FRX24 CONTOUR (STENTS) IMPLANT
STENT URET 6FRX26 CONTOUR (STENTS) IMPLANT
STENT URET 6FRX28 CONTOUR (STENTS) ×3 IMPLANT
SURGILUBE 2OZ TUBE FLIPTOP (MISCELLANEOUS) ×3 IMPLANT
SYR 10ML LL (SYRINGE) ×3 IMPLANT
TUBING ART PRESS 48 MALE/FEM (TUBING) ×3 IMPLANT
VALVE UROSEAL ADJ ENDO (VALVE) IMPLANT
WATER STERILE IRR 1000ML POUR (IV SOLUTION) ×3 IMPLANT
WIRE SENSOR 0.038 NOT ANGLED (WIRE) ×1 IMPLANT

## 2018-02-20 NOTE — Anesthesia Postprocedure Evaluation (Signed)
Anesthesia Post Note  Patient: Robert [name]  Procedure(s) Performed: CYSTOSCOPY WITH STENT PLACEMENT (Left )  Patient location during evaluation: PACU Anesthesia Type: General Level of consciousness: awake and alert Pain management: pain level controlled Vital Signs Assessment: post-procedure vital signs reviewed and stable Respiratory status: spontaneous breathing and respiratory function stable Cardiovascular status: stable Anesthetic complications: no     Last Vitals:  Vitals:   02/20/18 1334 02/20/18 1400  BP: 115/85 131/67  Pulse: 83   Resp: 17   Temp: 37.3 C   SpO2: 92%     Last Pain:  Vitals:   02/20/18 1334  TempSrc:   PainSc: 0-No pain                 [name]

## 2018-02-20 NOTE — Progress Notes (Signed)
[place] at [place] NAME: Robert [name]    MR#:  [id number]  DATE OF BIRTH:  25-Feb-1921  SUBJECTIVE:  CHIEF COMPLAINT:   Chief Complaint  Patient presents with  . Weakness  waiting for procedure, family at bedside REVIEW OF SYSTEMS:  Review of Systems  Constitutional: Positive for malaise/fatigue. Negative for chills, fever and weight loss.  HENT: Negative for nosebleeds and sore throat.   Eyes: Negative for blurred vision.  Respiratory: Negative for cough, shortness of breath and wheezing.   Cardiovascular: Negative for chest pain, orthopnea, leg swelling and PND.  Gastrointestinal: Negative for abdominal pain, constipation, diarrhea, heartburn, nausea and vomiting.  Genitourinary: Positive for flank pain. Negative for dysuria and urgency.  Musculoskeletal: Negative for back pain.  Skin: Negative for rash.  Neurological: Negative for dizziness, speech change, focal weakness and headaches.  Endo/Heme/Allergies: Does not bruise/bleed easily.  Psychiatric/Behavioral: Negative for depression.   DRUG ALLERGIES:   Allergies  Allergen Reactions  . Morphine And Related Nausea And Vomiting  . Tramadol Nausea And Vomiting   VITALS:  Blood pressure 135/77, pulse 87, temperature 98 F (36.7 C), temperature source Oral, resp. rate 16, height 6' (1.829 m), weight 95.3 kg, SpO2 92 %. PHYSICAL EXAMINATION:  Physical Exam  Constitutional: He is oriented to person, place, and time.  HENT:  Head: Normocephalic and atraumatic.  Eyes: Pupils are equal, round, and reactive to light. Conjunctivae and EOM are normal.  Neck: Normal range of motion. Neck supple. No tracheal deviation present. No thyromegaly present.  Cardiovascular: Normal rate, regular rhythm and normal heart sounds.  Pulmonary/Chest: Effort normal and breath sounds normal. No respiratory distress. He has no wheezes. He exhibits no tenderness.  Abdominal: Soft. Bowel sounds are  normal. He exhibits no distension. There is no tenderness.  Musculoskeletal: Normal range of motion.  Neurological: He is alert and oriented to person, place, and time. No cranial nerve deficit.  Skin: Skin is warm and dry. No rash noted.   LABORATORY PANEL:  Male CBC Recent Labs  Lab 02/20/18 0557  WBC 15.2*  HGB 13.9  HCT 42.3  PLT 176   ------------------------------------------------------------------------------------------------------------------ Chemistries  Recent Labs  Lab 02/19/18 1928 02/20/18 0557  NA 141 142  K 4.1 3.8  CL 112* 110  CO2 20* 24  GLUCOSE 121* 108*  BUN 30* 28*  CREATININE 1.82* 1.95*  CALCIUM 9.1 8.8*  AST 22  --   ALT 15  --   ALKPHOS 70  --   BILITOT 1.3*  --    RADIOLOGY:  Dg Chest 2 View  Result Date: 02/19/2018 CLINICAL DATA:  Weakness EXAM: CHEST - 2 VIEW COMPARISON:  02/03/2017 FINDINGS: Chronic elevation of the left diaphragm. No pleural effusion. Streaky atelectasis or scarring at the left base. Stable cardiomegaly with aortic atherosclerosis. No pneumothorax. IMPRESSION: 1. Stable cardiomegaly 2. Streaky atelectasis or scarring at the left lung base. Electronically Signed   By: Donavan Foil M.D.   On: 02/19/2018 20:06   Dg Abd 1 View  Result Date: 02/20/2018 CLINICAL DATA:  Renal calculi. EXAM: ABDOMEN - 1 VIEW COMPARISON:  CT 02/19/2018 FINDINGS: The left ureteral stones seen at CT is not definitely demonstrated radiographically. There is a large amount of stool in the pelvis. Vascular calcifications. Gas and stool throughout the colon. No small or large bowel distention. Degenerative changes in the spine. Postoperative changes in both hips. IMPRESSION: The left ureteral stones seen at CT are not definitely demonstrated radiographically.  Large amount of stool in the pelvis. Nonobstructive bowel gas pattern. Electronically Signed   By: Lucienne Capers M.D.   On: 02/20/2018 00:09   Ct Renal Stone Study  Result Date:  02/19/2018 CLINICAL DATA:  Flank pain. Increasing weakness. EXAM: CT ABDOMEN AND PELVIS WITHOUT CONTRAST TECHNIQUE: Multidetector CT imaging of the abdomen and pelvis was performed following the standard protocol without IV contrast. COMPARISON:  Renal ultrasound 10/05/2016 FINDINGS: Lower chest: Mild scarring or fibrosis in the lung bases. No pleural effusion. Hepatobiliary: No focal liver abnormality is seen. Gallstones measure up to approximately 2 cm in size without evidence of pericholecystic inflammation. There is no biliary dilatation. Pancreas: Unremarkable. Spleen: Unremarkable. Adrenals/Urinary Tract: Unremarkable adrenal glands. 5 mm stone in the distal left ureter with mild left hydroureteronephrosis. Additional nonobstructing renal calculi bilaterally measuring up to 5-6 mm. Bladder partially obscured by streak artifact from hip arthroplasties. Stomach/Bowel: The stomach is collapsed. There is no evidence of bowel obstruction. The appendix is unremarkable. Vascular/Lymphatic: Abdominal aortic atherosclerosis without aneurysm. No enlarged lymph nodes. Reproductive: Prostate obscured by streak artifact. Other: No intraperitoneal free fluid. Small fat containing left inguinal hernia. Musculoskeletal: Bilateral hip arthroplasties. Diffuse lumbar disc degeneration with interbody ankylosis at L2-3. IMPRESSION: 1. 5 mm distal left ureteral stone with mild hydroureteronephrosis. 2. Nonobstructing bilateral renal calculi. 3. Cholelithiasis. 4.  Aortic Atherosclerosis (ICD10-I70.0). Electronically Signed   By: Logan Bores M.D.   On: 02/19/2018 21:55   ASSESSMENT AND PLAN:  84 year old male admitted overnight with left-sided flank pain, weakness, possible UTI, and left 5 mm distal ureteral stone.  * Infected left 5 mm distal ureteral stone - s/p Cystoscopy, left ureteral stent placement by Dr Diamantina Providence 02/20/2018 - Continue broad-spectrum antibiotics and narrow as able - total 10 to 14-day total course of  culture appropriate antibiotics Remove Foley in 2 to 3 days per urology  * UTI (urinary tract infection): based on UA, continue rocephin  * Acute on CKD 2 -likely related to his kidney stone and UTI, IV fluids on board, treatment as above and below, avoid nephrotoxins  * HTN (hypertension) -home dose antihypertensives  * hypercholesterolemia -Home dose antilipid     All the records are reviewed and case discussed with Care Management/Social Worker. Management plans discussed with the patient, family (wife at bedside) and they are in agreement.  CODE STATUS: Full Code  TOTAL TIME TAKING CARE OF THIS PATIENT: 35 minutes.   More than 50% of the time was spent in counseling/coordination of care: YES  POSSIBLE D/C IN 1-2 DAYS, DEPENDING ON CLINICAL CONDITION.   Max Sane M.D on 02/20/2018 at 4:16 PM  Between 7am to 6pm - Pager - [phone]  After 6pm go to [email or web address] - Proofreader  [facility]  Office  [phone]  CC: Primary care physician; Dion Body, MD  Note: This dictation was prepared with Dragon dictation along with smaller phrase technology. Any transcriptional errors that result from this process are unintentional.

## 2018-02-20 NOTE — Op Note (Signed)
Date of procedure: 02/20/18  Preoperative diagnosis:  1. Infected left 5 mm distal ureteral stone  Postoperative diagnosis:  1. Same  Procedure: 1. Cystoscopy, left ureteral stent placement 2. Left retrograde pyelogram with intraoperative interpretation  Surgeon: Nickolas Madrid, MD  Anesthesia: General  Complications: None  Intraoperative findings:  1.  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  Purulent urine in bladder, multiple diverticula, no tumors 3.  Uncomplicated left ureteral stent placement 4.  18 French council Foley placed over wire  EBL: Minimal  Specimens: None  Drains: Left 6 [place] by 28 cm ureteral stent, 18 French council Foley  Indication: Robert [name] is a 84 y.o. patient with weakness and left-sided flank pain and CT showed a 5 mm left distal ureteral stone.  Urine was concerning for infection with bacteria and nitrite positivity.  He agreed to undergo urgent left ureteral stent placement.  After reviewing the management options for treatment, they elected to proceed with the above surgical procedure(s). We have discussed the potential benefits and risks of the procedure, side effects of the proposed treatment, the likelihood of the patient achieving the goals of the procedure, and any potential problems that might occur during the procedure or recuperation. Informed consent has been obtained.  Description of procedure:  The patient was taken to the operating room and general anesthesia was induced.  The patient was placed in the dorsal lithotomy position, prepped and draped in the usual sterile fashion, and preoperative antibiotics(Cipro) were administered. A preoperative time-out was performed.   The 21 French rigid cystoscope was used to intubate the urethra.  There was a 10-15 [place] bulbar urethral stricture that I was able to gently dilate with the rigid cystoscope.  The prostate was moderate in size.  Urine in the  bladder was very purulent.  Thorough inspection of the bladder revealed numerous diverticula trabeculations, but no obvious bladder tumors.  The ureteral orifices were orthotopic bilaterally.  A sensor wire was advanced into the left ureteral orifice and met resistance in the left distal ureter.  An access catheter was advanced over the wire up to the level of the stone we were able to navigate the wire past the stone into the left collecting system.  The access catheter was advanced over the wire and there is a return of frankly purulent urine.  5 cc of contrast were injected which opacified the collecting system showing left hydronephrosis.  The sensor wire was replaced and the access catheter removed.  A 6 French by 28 cm left ureteral stent was placed uneventfully with excellent curl noted in the renal pelvis as well as under direct vision in the bladder.    I then readvanced the sensor wire through the rigid cystoscope and pullback cystoscopy showed mild bleeding at the area of the urethral stricture.  An 33 [place] council Foley was placed over the wire easily with return of purulent urine.  10 cc were placed in the balloon.  Disposition: Stable to PACU  Plan: Return to floor, monitor for fever or hypotension Continue broad-spectrum antibiotics and narrow as able Recommend 10 to 14-day total course of culture appropriate antibiotics Remove Foley in 2 to 3 days We will arrange definitive management with left ureteroscopy, laser lithotripsy, and stent exchange in 2 to 4weeks  Nickolas Madrid, MD

## 2018-02-20 NOTE — Anesthesia Preprocedure Evaluation (Signed)
Anesthesia Evaluation  Patient identified by MRN, date of birth, ID band Patient awake    Reviewed: Allergy & Precautions, NPO status , Patient's Chart, lab work & pertinent test results  History of Anesthesia Complications Negative for: history of anesthetic complications  Airway Mallampati: III       Dental   Pulmonary neg sleep apnea, neg COPD, former smoker,           Cardiovascular hypertension, Pt. on medications (-) Past MI and (-) CHF (-) dysrhythmias (-) Valvular Problems/Murmurs     Neuro/Psych neg Seizures TIA (lost vision in R eye, still not present)   GI/Hepatic Neg liver ROS, neg GERD  ,  Endo/Other  neg diabetes  Renal/GU Renal disease (stones)     Musculoskeletal   Abdominal   Peds  Hematology   Anesthesia Other Findings   Reproductive/Obstetrics                             Anesthesia Physical Anesthesia Plan  ASA: III  Anesthesia Plan: General   Post-op Pain Management:    Induction:   PONV Risk Score and Plan: 2 and Ondansetron and Dexamethasone  Airway Management Planned: Oral ETT  Additional Equipment:   Intra-op Plan:   Post-operative Plan:   Informed Consent: I have reviewed the patients History and Physical, chart, labs and discussed the procedure including the risks, benefits and alternatives for the proposed anesthesia with the patient or authorized representative who has indicated his/her understanding and acceptance.     Plan Discussed with:   Anesthesia Plan Comments:         Anesthesia Quick Evaluation

## 2018-02-20 NOTE — Progress Notes (Signed)
UROLOGY PROGRESS NOTE  Admitted overnight with left distal ureteral stone and possible UTI Afebrile overnight Minimal complaints this morning  Vitals reviewed  Labs reviewed WBC 15.2 Creatinine 1.95   Assessment and plan: Robert [name] is a 84 year old male admitted overnight with left-sided flank pain, weakness, possible UTI, and left 5 mm distal ureteral stone.  I had a long discussion with the patient and his wife about his left distal ureteral stone in the setting of likely urinary tract infection.  I recommended cystoscopy with left ureteral stent placement for decompression in the setting of likely infection.  We discussed the risks of bleeding, infection, stent related symptoms, and less than 5% chance of failed stent placement requiring percutaneous nephrostomy tube.  I discussed at length the need for staged management with follow-up ureteroscopy for definitive management with his infection is treated.  Informed consent obtained, left side marked  OR today for cystoscopy, left ureteral stent placement  Robert Madrid, MD 02/20/2018

## 2018-02-20 NOTE — Anesthesia Procedure Notes (Signed)
Procedure Name: Intubation Date/Time: 02/20/2018 12:30 PM Performed by: Nelda Marseille, CRNA Pre-anesthesia Checklist: Patient identified, Patient being monitored, Timeout performed, Emergency Drugs available and Suction available Patient Re-evaluated:Patient Re-evaluated prior to induction Oxygen Delivery Method: Circle system utilized Preoxygenation: Pre-oxygenation with 100% oxygen Induction Type: IV induction Ventilation: Mask ventilation without difficulty Laryngoscope Size: Mac, 3 and Glidescope Grade View: Grade III Tube type: Oral Tube size: 7.5 mm Number of attempts: 1 Airway Equipment and Method: Stylet and Video-laryngoscopy Placement Confirmation: ETT inserted through vocal cords under direct vision,  positive ETCO2 and breath sounds checked- equal and bilateral Secured at: 21 cm Tube secured with: Tape Dental Injury: Teeth and Oropharynx as per pre-operative assessment  Difficulty Due To: Difficult Airway- due to large tongue, Difficult Airway- due to reduced neck mobility and Difficulty was anticipated

## 2018-02-20 NOTE — Anesthesia Post-op Follow-up Note (Signed)
Anesthesia QCDR form completed.        

## 2018-02-20 NOTE — Transfer of Care (Signed)
Immediate Anesthesia Transfer of Care Note  Patient: Robert [name]  Procedure(s) Performed: CYSTOSCOPY WITH STENT PLACEMENT (Left )  Patient Location: PACU  Anesthesia Type:General  Level of Consciousness: sedated  Airway & Oxygen Therapy: Patient Spontanous Breathing and Patient connected to face mask oxygen  Post-op Assessment: Report given to RN and Post -op Vital signs reviewed and stable  Post vital signs: Reviewed and stable  Last Vitals:  Vitals Value Taken Time  BP    Temp    Pulse 83 02/20/2018  1:03 PM  Resp 17 02/20/2018  1:03 PM  SpO2 98 % 02/20/2018  1:03 PM  Vitals shown include unvalidated device data.  Last Pain:  Vitals:   02/20/18 1115  TempSrc: Temporal  PainSc:          Complications: No apparent anesthesia complications

## 2018-02-20 NOTE — Progress Notes (Signed)
   02/20/18 1000  Clinical Encounter Type  Visited With Patient;Family (Wife, Katharine Look)  Visit Type Initial;Spiritual support  Recommendations Follow-up as requested.  Spiritual Encounters  Spiritual Needs Prayer   Chaplain spoke briefly with patient before he went to sleep. Chaplain spent the majority of the encounter talking with the patient's wife, Katharine Look. She shared part of their spiritual journey and participation/membership in seven different denominations. Further, we spoke about the patient and his upcoming surgery. Chaplain ended the encounter with a prayer for the patient's successful surgery.

## 2018-02-21 LAB — CBC
HCT: 35.9 % — ABNORMAL LOW (ref 39.0–52.0)
Hemoglobin: 11.7 g/dL — ABNORMAL LOW (ref 13.0–17.0)
MCH: 32 pg (ref 26.0–34.0)
MCHC: 32.6 g/dL (ref 30.0–36.0)
MCV: 98.1 fL (ref 80.0–100.0)
PLATELETS: 131 10*3/uL — AB (ref 150–400)
RBC: 3.66 MIL/uL — ABNORMAL LOW (ref 4.22–5.81)
RDW: 14.1 % (ref 11.5–15.5)
WBC: 9 10*3/uL (ref 4.0–10.5)
nRBC: 0 % (ref 0.0–0.2)

## 2018-02-21 LAB — URINALYSIS, COMPLETE (UACMP) WITH MICROSCOPIC
SPECIFIC GRAVITY, URINE: 1.015 (ref 1.005–1.030)
SQUAMOUS EPITHELIAL / LPF: NONE SEEN (ref 0–5)
WBC, UA: >50 WBC/hpf — ABNORMAL HIGH (ref 0–5)

## 2018-02-21 LAB — BASIC METABOLIC PANEL
Anion gap: 6 (ref 5–15)
BUN: 29 mg/dL — ABNORMAL HIGH (ref 8–23)
CALCIUM: 8.4 mg/dL — AB (ref 8.9–10.3)
CO2: 24 mmol/L (ref 22–32)
Chloride: 113 mmol/L — ABNORMAL HIGH (ref 98–111)
Creatinine, Ser: 1.84 mg/dL — ABNORMAL HIGH (ref 0.61–1.24)
GFR calc Af Amer: 34 mL/min — ABNORMAL LOW (ref >60)
GFR, EST NON AFRICAN AMERICAN: 30 mL/min — AB (ref >60)
GLUCOSE: 94 mg/dL (ref 70–99)
POTASSIUM: 4.1 mmol/L (ref 3.5–5.1)
SODIUM: 143 mmol/L (ref 135–145)

## 2018-02-21 MED ORDER — GUAIFENESIN-DM 100-10 MG/5ML PO SYRP
5.0000 mL | ORAL_SOLUTION | ORAL | Status: DC | PRN
  Administered 2018-02-21 – 2018-02-22 (×2): 5 mL via ORAL
  Filled 2018-02-21 (×2): qty 5

## 2018-02-21 NOTE — Progress Notes (Signed)
Urology progress note  Feels well this AM, no complaints  Vitals reviewed, afebrile, hemodynamically stable Alert and oriented this morning Urine light red in Foley  Leukocytosis resolved Creatinine downtrending 1.8 from 1.95  625 cc urine output overnight  Assessment and plan: Robert [name] is a 84 year old male postop day #1 from cystoscopy and left ureteral stent placement for infected 5 mm left distal ureteral stone.  Bulbourethral stricture dilated Intra-Op and Foley should remain 2 to 3 days.  -Send urinalysis from 11/4 for culture -Maintain Foley today -Continue ceftriaxone, recommend 10 to 14 days total of culture appropriate antibiotics, then daily Bactrim DS until time of surgery to prevent UTI -My clinic will arrange follow-up for definitive management with left ureteroscopy, laser lithotripsy, and stent exchange in 2 to 4 weeks  Robert Madrid, MD 02/21/2018

## 2018-02-21 NOTE — Progress Notes (Addendum)
[place] at [place] NAME: Robert [name]    MR#:  [id number]  DATE OF BIRTH:  1920-05-16  SUBJECTIVE:  CHIEF COMPLAINT:   Chief Complaint  Patient presents with  . Weakness  doing well, waiting for urine c/s, dark urine in foley bag REVIEW OF SYSTEMS:  Review of Systems  Constitutional: Positive for malaise/fatigue. Negative for chills, fever and weight loss.  HENT: Negative for nosebleeds and sore throat.   Eyes: Negative for blurred vision.  Respiratory: Negative for cough, shortness of breath and wheezing.   Cardiovascular: Negative for chest pain, orthopnea, leg swelling and PND.  Gastrointestinal: Negative for abdominal pain, constipation, diarrhea, heartburn, nausea and vomiting.  Genitourinary: Positive for flank pain. Negative for dysuria and urgency.  Musculoskeletal: Negative for back pain.  Skin: Negative for rash.  Neurological: Negative for dizziness, speech change, focal weakness and headaches.  Endo/Heme/Allergies: Does not bruise/bleed easily.  Psychiatric/Behavioral: Negative for depression.   DRUG ALLERGIES:   Allergies  Allergen Reactions  . Morphine And Related Nausea And Vomiting  . Tramadol Nausea And Vomiting   VITALS:  Blood pressure 124/63, pulse 67, temperature 98.9 F (37.2 C), temperature source Oral, resp. rate 18, height 6' (1.829 m), weight 95.3 kg, SpO2 93 %. PHYSICAL EXAMINATION:  Physical Exam  Constitutional: He is oriented to person, place, and time.  HENT:  Head: Normocephalic and atraumatic.  Eyes: Pupils are equal, round, and reactive to light. Conjunctivae and EOM are normal.  Neck: Normal range of motion. Neck supple. No tracheal deviation present. No thyromegaly present.  Cardiovascular: Normal rate, regular rhythm and normal heart sounds.  Pulmonary/Chest: Effort normal and breath sounds normal. No respiratory distress. He has no wheezes. He exhibits no tenderness.  Abdominal: Soft.  Bowel sounds are normal. He exhibits no distension. There is no tenderness.  Musculoskeletal: Normal range of motion.  Neurological: He is alert and oriented to person, place, and time. No cranial nerve deficit.  Skin: Skin is warm and dry. No rash noted.   LABORATORY PANEL:  Male CBC Recent Labs  Lab 02/21/18 0306  WBC 9.0  HGB 11.7*  HCT 35.9*  PLT 131*   ------------------------------------------------------------------------------------------------------------------ Chemistries  Recent Labs  Lab 02/19/18 1928  02/21/18 0306  NA 141   < > 143  K 4.1   < > 4.1  CL 112*   < > 113*  CO2 20*   < > 24  GLUCOSE 121*   < > 94  BUN 30*   < > 29*  CREATININE 1.82*   < > 1.84*  CALCIUM 9.1   < > 8.4*  AST 22  --   --   ALT 15  --   --   ALKPHOS 70  --   --   BILITOT 1.3*  --   --    < > = values in this interval not displayed.   RADIOLOGY:  No results found. ASSESSMENT AND PLAN:  84 year old male admitted overnight with left-sided flank pain, weakness, possible UTI, and left 5 mm distal ureteral stone.  * Infected left 5 mm distal ureteral stone - s/p Cystoscopy, left ureteral stent placement by Dr Diamantina Providence 02/20/2018 - Continue Rocephin for now. Unfortunately no urine c/s was sent so Ive requested add on or new one today - total 10 to 14-day total course of culture appropriate antibiotics followed by Bactrim DS until time of surgery for prophylaxis per Urology Remove Foley in 2 to 3 days per  urology  Robert [name] is a 84 year old male postop day #1 from cystoscopy and left ureteral stent placement for infected 5 mm left distal ureteral stone.  Bulbourethral stricture dilated Intra-Op and Foley should remain 2 to 3 days.  * UTI (urinary tract infection): based on UA, continue rocephin, await urine c/s  * Acute on CKD 2 -likely related to his kidney stone and UTI, IV fluids on board, treatment as above and below, avoid nephrotoxins  * HTN (hypertension) -home dose  antihypertensives  * hypercholesterolemia -Home dose antilipid  *chronic diastolic CHF: Well compensated at this time    Back to [facility] tomorrow with HHPT, RN...  All the records are reviewed and case discussed with Care Management/Social Worker. Management plans discussed with the patient, family (wife at bedside) and they are in agreement.  CODE STATUS: Full Code  TOTAL TIME TAKING CARE OF THIS PATIENT: 35 minutes.   More than 50% of the time was spent in counseling/coordination of care: YES  POSSIBLE D/C IN 1 DAYS, DEPENDING ON CLINICAL CONDITION.   Max Sane M.D on 02/21/2018 at 5:21 PM  Between 7am to 6pm - Pager - [phone]  After 6pm go to [email or web address] - Proofreader  [facility] [place] [facility]  Office  [phone]  CC: Primary care physician; Robert Body, MD  Note: This dictation was prepared with Dragon dictation along with smaller phrase technology. Any transcriptional errors that result from this process are unintentional.

## 2018-02-22 MED ORDER — TAMSULOSIN HCL 0.4 MG PO CAPS: 0.4000 mg | ORAL_CAPSULE | Freq: Every day | ORAL | 0 refills | Status: DC

## 2018-02-22 MED ORDER — SULFAMETHOXAZOLE-TRIMETHOPRIM 800-160 MG PO TABS: 1.0000 | ORAL_TABLET | Freq: Two times a day (BID) | ORAL | 0 refills | Status: DC

## 2018-02-22 MED ORDER — CIPROFLOXACIN HCL 500 MG PO TABS: 500.0000 mg | ORAL_TABLET | Freq: Two times a day (BID) | ORAL | 0 refills | Status: DC

## 2018-02-22 NOTE — Progress Notes (Signed)
Urology progress note  Feels well this AM, no complaints, denies pain or nausea  Vitals reviewed, afebrile, hemodynamically stable Alert and oriented this morning Urine clear yellow in Foley tubing  Labs pending  675 cc urine output overnight  Assessment and plan: Robert [name] is a 84 year old male postop day #2 from cystoscopy and left ureteral stent placement for infected 5 mm left distal ureteral stone, with dilation of mild bulbourethral stricture and foley placement.  -Urine culture pending -Foley removed this AM, must void prior to discharge -Recommend 14 days total of culture appropriate antibiotics, then daily Bactrim DS until time of surgery to prevent UTI -My clinic will arrange follow-up for definitive management with left ureteroscopy, laser lithotripsy, and stent exchange in 3 to 4 weeks -Call if questions  Nickolas Madrid, MD 02/22/2018

## 2018-02-22 NOTE — Care Management Note (Signed)
Case Management Note  Patient Details  Name: Robert [name] MRN: [id number] Date of Birth: [birth year]   Patient discharged back to [place] Today.  Patient lives at home with wife.  PCP [place].  Wife drives patient to appointments.  Denies issues obtaining medications.  Patient agreeable to home health services. Patient states that he does not have a preference of agency.  [facility] unable to accept referral.  Referral made to [place] with [facility].  RNCM signing off.   Subjective/Objective:                    Action/Plan:   Expected Discharge Date:  02/22/18               Expected Discharge Plan:  [place]  In-House Referral:     Discharge planning Services  CM Consult  Post Acute Care Choice:  Home Health Choice offered to:  Patient, Spouse  DME Arranged:    DME Agency:     HH Arranged:  RN, PT, Nurse's Aide [place] Agency:  [facility]  Status of Service:  Completed, signed off  If discussed at [place] of Stay Meetings, dates discussed:    Additional Comments:  Beverly Sessions, RN 02/22/2018, 4:04 PM

## 2018-02-22 NOTE — Care Management Important Message (Signed)
Copy of signed IM left with patient in room.  

## 2018-02-22 NOTE — Discharge Instructions (Signed)

## 2018-02-22 NOTE — Care Management (Signed)
Outpatient palliative referral made to Karen with [facility]  

## 2018-02-22 NOTE — Progress Notes (Signed)
New referral for outpatient Palliative to follow at home received from [facility]. Patient to discharge home today with [facility]. Patient information faxed to referral. Flo Shanks RN, BSN, [facility] and [facility] [place], hospital Liaison [phone]

## 2018-02-23 ENCOUNTER — Telehealth: Payer: Self-pay | Admitting: Radiology

## 2018-02-23 ENCOUNTER — Other Ambulatory Visit: Payer: Self-pay | Admitting: Radiology

## 2018-02-23 DIAGNOSIS — N201 Calculus of ureter: Secondary | ICD-10-CM

## 2018-02-23 LAB — URINE CULTURE

## 2018-02-23 NOTE — Discharge Summary (Signed)
[place] [facility] [place] NAME: Robert [name]    MR#:  [id number]  DATE OF BIRTH:  1920-09-26  DATE OF ADMISSION:  02/19/2018   ADMITTING PHYSICIAN: Lance Coon, MD  DATE OF DISCHARGE: 02/22/2018 11:28 AM  PRIMARY CARE PHYSICIAN: Dion Body, MD   ADMISSION DIAGNOSIS:  Kidney stone [N20.0] Lower urinary tract infectious disease [N39.0] Renal calculi [N20.0] DISCHARGE DIAGNOSIS:  Principal Problem:   UTI (urinary tract infection) Active Problems:   HTN (hypertension)   Pure hypercholesterolemia   Acute on chronic renal failure ([place])   Kidney stone  SECONDARY DIAGNOSIS:   Past Medical History:  Diagnosis Date  . Aortic atherosclerosis ([place]) 03/01/2016  . Arthritis   . High cholesterol   . Hypertension   . TIA (transient ischemic attack)    HOSPITAL COURSE:  84 year old male admitted overnight with left-sided flank pain, weakness, possible UTI, and left 5 mm distal ureteral stone.  * Infected left 5 mm distal ureteral stone - s/p Cystoscopy, left ureteral stent placement by Dr Diamantina Providence 02/20/2018 - Urine c/s didn't grow enough bacteria. - total 10 to 14-day total course of Cipro followed by Bactrim DS until time of surgery for prophylaxis per Urology  * UTI (urinary tract infection): based on UA, Urine c/s grew mixed organisms  *Acute on CKD 2 -likely related to his kidney stone and UTI, improved with IV hydration  *HTN (hypertension) -home dose antihypertensives  * hypercholesterolemia -Home dose antilipid  *chronic diastolic CHF: Well compensated at this time DISCHARGE CONDITIONS:  stable CONSULTS OBTAINED:  Treatment Team:  Lucas Mallow, MD DRUG ALLERGIES:   Allergies  Allergen Reactions  . Morphine And Related Nausea And Vomiting  . Tramadol Nausea And Vomiting   DISCHARGE MEDICATIONS:   Allergies as of 02/22/2018      Reactions   Morphine And Related Nausea And Vomiting   Tramadol Nausea  And Vomiting      Medication List    TAKE these medications   amLODipine 5 MG tablet Commonly known as:  NORVASC Take 5 mg by mouth daily.   aspirin EC 325 MG tablet Take 81 mg by mouth daily.   atorvastatin 10 MG tablet Commonly known as:  LIPITOR Take 10 mg by mouth at bedtime.   ciprofloxacin 500 MG tablet Commonly known as:  CIPRO Take 1 tablet (500 mg total) by mouth 2 (two) times daily for 14 days. Followed by Bactrim DS for 1 month   FISH OIL PO Take 1 capsule by mouth daily.   guaiFENesin-dextromethorphan 100-10 MG/5ML syrup Commonly known as:  ROBITUSSIN DM Take 5 mLs by mouth every 4 (four) hours as needed for cough. Notes to patient:  Last dose today at 6:45am   losartan 50 MG tablet Commonly known as:  COZAAR Take 50 mg by mouth at bedtime.   sulfamethoxazole-trimethoprim 800-160 MG tablet Commonly known as:  BACTRIM DS,SEPTRA DS Take 1 tablet by mouth 2 (two) times daily. Start after done with Cipro is finished   tamsulosin 0.4 MG Caps capsule Commonly known as:  FLOMAX Take 1 capsule (0.4 mg total) by mouth daily after supper.        DISCHARGE INSTRUCTIONS:   DIET:  Regular diet DISCHARGE CONDITION:  Good ACTIVITY:  Activity as tolerated OXYGEN:  Home Oxygen: No.  Oxygen Delivery: room air DISCHARGE LOCATION:  home [facility]) with home health, PT, RN and Palliative care to follow  If you experience worsening of your admission symptoms,  develop shortness of breath, life threatening emergency, suicidal or homicidal thoughts you must seek medical attention immediately by calling 911 or calling your MD immediately  if symptoms less severe.  You Must read complete instructions/literature along with all the possible adverse reactions/side effects for all the Medicines you take and that have been prescribed to you. Take any new Medicines after you have completely understood and accpet all the possible adverse reactions/side effects.   Please  note  You were cared for by a hospitalist during your hospital stay. If you have any questions about your discharge medications or the care you received while you were in the hospital after you are discharged, you can call the unit and asked to speak with the hospitalist on call if the hospitalist that took care of you is not available. Once you are discharged, your primary care physician will handle any further medical issues. Please note that NO REFILLS for any discharge medications will be authorized once you are discharged, as it is imperative that you return to your primary care physician (or establish a relationship with a primary care physician if you do not have one) for your aftercare needs so that they can reassess your need for medications and monitor your lab values.    On the day of Discharge:  VITAL SIGNS:  Blood pressure (!) 145/98, pulse (!) 39, temperature (!) 97.5 F (36.4 C), temperature source Oral, resp. rate 16, height 6' (1.829 m), weight 95.3 kg, SpO2 99 %. PHYSICAL EXAMINATION:  GENERAL:  84 y.o.-year-old patient lying in the bed with no acute distress.  EYES: Pupils equal, round, reactive to light and accommodation. No scleral icterus. Extraocular muscles intact.  HEENT: Head atraumatic, normocephalic. Oropharynx and nasopharynx clear.  NECK:  Supple, no jugular venous distention. No thyroid enlargement, no tenderness.  LUNGS: Normal breath sounds bilaterally, no wheezing, rales,rhonchi or crepitation. No use of accessory muscles of respiration.  CARDIOVASCULAR: S1, S2 normal. No murmurs, rubs, or gallops.  ABDOMEN: Soft, non-tender, non-distended. Bowel sounds present. No organomegaly or mass.  EXTREMITIES: No pedal edema, cyanosis, or clubbing.  NEUROLOGIC: Cranial nerves II through XII are intact. Muscle strength 5/5 in all extremities. Sensation intact. Gait not checked.  PSYCHIATRIC: The patient is alert and oriented x 3.  SKIN: No obvious rash, lesion, or ulcer.    DATA REVIEW:   CBC Recent Labs  Lab 02/21/18 0306  WBC 9.0  HGB 11.7*  HCT 35.9*  PLT 131*    Chemistries  Recent Labs  Lab 02/19/18 1928  02/21/18 0306  NA 141   < > 143  K 4.1   < > 4.1  CL 112*   < > 113*  CO2 20*   < > 24  GLUCOSE 121*   < > 94  BUN 30*   < > 29*  CREATININE 1.82*   < > 1.84*  CALCIUM 9.1   < > 8.4*  AST 22  --   --   ALT 15  --   --   ALKPHOS 70  --   --   BILITOT 1.3*  --   --    < > = values in this interval not displayed.     Follow-up Information    Dion Body, MD. Go on 02/28/2018.   Specialty:  Family Medicine Why:  Wednesday November 13th at 9:45am for a follow-up  Contact information: [place] [address] [phone]        Diamantina Providence,  Herbert Seta, MD. Go on 03/22/2018.   Specialty:  Urology Why:  at 10:30am  Contact information: [place] [address] [phone]           Management plans discussed with the patient, family and they are in agreement.  CODE STATUS: Prior   TOTAL TIME TAKING CARE OF THIS PATIENT: 45 minutes.    Max Sane M.D on 02/23/2018 at 7:30 PM  Between 7am to 6pm - Pager - [phone]  After 6pm go to [email or web address] - Proofreader  [facility]  Office  [phone]  CC: Primary care physician; Dion Body, MD   Note: This dictation was prepared with Dragon dictation along with smaller phrase technology. Any transcriptional errors that result from this process are unintentional.

## 2018-02-23 NOTE — Telephone Encounter (Addendum)
Spoke to patient who asked wife to discuss upcoming surgery with Dr Diamantina Providence. Notified wife of ureteroscopy, laser lithotripsy & stent exchange scheduled on 03/23/2018 & that pre-admission testing will call to make an appointment at their office prior to surgery. Advised wife to call for arrival time to same day surgery on 03/22/2018. Patient may continue aspirin per Dr Diamantina Providence. Questions answered. Wife expresses understanding.

## 2018-03-02 ENCOUNTER — Telehealth: Payer: Self-pay

## 2018-03-02 NOTE — Telephone Encounter (Signed)
Flagged on EMMI report for having other questions or problems.  Called and spoke with patient who mentioned he has no questions or concerns at this time.  I thanked him for his time and for participating in the callback process.

## 2018-03-06 ENCOUNTER — Other Ambulatory Visit: Payer: Self-pay | Admitting: Radiology

## 2018-03-06 DIAGNOSIS — R300 Dysuria: Secondary | ICD-10-CM

## 2018-03-06 DIAGNOSIS — N201 Calculus of ureter: Secondary | ICD-10-CM

## 2018-03-06 DIAGNOSIS — R350 Frequency of micturition: Secondary | ICD-10-CM

## 2018-03-07 ENCOUNTER — Ambulatory Visit (INDEPENDENT_AMBULATORY_CARE_PROVIDER_SITE_OTHER): Payer: Medicare Other

## 2018-03-07 DIAGNOSIS — N201 Calculus of ureter: Secondary | ICD-10-CM

## 2018-03-07 DIAGNOSIS — R300 Dysuria: Secondary | ICD-10-CM | POA: Diagnosis not present

## 2018-03-07 DIAGNOSIS — R350 Frequency of micturition: Secondary | ICD-10-CM

## 2018-03-07 MED ORDER — NYSTATIN 100000 UNIT/GM EX POWD: Freq: Four times a day (QID) | CUTANEOUS | 0 refills | Status: DC | PRN

## 2018-03-07 NOTE — Progress Notes (Signed)
In and Out Catheterization  Patient is present today for a I & O catheterization due to pre-op culture needed. Patient was cleaned and prepped in a sterile fashion with betadine and Lidocaine 2% jelly was instilled into the urethra.  A 14 Coude FR cath was inserted no complications were noted , 14ml ml of urine return was noted, urine was orange in color. A clean urine sample was collected for UA and culture. Bladder was drained  And catheter was removed with out difficulty.    Preformed by: Fonnie Jarvis, CMA  Follow up/ Additional notes: It was noted that patient had swollen glans and redness and yeast on scrotum as well. Per Dr. Erlene Quan ok to send in nystatin powder for patient to use PRN

## 2018-03-08 ENCOUNTER — Telehealth: Payer: Self-pay

## 2018-03-08 LAB — MICROSCOPIC EXAMINATION
EPITHELIAL CELLS (NON RENAL): NONE SEEN /HPF (ref 0–10)
RBC, UA: >30 /hpf — ABNORMAL HIGH (ref 0–2)

## 2018-03-08 LAB — URINALYSIS, COMPLETE
Bilirubin, UA: NEGATIVE
Glucose, UA: NEGATIVE
Ketones, UA: NEGATIVE
Nitrite, UA: NEGATIVE
PH UA: 5.5 (ref 5.0–7.5)
Specific Gravity, UA: >1.03 — ABNORMAL HIGH (ref 1.005–1.030)
Urobilinogen, Ur: 0.2 mg/dL (ref 0.2–1.0)

## 2018-03-08 NOTE — Telephone Encounter (Signed)
-----   Message from Billey Co, MD sent at 03/08/2018  9:33 AM EST ----- Urine not convincing for infection, will follow up culture and give abx if ultimately positive. His urgency and frequency are likely from the stent. Continue flomax. Avoid anticholinergics with his advanced age and high fall risk.  Nickolas Madrid, MD 03/08/2018

## 2018-03-11 LAB — CULTURE, URINE COMPREHENSIVE

## 2018-03-12 ENCOUNTER — Other Ambulatory Visit: Payer: Self-pay

## 2018-03-12 ENCOUNTER — Encounter
Admission: RE | Admit: 2018-03-12 | Discharge: 2018-03-12 | Disposition: A | Discharge status: Home / Self Care | Payer: Medicare Other | Source: Ambulatory Visit | Attending: Urology | Admitting: Urology

## 2018-03-12 HISTORY — DX: Personal history of urinary calculi: Z87.442

## 2018-03-12 HISTORY — DX: Dengue fever (classical dengue): A90

## 2018-03-12 HISTORY — DX: Typhus fever, unspecified: A75.9

## 2018-03-12 NOTE — Patient Instructions (Addendum)
Your procedure is scheduled on: Friday 03/23/18 Report to [place]. To find out your arrival time please call [phone] between 1PM - 3PM on Thursday 03/22/18.  Remember: Instructions that are not followed completely may result in serious medical risk, up to and including death, or upon the discretion of your surgeon and anesthesiologist your surgery may need to be rescheduled.     _X__ 1. Do not eat food after midnight the night before your procedure.                 No gum chewing or hard candies. You may drink clear liquids up to 2 hours                 before you are scheduled to arrive for your surgery- DO not drink clear                 liquids within 2 hours of the start of your surgery.                 Clear Liquids include:  water, apple juice without pulp, clear carbohydrate                 drink such as Clearfast or Gatorade, Black Coffee or Tea (Do not add                 anything to coffee or tea).  __X__2.  On the morning of surgery brush your teeth with toothpaste and water, you                 may rinse your mouth with mouthwash if you wish.  Do not swallow any              toothpaste of mouthwash.     _X__ 3.  No Alcohol for 24 hours before or after surgery.   _X__ 4.  Do Not Smoke or use e-cigarettes For 24 Hours Prior to Your Surgery.                 Do not use any chewable tobacco products for at least 6 hours prior to                 surgery.  ____  5.  Bring all medications with you on the day of surgery if instructed.   __X__  6.  Notify your doctor if there is any change in your medical condition      (cold, fever, infections).     Do not wear jewelry, make-up, hairpins, clips or nail polish. Do not wear lotions, powders, or perfumes.  Do not shave 48 hours prior to surgery. Men may shave face and neck. Do not bring valuables to the hospital.    [facility] is not responsible for any belongings or  valuables.  Contacts, dentures/partials or body piercings may not be worn into surgery. Bring a case for your contacts, glasses or hearing aids, a denture cup will be supplied. Leave your suitcase in the car. After surgery it may be brought to your room. For patients admitted to the hospital, discharge time is determined by your treatment team.   Patients discharged the day of surgery will not be allowed to drive home.   Please read over the following fact sheets that you were given:   MRSA Information  __X__ Take these medicines the morning of surgery with A SIP OF WATER:  1. amLODipine (NORVASC)  2.   3.   4.  5.  6.  ____ Fleet Enema (as directed)   __ __ Use CHG Soap/SAGE wipes as directed  ____ Use inhalers on the day of surgery  ____ Stop metformin/Janumet/Farxiga 2 days prior to surgery    ____ Take 1/2 of usual insulin dose the night before surgery. No insulin the morning          of surgery.   ____ Stop Blood Thinners Coumadin/Plavix/Xarelto/Pleta/Pradaxa/Eliquis/Effient/Aspirin  on   Or contact your Surgeon, Cardiologist or Medical Doctor regarding  ability to stop your blood thinners   __X__ Stop Anti-inflammatories 7 days before surgery such as Advil, Ibuprofen, Motrin,  BC or Goodies Powder, Naprosyn, Naproxen, Aleve, Aspirin    __X__ Stopall herbal supplements, fish oil or vitamin E until after surgery.    ____ Bring C-Pap to the hospital.   YOU MAY CONTINUE ASPIRIN PER OFFICE NOTE, DO NOT TAKE THE DAY OF PROCEDURE  Spoke to patient who asked wife to discuss upcoming surgery with Dr Diamantina Providence. Notified wife of ureteroscopy, laser lithotripsy & stent exchange scheduled on 03/23/2018 & that pre-admission testing will call to make an appointment at their office prior to surgery. Advised wife to call for arrival time to same day surgery on 03/22/2018. Patient may continue aspirin per Dr Diamantina Providence. Questions answered. Wife expresses understanding

## 2018-03-19 ENCOUNTER — Other Ambulatory Visit: Payer: Self-pay | Admitting: Urology

## 2018-03-19 MED ORDER — NYSTATIN 100000 UNIT/GM EX POWD: Freq: Four times a day (QID) | CUTANEOUS | 0 refills | Status: DC | PRN

## 2018-03-22 ENCOUNTER — Ambulatory Visit: Payer: Medicare Other | Admitting: Urology

## 2018-03-22 MED ORDER — CIPROFLOXACIN IN D5W 400 MG/200ML IV SOLN
400.0000 mg | INTRAVENOUS | Status: AC
  Administered 2018-03-23: 400 mg via INTRAVENOUS

## 2018-03-23 ENCOUNTER — Other Ambulatory Visit: Payer: Self-pay

## 2018-03-23 ENCOUNTER — Ambulatory Visit: Payer: Medicare Other

## 2018-03-23 ENCOUNTER — Encounter
Admission: RE | Disposition: A | Discharge status: Home / Self Care | Payer: Self-pay | Source: Ambulatory Visit | Attending: Urology

## 2018-03-23 ENCOUNTER — Ambulatory Visit
Admission: RE | Admit: 2018-03-23 | Discharge: 2018-03-23 | Disposition: A | Discharge status: Home / Self Care | Payer: Medicare Other | Source: Ambulatory Visit | Attending: Urology | Admitting: Urology

## 2018-03-23 DIAGNOSIS — Z96643 Presence of artificial hip joint, bilateral: Secondary | ICD-10-CM | POA: Diagnosis not present

## 2018-03-23 DIAGNOSIS — N35912 Unspecified bulbous urethral stricture, male: Secondary | ICD-10-CM | POA: Insufficient documentation

## 2018-03-23 DIAGNOSIS — Z87442 Personal history of urinary calculi: Secondary | ICD-10-CM | POA: Insufficient documentation

## 2018-03-23 DIAGNOSIS — N201 Calculus of ureter: Secondary | ICD-10-CM

## 2018-03-23 DIAGNOSIS — E7800 Pure hypercholesterolemia, unspecified: Secondary | ICD-10-CM | POA: Diagnosis not present

## 2018-03-23 DIAGNOSIS — M1990 Unspecified osteoarthritis, unspecified site: Secondary | ICD-10-CM | POA: Diagnosis not present

## 2018-03-23 DIAGNOSIS — Z7982 Long term (current) use of aspirin: Secondary | ICD-10-CM | POA: Diagnosis not present

## 2018-03-23 DIAGNOSIS — I10 Essential (primary) hypertension: Secondary | ICD-10-CM | POA: Diagnosis not present

## 2018-03-23 DIAGNOSIS — Z8673 Personal history of transient ischemic attack (TIA), and cerebral infarction without residual deficits: Secondary | ICD-10-CM | POA: Diagnosis not present

## 2018-03-23 DIAGNOSIS — Z87891 Personal history of nicotine dependence: Secondary | ICD-10-CM | POA: Diagnosis not present

## 2018-03-23 DIAGNOSIS — Z885 Allergy status to narcotic agent status: Secondary | ICD-10-CM | POA: Diagnosis not present

## 2018-03-23 DIAGNOSIS — Z79899 Other long term (current) drug therapy: Secondary | ICD-10-CM | POA: Diagnosis not present

## 2018-03-23 HISTORY — PX: CYSTOSCOPY/URETEROSCOPY/HOLMIUM LASER/STENT PLACEMENT: SHX6546

## 2018-03-23 HISTORY — PX: CYSTOSCOPY W/ RETROGRADES: SHX1426

## 2018-03-23 SURGERY — CYSTOSCOPY/URETEROSCOPY/HOLMIUM LASER/STENT PLACEMENT
Anesthesia: General | Site: Ureter | Laterality: Left

## 2018-03-23 MED ORDER — HYDROCODONE-ACETAMINOPHEN 5-325 MG PO TABS: 1.0000 | ORAL_TABLET | Freq: Four times a day (QID) | ORAL | 0 refills | Status: DC | PRN

## 2018-03-23 MED ORDER — SUCCINYLCHOLINE CHLORIDE 20 MG/ML IJ SOLN
INTRAMUSCULAR | Status: DC | PRN
  Administered 2018-03-23: 100 mg via INTRAVENOUS

## 2018-03-23 MED ORDER — LACTATED RINGERS IV SOLN
INTRAVENOUS | Status: DC
  Administered 2018-03-23: 1000 mL via INTRAVENOUS

## 2018-03-23 MED ORDER — FENTANYL CITRATE (PF) 100 MCG/2ML IJ SOLN
INTRAMUSCULAR | Status: AC
  Filled 2018-03-23: qty 2

## 2018-03-23 MED ORDER — CIPROFLOXACIN IN D5W 400 MG/200ML IV SOLN
INTRAVENOUS | Status: AC
  Filled 2018-03-23: qty 200

## 2018-03-23 MED ORDER — PHENYLEPHRINE HCL 10 MG/ML IJ SOLN
INTRAMUSCULAR | Status: AC
  Filled 2018-03-23: qty 1

## 2018-03-23 MED ORDER — SUGAMMADEX SODIUM 200 MG/2ML IV SOLN
INTRAVENOUS | Status: AC
  Filled 2018-03-23: qty 2

## 2018-03-23 MED ORDER — ROCURONIUM BROMIDE 100 MG/10ML IV SOLN
INTRAVENOUS | Status: DC | PRN
  Administered 2018-03-23: 20 mg via INTRAVENOUS

## 2018-03-23 MED ORDER — SUCCINYLCHOLINE CHLORIDE 20 MG/ML IJ SOLN
INTRAMUSCULAR | Status: AC
  Filled 2018-03-23: qty 1

## 2018-03-23 MED ORDER — FAMOTIDINE 20 MG PO TABS
20.0000 mg | ORAL_TABLET | Freq: Once | ORAL | Status: AC
  Administered 2018-03-23: 20 mg via ORAL

## 2018-03-23 MED ORDER — LIDOCAINE HCL (CARDIAC) PF 100 MG/5ML IV SOSY
PREFILLED_SYRINGE | INTRAVENOUS | Status: DC | PRN
  Administered 2018-03-23: 80 mg via INTRAVENOUS

## 2018-03-23 MED ORDER — ROCURONIUM BROMIDE 50 MG/5ML IV SOLN
INTRAVENOUS | Status: AC
  Filled 2018-03-23: qty 1

## 2018-03-23 MED ORDER — ONDANSETRON HCL 4 MG/2ML IJ SOLN: 4.0000 mg | Freq: Once | INTRAMUSCULAR | Status: DC | PRN

## 2018-03-23 MED ORDER — PHENYLEPHRINE HCL 10 MG/ML IJ SOLN
INTRAMUSCULAR | Status: DC | PRN
  Administered 2018-03-23 (×4): 100 ug via INTRAVENOUS

## 2018-03-23 MED ORDER — PROPOFOL 10 MG/ML IV BOLUS
INTRAVENOUS | Status: AC
  Filled 2018-03-23: qty 20

## 2018-03-23 MED ORDER — LIDOCAINE HCL (PF) 2 % IJ SOLN
INTRAMUSCULAR | Status: AC
  Filled 2018-03-23: qty 10

## 2018-03-23 MED ORDER — FAMOTIDINE 20 MG PO TABS
ORAL_TABLET | ORAL | Status: AC
  Filled 2018-03-23: qty 1

## 2018-03-23 MED ORDER — ONDANSETRON HCL 4 MG/2ML IJ SOLN
INTRAMUSCULAR | Status: AC
  Filled 2018-03-23: qty 2

## 2018-03-23 MED ORDER — EPHEDRINE SULFATE 50 MG/ML IJ SOLN
INTRAMUSCULAR | Status: DC | PRN
  Administered 2018-03-23: 5 mg via INTRAVENOUS
  Administered 2018-03-23: 15 mg via INTRAVENOUS
  Administered 2018-03-23: 5 mg via INTRAVENOUS
  Administered 2018-03-23: 10 mg via INTRAVENOUS

## 2018-03-23 MED ORDER — SUGAMMADEX SODIUM 200 MG/2ML IV SOLN
INTRAVENOUS | Status: DC | PRN
  Administered 2018-03-23: 200 mg via INTRAVENOUS

## 2018-03-23 MED ORDER — FENTANYL CITRATE (PF) 100 MCG/2ML IJ SOLN
INTRAMUSCULAR | Status: DC | PRN
  Administered 2018-03-23: 50 ug via INTRAVENOUS

## 2018-03-23 MED ORDER — ONDANSETRON HCL 4 MG/2ML IJ SOLN
INTRAMUSCULAR | Status: DC | PRN
  Administered 2018-03-23: 4 mg via INTRAVENOUS

## 2018-03-23 MED ORDER — PROPOFOL 10 MG/ML IV BOLUS
INTRAVENOUS | Status: DC | PRN
  Administered 2018-03-23: 100 mg via INTRAVENOUS

## 2018-03-23 MED ORDER — FENTANYL CITRATE (PF) 100 MCG/2ML IJ SOLN: 25.0000 ug | INTRAMUSCULAR | Status: DC | PRN

## 2018-03-23 MED ORDER — IOPAMIDOL (ISOVUE-M 200) INJECTION 41%
INTRAMUSCULAR | Status: DC | PRN
  Administered 2018-03-23: 15 mL

## 2018-03-23 MED ORDER — SULFAMETHOXAZOLE-TRIMETHOPRIM 800-160 MG PO TABS: 1.0000 | ORAL_TABLET | Freq: Every day | ORAL | 0 refills | Status: DC

## 2018-03-23 SURGICAL SUPPLY — 34 items
BAG DRAIN CYSTO-URO LG1000N (MISCELLANEOUS) ×3 IMPLANT
BRUSH SCRUB EZ 1% IODOPHOR (MISCELLANEOUS) ×3 IMPLANT
BULB IRRIG PATHFIND (MISCELLANEOUS) IMPLANT
CATH URETL 5X70 OPEN END (CATHETERS) ×2 IMPLANT
CNTNR SPEC 2.5X3XGRAD LEK (MISCELLANEOUS)
CONT SPEC 4OZ STER OR WHT (MISCELLANEOUS)
CONTAINER SPEC 2.5X3XGRAD LEK (MISCELLANEOUS) IMPLANT
DRAPE UTILITY 15X26 TOWEL STRL (DRAPES) ×3 IMPLANT
FIBER LASER LITHO 273 (Laser) ×2 IMPLANT
GLOVE BIOGEL PI IND STRL 7.5 (GLOVE) ×1 IMPLANT
GLOVE BIOGEL PI INDICATOR 7.5 (GLOVE) ×2
GOWN STRL REUS W/ TWL LRG LVL3 (GOWN DISPOSABLE) ×1 IMPLANT
GOWN STRL REUS W/ TWL XL LVL3 (GOWN DISPOSABLE) ×1 IMPLANT
GOWN STRL REUS W/TWL LRG LVL3 (GOWN DISPOSABLE) ×2
GOWN STRL REUS W/TWL XL LVL3 (GOWN DISPOSABLE) ×2
GUIDEWIRE INTRO SET STRAIGHT (WIRE) ×2 IMPLANT
INFUSOR MANOMETER BAG 3000ML (MISCELLANEOUS) ×3 IMPLANT
INTRODUCER DILATOR DOUBLE (INTRODUCER) IMPLANT
KIT TURNOVER CYSTO (KITS) ×3 IMPLANT
PACK CYSTO AR (MISCELLANEOUS) ×3 IMPLANT
SENSORWIRE 0.038 NOT ANGLED (WIRE) ×3
SET CYSTO W/LG BORE CLAMP LF (SET/KITS/TRAYS/PACK) ×3 IMPLANT
SHEATH URETERAL 12FRX35CM (MISCELLANEOUS) IMPLANT
SOL .9 NS 3000ML IRR  AL (IV SOLUTION) ×2
SOL .9 NS 3000ML IRR UROMATIC (IV SOLUTION) ×1 IMPLANT
STENT URET 6FRX24 CONTOUR (STENTS) IMPLANT
STENT URET 6FRX26 CONTOUR (STENTS) IMPLANT
STENT URET 6FRX28 CONTOUR (STENTS) ×2 IMPLANT
SURGILUBE 2OZ TUBE FLIPTOP (MISCELLANEOUS) ×3 IMPLANT
SYR 10ML LL (SYRINGE) ×3 IMPLANT
TUBING ART PRESS 48 MALE/FEM (TUBING) IMPLANT
VALVE UROSEAL ADJ ENDO (VALVE) ×2 IMPLANT
WATER STERILE IRR 1000ML POUR (IV SOLUTION) ×3 IMPLANT
WIRE SENSOR 0.038 NOT ANGLED (WIRE) ×1 IMPLANT

## 2018-03-23 NOTE — Anesthesia Post-op Follow-up Note (Signed)
Anesthesia QCDR form completed.        

## 2018-03-23 NOTE — Anesthesia Preprocedure Evaluation (Signed)
Anesthesia Evaluation  Patient identified by MRN, date of birth, ID band Patient awake    Reviewed: Allergy & Precautions, NPO status , Patient's Chart, lab work & pertinent test results  History of Anesthesia Complications Negative for: history of anesthetic complications  Airway Mallampati: III       Dental  (+) Dental Advidsory Given   Pulmonary neg sleep apnea, neg COPD, former smoker,           Cardiovascular hypertension, Pt. 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   Reproductive/Obstetrics                             Anesthesia Physical  Anesthesia Plan  ASA: III  Anesthesia Plan: General   Post-op Pain Management:    Induction: Intravenous  PONV Risk Score and Plan: 2 and Ondansetron, Dexamethasone and Treatment may vary due to age or medical condition  Airway Management Planned: Oral ETT  Additional Equipment:   Intra-op Plan:   Post-operative Plan: Extubation in OR  Informed Consent: I have reviewed the patients History and Physical, chart, labs and discussed the procedure including the risks, benefits and alternatives for the proposed anesthesia with the patient or authorized representative who has indicated his/her understanding and acceptance.     Plan  Discussed with:   Anesthesia Plan Comments:         Anesthesia Quick Evaluation

## 2018-03-23 NOTE — Discharge Instructions (Signed)
  AMBULATORY SURGERY  DISCHARGE INSTRUCTIONS   1) The drugs that you were given will stay in your system until tomorrow so for the next 24 hours you should not:  A) Drive an automobile B) Make any legal decisions C) Drink any alcoholic beverage   2) You may resume regular meals tomorrow.  Today it is better to start with liquids and gradually work up to solid foods.  You may eat anything you prefer, but it is better to start with liquids, then soup and crackers, and gradually work up to solid foods.   3) Please notify your doctor immediately if you have any unusual bleeding, trouble breathing, redness and pain at the surgery site, drainage, fever, or pain not relieved by medication.    4) Additional Instructions: TAKE A STOOL SOFTENER TWICE A DAY WHILE TAKING NARCOTIC PAIN MEDICINE TO PREVENT CONSTIPATION   Please contact your physician with any problems or Same Day Surgery at [phone], Monday through Friday 6 am to 4 pm, or [facility] [place] number at [phone].   

## 2018-03-23 NOTE — Anesthesia Postprocedure Evaluation (Signed)
Anesthesia Post Note  Patient: Robert [name]  Procedure(s) Performed: CYSTOSCOPY/URETEROSCOPY/HOLMIUM LASER/STENT Exchange (Left Ureter) CYSTOSCOPY WITH RETROGRADE PYELOGRAM (Left Ureter)  Patient location during evaluation: PACU Anesthesia Type: General Level of consciousness: awake and alert Pain management: pain level controlled Vital Signs Assessment: post-procedure vital signs reviewed and stable Respiratory status: spontaneous breathing, nonlabored ventilation, respiratory function stable and patient connected to nasal cannula oxygen Cardiovascular status: blood pressure returned to baseline and stable Postop Assessment: no apparent nausea or vomiting Anesthetic complications: no     Last Vitals:  Vitals:   03/23/18 0929 03/23/18 1020  BP: (!) 124/59 (!) 104/52  Pulse: 76 77  Resp: 18   Temp: (!) 35.8 C   SpO2: 95% 94%    Last Pain:  Vitals:   03/23/18 0929  TempSrc: Temporal  PainSc: 0-No pain                 Martha Clan

## 2018-03-23 NOTE — H&P (Signed)
   7:31 AM   Robert [name] 05/11/1920 [id number]  HPI: Robert [name] is a 84 year old man who presents today for definitive management of his left ureteral stone.  He presented 02/19/2017 with grossly infected urine and a 5 mm left distal ureteral stone with flank pain.  He underwent stent placement and antibiotics at that time.  Most recent urine culture on 11/20 showed no growth.  He denies any fevers, chills, chest pain, or shortness of breath.   PMH: Past Medical History:  Diagnosis Date  . Aortic atherosclerosis ([place]) 03/01/2016  . Arthritis   . 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 High cholesterol   . History of kidney stones   . Hypertension   . TIA (transient ischemic attack)   . 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 CYSTOSCOPY WITH STENT PLACEMENT Left 02/20/2018   Procedure: CYSTOSCOPY WITH STENT PLACEMENT;  Surgeon: Billey Co, MD;  Location: [facility] ORS;  Service: Urology;  Laterality: Left;  . LEG SURGERY     broken tib/fib  . TOTAL HIP ARTHROPLASTY Bilateral     Allergies:  Allergies  Allergen Reactions  . Morphine And Related Nausea And Vomiting  . Tramadol Nausea And Vomiting    Family History: Family History  Problem Relation Age of Onset  . Prostate cancer Neg Hx   . Bladder Cancer Neg Hx   . Kidney cancer Neg Hx     Social History:  reports that he has quit smoking. His smoking use included pipe. He has never used smokeless tobacco. He reports that he drinks about 7.0 standard drinks of alcohol per week. He reports that he does not use drugs.  ROS: Please see flowsheet from today's date for complete review of systems.  Physical Exam: BP 114/73   Pulse 64   Temp 97.7 F (36.5 C) (Tympanic)   Resp 18   Ht 5\' 10"  (1.778 m)   Wt 90.7 kg   SpO2 96%   BMI 28.70 kg/m    Constitutional:  Alert and oriented, No acute distress. Cardiovascular: Regular rate and  rhythm Respiratory: Clear to auscultation bilaterally GI: Abdomen is soft, nontender, nondistended, no abdominal masses GU: No CVA tenderness Lymph: No cervical or inguinal lymphadenopathy. Skin: No rashes, bruises or suspicious lesions. Neurologic: Grossly intact, no focal deficits, moving all 4 extremities. Psychiatric: Normal mood and affect.  Laboratory Data: Urine culture 11/20 no growth   Assessment & Plan:   Robert [name] is a 84 year old male here for definitive left ureteroscopy for a 5 mm left distal ureteral stone as well as a left 8 mm renal stone.  He was pre-stented for urinary tract infection on 03/07/2018.  Most recent urine culture 11/20 shows no growth.  We discussed the risks and benefits at length of left ureteroscopy, laser lithotripsy, and stent exchange.  We specifically discussed the risks of bleeding, infection, need for additional procedures, stent related symptoms.    Billey Co, [facility] [address] [place], [place] [address] [phone]

## 2018-03-23 NOTE — Anesthesia Procedure Notes (Signed)
Procedure Name: Intubation Date/Time: 03/23/2018 7:52 AM Performed by: Jacqualine Mau, RN Pre-anesthesia Checklist: Patient identified, Emergency Drugs available, Suction available, Patient being monitored and Timeout performed Patient Re-evaluated:Patient Re-evaluated prior to induction Oxygen Delivery Method: Circle system utilized Preoxygenation: Pre-oxygenation with 100% oxygen Induction Type: IV induction Ventilation: Mask ventilation without difficulty Laryngoscope Size: McGraph and 3 Grade View: Grade I Tube type: Oral Tube size: 7.5 mm Number of attempts: 1 Airway Equipment and Method: Stylet and Video-laryngoscopy Placement Confirmation: ETT inserted through vocal cords under direct vision,  positive ETCO2 and breath sounds checked- equal and bilateral Secured at: 21 cm Tube secured with: Tape Dental Injury: Teeth and Oropharynx as per pre-operative assessment

## 2018-03-23 NOTE — Op Note (Signed)
Date of procedure: 03/23/18  Preoperative diagnosis:  1. Left 20m distal ureteral stone, pre-stented  Postoperative diagnosis:  1. Same  Procedure: 1. Cystoscopy, left ureteroscopy, laser lithotripsy, left retrograde pyelogram, left ureteral stent exchange  Surgeon: BNickolas Madrid MD  Anesthesia: General  Complications: None  Intraoperative findings:  1.  Subtle narrowing at the bulbar urethra, able to bypass with rigid 41F scope 2.  Uncomplicated lithotripsy with semirigid ureteroscope of left distal ureteral stone 3.  Unable to pass flexible ureteroscope despite dilation with 8/10 F dilators 4.  Uncomplicated left ureteral stent placement  EBL: None  Specimens: None  Drains: Left 62F X 28 cm stent  Indication: Robert LOWis a 84y.o. patient that previously presented with urinary tract infection and a left distal ureteral stone and underwent urgent left ureteral stent placement.  He presents today for definitive management with left ureteroscopy laser lithotripsy and stent exchange..  After reviewing the management options for treatment, they elected to proceed with the above surgical procedure(s). We have discussed the potential benefits and risks of the procedure, side effects of the proposed treatment, the likelihood of the patient achieving the goals of the procedure, and any potential problems that might occur during the procedure or recuperation. Informed consent has been obtained.  Description of procedure:  The patient was taken to the operating room and general anesthesia was induced.  The patient was placed in the dorsal lithotomy position, prepped and draped in the usual sterile fashion, and preoperative antibiotics(Cipro) were administered. A preoperative time-out was performed.   A 21 French rigid cystoscope was used to intubate the urethra.  There was again a subtle narrowing at the bulbar urethra but the 21 French rigid cystoscope was able to pass easily into the  bladder.  The left ureteral stent was grasped and pulled to the meatus and a sensor wire was advanced up to the collecting system.  The old stent was removed.  A 4 French semirigid ureteroscope was advanced alongside the wire into the distal ureter and a yellow soft stone was visualized.  This was dusted using the 270 m laser fiber on settings of 0.5 J and 20 Hz.  All fragments were irrigated free from the ureter.  Pullback ureteroscopy demonstrated no residual fragments.  We then attempted to pass the digital flexible ureteroscope over the wire but met resistance at the ureteral orifice.  8/10 F dilators were used to dilate the left distal and mid ureter.  We attempted to pass the digital flexible ureteroscope again, but again met resistance at the distal ureter and there was significant buckling.  At this point with his advanced age and small lower pole stone burden I elected to defer treatment of his left renal stones.  5 [place] catheter was advanced over the sensor wire and a retrograde pyelogram was performed to aid in stent placement.  The wire was then replaced, and a 6 [place] 28 cm stent was uneventfully placed under direct vision with an excellent curl noted in the upper pole under fluoroscopy as well as under direct vision in the bladder.  The bladder was drained and cystourethroscopy demonstrated no significant trauma in the bulbar urethra.  Disposition: Stable to PACU  Plan: Must void prior to discharge Continue Bactrim DS daily for stent prophylaxis Follow-up in 1 week for stent removal  BNickolas Madrid MD

## 2018-03-23 NOTE — Transfer of Care (Signed)
Immediate Anesthesia Transfer of Care Note  Patient: Robert [name]  Procedure(s) Performed: CYSTOSCOPY/URETEROSCOPY/HOLMIUM LASER/STENT Exchange (Left Ureter) CYSTOSCOPY WITH RETROGRADE PYELOGRAM (Left Ureter)  Patient Location: PACU  Anesthesia Type:General  Level of Consciousness: awake  Airway & Oxygen Therapy: Patient Spontanous Breathing and Patient connected to face mask oxygen  Post-op Assessment: Report given to RN and Post -op Vital signs reviewed and stable  Post vital signs: Reviewed and stable  Last Vitals:  Vitals Value Taken Time  BP 105/57 03/23/2018  8:36 AM  Temp 36.7 C 03/23/2018  8:35 AM  Pulse 78 03/23/2018  8:39 AM  Resp 19 03/23/2018  8:39 AM  SpO2 99 % 03/23/2018  8:39 AM  Vitals shown include unvalidated device data.  Last Pain:  Vitals:   03/23/18 0625  TempSrc: Tympanic  PainSc: 0-No pain         Complications: No apparent anesthesia complications

## 2018-03-24 ENCOUNTER — Encounter: Payer: Self-pay | Admitting: Urology

## 2018-03-27 ENCOUNTER — Emergency Department: Payer: Medicare Other

## 2018-03-27 ENCOUNTER — Other Ambulatory Visit: Payer: Self-pay

## 2018-03-27 ENCOUNTER — Inpatient Hospital Stay
Admission: EM | Admit: 2018-03-27 | Discharge: 2018-03-30 | DRG: 683 | Disposition: A | Discharge status: Home Health | Payer: Medicare Other | Attending: Internal Medicine | Admitting: Internal Medicine

## 2018-03-27 ENCOUNTER — Encounter: Payer: Self-pay | Admitting: Emergency Medicine

## 2018-03-27 DIAGNOSIS — E785 Hyperlipidemia, unspecified: Secondary | ICD-10-CM | POA: Diagnosis present

## 2018-03-27 DIAGNOSIS — M1990 Unspecified osteoarthritis, unspecified site: Secondary | ICD-10-CM | POA: Diagnosis present

## 2018-03-27 DIAGNOSIS — Z87442 Personal history of urinary calculi: Secondary | ICD-10-CM | POA: Diagnosis not present

## 2018-03-27 DIAGNOSIS — Z7982 Long term (current) use of aspirin: Secondary | ICD-10-CM

## 2018-03-27 DIAGNOSIS — E875 Hyperkalemia: Secondary | ICD-10-CM

## 2018-03-27 DIAGNOSIS — I10 Essential (primary) hypertension: Secondary | ICD-10-CM | POA: Diagnosis present

## 2018-03-27 DIAGNOSIS — Z885 Allergy status to narcotic agent status: Secondary | ICD-10-CM

## 2018-03-27 DIAGNOSIS — R319 Hematuria, unspecified: Secondary | ICD-10-CM

## 2018-03-27 DIAGNOSIS — Z79899 Other long term (current) drug therapy: Secondary | ICD-10-CM

## 2018-03-27 DIAGNOSIS — N179 Acute kidney failure, unspecified: Secondary | ICD-10-CM | POA: Diagnosis present

## 2018-03-27 DIAGNOSIS — I700 Atherosclerosis of aorta: Secondary | ICD-10-CM | POA: Diagnosis present

## 2018-03-27 DIAGNOSIS — N390 Urinary tract infection, site not specified: Secondary | ICD-10-CM | POA: Diagnosis present

## 2018-03-27 DIAGNOSIS — Z8673 Personal history of transient ischemic attack (TIA), and cerebral infarction without residual deficits: Secondary | ICD-10-CM

## 2018-03-27 DIAGNOSIS — Z87891 Personal history of nicotine dependence: Secondary | ICD-10-CM | POA: Diagnosis not present

## 2018-03-27 LAB — BASIC METABOLIC PANEL
Anion gap: 6 (ref 5–15)
Anion gap: 5 (ref 5–15)
BUN: 39 mg/dL — ABNORMAL HIGH (ref 8–23)
BUN: 39 mg/dL — ABNORMAL HIGH (ref 8–23)
CHLORIDE: 114 mmol/L — AB (ref 98–111)
CO2: 20 mmol/L — ABNORMAL LOW (ref 22–32)
CO2: 19 mmol/L — ABNORMAL LOW (ref 22–32)
Calcium: 8.8 mg/dL — ABNORMAL LOW (ref 8.9–10.3)
Calcium: 9 mg/dL (ref 8.9–10.3)
Chloride: 114 mmol/L — ABNORMAL HIGH (ref 98–111)
Creatinine, Ser: 2.28 mg/dL — ABNORMAL HIGH (ref 0.61–1.24)
Creatinine, Ser: 2.05 mg/dL — ABNORMAL HIGH (ref 0.61–1.24)
GFR calc Af Amer: 27 mL/min — ABNORMAL LOW (ref >60)
GFR calc Af Amer: 31 mL/min — ABNORMAL LOW (ref >60)
GFR calc non Af Amer: 27 mL/min — ABNORMAL LOW (ref >60)
GFR, EST NON AFRICAN AMERICAN: 24 mL/min — AB (ref >60)
Glucose, Bld: 109 mg/dL — ABNORMAL HIGH (ref 70–99)
Glucose, Bld: 93 mg/dL (ref 70–99)
Potassium: 5.8 mmol/L — ABNORMAL HIGH (ref 3.5–5.1)
Potassium: 6.2 mmol/L — ABNORMAL HIGH (ref 3.5–5.1)
Sodium: 140 mmol/L (ref 135–145)
Sodium: 138 mmol/L (ref 135–145)

## 2018-03-27 LAB — URINALYSIS, COMPLETE (UACMP) WITH MICROSCOPIC
Bacteria, UA: NONE SEEN
Bilirubin Urine: NEGATIVE
Glucose, UA: NEGATIVE mg/dL
Ketones, ur: NEGATIVE mg/dL
NITRITE: NEGATIVE
Protein, ur: 30 mg/dL — AB
RBC / HPF: >50 RBC/hpf — ABNORMAL HIGH (ref 0–5)
Specific Gravity, Urine: 1.016 (ref 1.005–1.030)
Squamous Epithelial / HPF: NONE SEEN (ref 0–5)
WBC, UA: >50 WBC/hpf — ABNORMAL HIGH (ref 0–5)
pH: 5 (ref 5.0–8.0)

## 2018-03-27 LAB — CBC
HCT: 40.1 % (ref 39.0–52.0)
Hemoglobin: 13 g/dL (ref 13.0–17.0)
MCH: 31.9 pg (ref 26.0–34.0)
MCHC: 32.4 g/dL (ref 30.0–36.0)
MCV: 98.5 fL (ref 80.0–100.0)
Platelets: 191 10*3/uL (ref 150–400)
RBC: 4.07 MIL/uL — ABNORMAL LOW (ref 4.22–5.81)
RDW: 14.6 % (ref 11.5–15.5)
WBC: 9.8 10*3/uL (ref 4.0–10.5)
nRBC: 0 % (ref 0.0–0.2)

## 2018-03-27 LAB — TROPONIN I: Troponin I: <0.03 ng/mL (ref <0.03)

## 2018-03-27 MED ORDER — ONDANSETRON HCL 4 MG/2ML IJ SOLN: 4.0000 mg | Freq: Four times a day (QID) | INTRAMUSCULAR | Status: DC | PRN

## 2018-03-27 MED ORDER — HEPARIN SODIUM (PORCINE) 5000 UNIT/ML IJ SOLN
5000.0000 [IU] | Freq: Three times a day (TID) | INTRAMUSCULAR | Status: DC
  Administered 2018-03-27 – 2018-03-30 (×8): 5000 [IU] via SUBCUTANEOUS
  Filled 2018-03-27 (×8): qty 1

## 2018-03-27 MED ORDER — BISACODYL 5 MG PO TBEC: 5.0000 mg | DELAYED_RELEASE_TABLET | Freq: Every day | ORAL | Status: DC | PRN

## 2018-03-27 MED ORDER — SODIUM CHLORIDE 0.9 % IV SOLN
1.0000 g | Freq: Once | INTRAVENOUS | Status: AC
  Administered 2018-03-27: 1 g via INTRAVENOUS
  Filled 2018-03-27: qty 10

## 2018-03-27 MED ORDER — ATORVASTATIN CALCIUM 10 MG PO TABS
10.0000 mg | ORAL_TABLET | Freq: Every day | ORAL | Status: DC
  Administered 2018-03-27 – 2018-03-29 (×3): 10 mg via ORAL
  Filled 2018-03-27 (×3): qty 1

## 2018-03-27 MED ORDER — SODIUM CHLORIDE 0.9 % IV SOLN
INTRAVENOUS | Status: DC
  Administered 2018-03-27 – 2018-03-30 (×5): via INTRAVENOUS

## 2018-03-27 MED ORDER — ACETAMINOPHEN 650 MG RE SUPP: 650.0000 mg | Freq: Four times a day (QID) | RECTAL | Status: DC | PRN

## 2018-03-27 MED ORDER — SENNA 8.6 MG PO TABS
1.0000 | ORAL_TABLET | Freq: Two times a day (BID) | ORAL | Status: DC
  Administered 2018-03-27 – 2018-03-29 (×4): 8.6 mg via ORAL
  Filled 2018-03-27 (×6): qty 1

## 2018-03-27 MED ORDER — PATIROMER SORBITEX CALCIUM 8.4 G PO PACK
16.8000 g | PACK | Freq: Every day | ORAL | Status: DC
  Administered 2018-03-27 – 2018-03-28 (×2): 16.8 g via ORAL
  Filled 2018-03-27 (×2): qty 2

## 2018-03-27 MED ORDER — ONDANSETRON HCL 4 MG PO TABS: 4.0000 mg | ORAL_TABLET | Freq: Four times a day (QID) | ORAL | Status: DC | PRN

## 2018-03-27 MED ORDER — SODIUM CHLORIDE 0.9 % IV BOLUS
1000.0000 mL | Freq: Once | INTRAVENOUS | Status: AC
  Administered 2018-03-27: 1000 mL via INTRAVENOUS

## 2018-03-27 MED ORDER — ASPIRIN EC 325 MG PO TBEC
325.0000 mg | DELAYED_RELEASE_TABLET | Freq: Every day | ORAL | Status: DC
  Administered 2018-03-27 – 2018-03-30 (×4): 325 mg via ORAL
  Filled 2018-03-27 (×4): qty 1

## 2018-03-27 MED ORDER — ACETAMINOPHEN 325 MG PO TABS: 650.0000 mg | ORAL_TABLET | Freq: Four times a day (QID) | ORAL | Status: DC | PRN

## 2018-03-27 MED ORDER — ALBUTEROL SULFATE (2.5 MG/3ML) 0.083% IN NEBU: 2.5000 mg | INHALATION_SOLUTION | RESPIRATORY_TRACT | Status: DC | PRN

## 2018-03-27 MED ORDER — AMLODIPINE BESYLATE 5 MG PO TABS
5.0000 mg | ORAL_TABLET | Freq: Every day | ORAL | Status: DC
  Administered 2018-03-27 – 2018-03-30 (×4): 5 mg via ORAL
  Filled 2018-03-27 (×4): qty 1

## 2018-03-27 MED ORDER — SENNOSIDES-DOCUSATE SODIUM 8.6-50 MG PO TABS: 1.0000 | ORAL_TABLET | Freq: Every evening | ORAL | Status: DC | PRN

## 2018-03-27 NOTE — ED Triage Notes (Signed)
Patient presents to the ED from doctor's office due to elevated potassium and elevated kidney function.  Patient had lab work yesterday and MD requested that patient had labs repeated today and then the nurse today called and instructed to come to the ED after getting the labs.  Patient was seen originally for lab work prior to normal 6 month check up.  Patient had surgery last week at urology clinic to have stents placed and a kidney stone removed.  Patient has also had a recent hospital admission in the past month.

## 2018-03-27 NOTE — ED Notes (Signed)
Pt changed and urine sample collected. Pt and wife aware that medication will cause BM.

## 2018-03-27 NOTE — ED Notes (Signed)
Pt given ginger ale at this time.  

## 2018-03-27 NOTE — Progress Notes (Signed)
Advanced Care Plan.  Purpose of Encounter: CODE STATUS. Parties in Attendance: The patient and his wife. Patient's Decisional Capacity: Yes. Medical Story: Robert [name]  is a 84 y.o. male with a known history of aortic atherosclerosis, arthritis, hypertension, hyperlipidemia, TIA, nephrolithiasis and dengue fever.  The patient is being admitted for hyperkalemia and acute renal failure.  I discussed with the patient about patient current condition, prognosis and CODE STATUS.  The patient wants to be resuscitated and intubated if he has cardiopulmonary arrest. Plan:  Code Status: Full code. Time spent discussing advance care planning: 17 minutes.

## 2018-03-27 NOTE — ED Triage Notes (Signed)
Sent by pcp for abnormal labs.  Potassium high

## 2018-03-27 NOTE — H&P (Signed)
Robert [name] at [place] NAME: Robert [name]    MR#:  [id number]  DATE OF BIRTH:  1920/08/12  DATE OF ADMISSION:  03/27/2018  PRIMARY CARE PHYSICIAN: Dion Body, MD   REQUESTING/REFERRING PHYSICIAN: Dr. Kerman Passey  CHIEF COMPLAINT:   Chief Complaint  Patient presents with  . Abnormal Lab   Abnormal lab. HISTORY OF PRESENT ILLNESS:  Robert [name]  is a 84 y.o. male with a known history of aortic atherosclerosis, arthritis, hypertension, hyperlipidemia, TIA, nephrolithiasis and dengue fever.  The patient presents the ED with above chief complaint.  He had a lithotripsy and a left ureteral stent exchange 4 days ago.  He denies any fever, chills, dysuria, hematuria, urine urgency, frequency or incontinence.  He has been taking Bactrim for about 1 week since procedure.  He said he has poor oral intake.  He also complains of rash a couple days ago.  He is found high potassium at 5.8 and renal failure and sent to ED by his doctor. PAST MEDICAL HISTORY:   Past Medical History:  Diagnosis Date  . Aortic atherosclerosis ([place]) 03/01/2016  . Arthritis   . 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 High cholesterol   . History of kidney stones   . Hypertension   . TIA (transient ischemic attack)   . 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 CYSTOSCOPY W/ RETROGRADES Left 03/23/2018   Procedure: CYSTOSCOPY WITH RETROGRADE PYELOGRAM;  Surgeon: Billey Co, MD;  Location: [facility];  Service: Urology;  Laterality: Left;  . CYSTOSCOPY WITH STENT PLACEMENT Left 02/20/2018   Procedure: CYSTOSCOPY WITH STENT PLACEMENT;  Surgeon: Billey Co, MD;  Location: [facility] OR[facility];  Service: Urology;  Laterality: Left;  . CYSTOSCOPY/URETEROSCOPY/HOLMIUM LASER/STENT PLACEMENT Left 03/23/2018   Procedure: CYSTOSCOPY/URETEROSCOPY/HOLMIUM LASER/STENT Exchange;  Surgeon: Billey Co, MD;   Location: [facility] OR[facility];  Service: Urology;  Laterality: Left;  left stent exchange  . LEG SURGERY     broken tib/fib  . TOTAL HIP ARTHROPLASTY Bilateral     SOCIAL HISTORY:   Social History   Tobacco Use  . Smoking status: Former Smoker    Types: Pipe  . Smokeless tobacco: Never Used  Substance Use Topics  . Alcohol use: Yes    Alcohol/week: 7.0 standard drinks    Types: 7 Glasses of wine per week    FAMILY HISTORY:   Family History  Problem Relation Age of Onset  . Prostate cancer Neg Hx   . Bladder Cancer Neg Hx   . Kidney cancer Neg Hx     DRUG ALLERGIES:   Allergies  Allergen Reactions  . Morphine And Related Nausea And Vomiting  . Tramadol Nausea And Vomiting    REVIEW OF SYSTEMS:   Review of Systems  Constitutional: Negative for chills, fever and malaise/fatigue.  HENT: Negative for sore throat.   Eyes: Negative for blurred vision and double vision.  Respiratory: Negative for cough, hemoptysis, shortness of breath, wheezing and stridor.   Cardiovascular: Negative for chest pain, palpitations, orthopnea and leg swelling.  Gastrointestinal: Negative for abdominal pain, blood in stool, diarrhea, melena, nausea and vomiting.  Genitourinary: Negative for dysuria, flank pain and hematuria.  Musculoskeletal: Negative for back pain and joint pain.  Skin: Positive for rash.  Neurological: Negative for dizziness, sensory change, focal weakness, seizures, loss of consciousness, weakness and headaches.  Endo/Heme/Allergies:  Negative for polydipsia.  Psychiatric/Behavioral: Negative for depression. The patient is not nervous/anxious.     MEDICATIONS AT HOME:   Prior to Admission medications   Medication Sig Start Date End Date Taking? Authorizing Provider  amLODipine (NORVASC) 5 MG tablet Take 5 mg by mouth daily.   Yes [provider]  aspirin EC 325 MG tablet Take 325 mg by mouth daily.    Yes [provider]  atorvastatin (LIPITOR) 10 MG tablet  Take 10 mg by mouth at bedtime.    Yes [provider]  losartan (COZAAR) 50 MG tablet Take 50 mg by mouth at bedtime.   Yes [provider]  nystatin (MYCOSTATIN/NYSTOP) powder Apply topically 4 (four) times daily as needed. 03/19/18  Yes Hollice Espy, MD  Omega-3 Fatty Acids (FISH OIL PO) Take 1 capsule by mouth daily.   Yes [provider]  sulfamethoxazole-trimethoprim (BACTRIM DS,SEPTRA DS) 800-160 MG tablet Take 1 tablet by mouth daily. Start after done with Cipro is finished 03/23/18  Yes Billey Co, MD  HYDROcodone-acetaminophen (NORCO/VICODIN) 5-325 MG tablet Take 1 tablet by mouth every 6 (six) hours as needed for up to 5 days for moderate pain. Patient not taking: Reported on 03/27/2018 03/23/18 03/28/18  Billey Co, MD  tamsulosin (FLOMAX) 0.4 MG CAPS capsule Take 1 capsule (0.4 mg total) by mouth daily after supper. Patient not taking: Reported on 03/27/2018 02/22/18   Max Sane, MD      VITAL SIGNS:  Blood pressure (!) 149/71, pulse 65, temperature (!) 97.5 F (36.4 C), resp. rate (!) 21, height 6' (1.829 m), weight 90.7 kg, SpO2 95 %.  PHYSICAL EXAMINATION:  Physical Exam  GENERAL:  84 y.o.-year-old patient lying in the bed with no acute distress.  EYES: Pupils equal, round, reactive to light and accommodation. No scleral icterus. Extraocular muscles intact.  HEENT: Head atraumatic, normocephalic. Oropharynx and nasopharynx clear.  NECK:  Supple, no jugular venous distention. No thyroid enlargement, no tenderness.  LUNGS: Normal breath sounds bilaterally, no wheezing, rales,rhonchi or crepitation. No use of accessory muscles of respiration.  CARDIOVASCULAR: S1, S2 normal. No murmurs, rubs, or gallops.  ABDOMEN: Soft, nontender, nondistended. Bowel sounds present. No organomegaly or mass.  EXTREMITIES: No pedal edema, cyanosis, or clubbing.  NEUROLOGIC: Cranial nerves II through XII are intact. Muscle strength 5/5 in all extremities.  Sensation intact. Gait not checked.  PSYCHIATRIC: The patient is alert and oriented x 3.  SKIN: Positive for rash, no lesion, or ulcer.   LABORATORY PANEL:   CBC Recent Labs  Lab 03/27/18 1417  WBC 9.8  HGB 13.0  HCT 40.1  PLT 191   ------------------------------------------------------------------------------------------------------------------  Chemistries  Recent Labs  Lab 03/27/18 1417  NA 140  K 5.8*  CL 114*  CO2 20*  GLUCOSE 109*  BUN 39*  CREATININE 2.28*  CALCIUM 8.8*   ------------------------------------------------------------------------------------------------------------------  Cardiac Enzymes Recent Labs  Lab 03/27/18 1417  TROPONINI <0.03   ------------------------------------------------------------------------------------------------------------------  RADIOLOGY:  Dg Abdomen 1 View  Result Date: 03/27/2018 CLINICAL DATA:  Check ureteral stent placement EXAM: ABDOMEN - 1 VIEW COMPARISON:  02/19/2018 FINDINGS: Left ureteral stent is noted. No intraoperative films are available for comparison although the stent appears slightly lower than that expected. No definitive ureteral stones are seen. Degenerative changes of lumbar spine are noted. No soft tissue changes are seen. 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 Mild retained fecal material is seen. IMPRESSION: Left ureteral stent appears slightly inferior than expected although remains in the left ureter. Electronically  Signed   By: Inez Catalina M.D.   On: 03/27/2018 17:33      IMPRESSION AND PLAN:   Acute renal failure and hyperkalemia. The patient will be admitted to medical floor. Hold Bactrim and losartan, start normal saline IV, follow-up BMP.  Nephrolithiasis, status posterior lithotripsy and left ureteral stent exchange.  No procedure indication per urologist.  Hypertension.  Continue Norvasc but hold losartan due to acute renal failure.  TIA.  Continue aspirin and Lipitor.  All the  records are reviewed and case discussed with ED provider. Management plans discussed with the patient, his wife and they are in agreement.  CODE STATUS: Full code.  TOTAL TIME TAKING CARE OF THIS PATIENT: 36 minutes.    Demetrios Loll M.D on 03/27/2018 at 6:51 PM  Between 7am to 6pm - Pager - [phone]  After 6pm go to [email or web address] - Proofreader  [facility]  Office  [phone]  CC: Primary care physician; Dion Body, MD   Note: This dictation was prepared with Dragon dictation along with smaller phrase technology. Any transcriptional errors that result from this process are unin

## 2018-03-27 NOTE — ED Provider Notes (Signed)
[facility] Emergency Department Provider Note  Time seen: 4:18 PM  I have reviewed the triage vital signs and the nursing notes.   HISTORY  Chief Complaint Abnormal Lab    HPI Robert [name] is a 84 y.o. male with a past medical history of hyperlipidemia, hypertension, TIA, presents to the emergency department for abnormal lab work.  According to the patient Friday (4 days ago) patient had a lithotripsy and left ureteral stent exchange.  Patient had routine blood work performed yesterday showing his creatinine had elevated and potassium had elevated.  Patient came back to his doctor for repeat lab work this morning which was unchanged so the patient was then to the emergency department.  Patient denies any symptoms.  Denies any abdominal pain.  States he had a mild amount of hematuria just after the procedure has not had any since.  No dysuria.  No fever.  No chest pain or palpitations.   Past Medical History:  Diagnosis Date  . Aortic atherosclerosis ([place]) 03/01/2016  . Arthritis   . 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 High cholesterol   . History of kidney stones   . Hypertension   . TIA (transient ischemic attack)   . 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 Kidney stone 02/19/2018  . UTI (urinary tract infection) 02/19/2018  . Sepsis ([place]) 02/01/2017  . HCAP (healthcare-associated pneumonia) 02/01/2017  . Acute on chronic renal failure ([place]) 02/01/2017  . HTN (hypertension) 10/03/2016  . History of CVA (cerebrovascular accident) 10/03/2016  . Pure hypercholesterolemia 10/03/2016  . Other male erectile dysfunction 06/27/2016  . Aortic atherosclerosis ([place]) 03/01/2016  . Vaccine counseling 12/30/2015    Past Surgical History:  Procedure Laterality Date  . CYSTOSCOPY W/ RETROGRADES Left 03/23/2018   Procedure: CYSTOSCOPY WITH RETROGRADE PYELOGRAM;  Surgeon: Billey Co, MD;  Location: [facility] ORS;   Service: Urology;  Laterality: Left;  . CYSTOSCOPY WITH STENT PLACEMENT Left 02/20/2018   Procedure: CYSTOSCOPY WITH STENT PLACEMENT;  Surgeon: Billey Co, MD;  Location: [facility] ORS;  Service: Urology;  Laterality: Left;  . CYSTOSCOPY/URETEROSCOPY/HOLMIUM LASER/STENT PLACEMENT Left 03/23/2018   Procedure: CYSTOSCOPY/URETEROSCOPY/HOLMIUM LASER/STENT Exchange;  Surgeon: Billey Co, MD;  Location: [facility] ORS;  Service: Urology;  Laterality: Left;  left stent exchange  . LEG SURGERY     broken tib/fib  . TOTAL HIP ARTHROPLASTY Bilateral     Prior to Admission medications   Medication Sig Start Date End Date Taking? Authorizing Provider  amLODipine (NORVASC) 5 MG tablet Take 5 mg by mouth daily.    [provider]  aspirin EC 325 MG tablet Take 325 mg by mouth daily.     [provider]  atorvastatin (LIPITOR) 10 MG tablet Take 10 mg by mouth at bedtime.     [provider]  HYDROcodone-acetaminophen (NORCO/VICODIN) 5-325 MG tablet Take 1 tablet by mouth every 6 (six) hours as needed for up to 5 days for moderate pain. 03/23/18 03/28/18  Billey Co, MD  losartan (COZAAR) 50 MG tablet Take 50 mg by mouth at bedtime.    [provider]  nystatin (MYCOSTATIN/NYSTOP) powder Apply topically 4 (four) times daily as needed. 03/19/18   Hollice Espy, MD  Omega-3 Fatty Acids (FISH OIL PO) Take 1 capsule by mouth daily.    [provider]  sulfamethoxazole-trimethoprim (BACTRIM DS,SEPTRA DS) 800-160 MG tablet Take 1 tablet  by mouth daily. Start after done with Cipro is finished 03/23/18   Billey Co, MD  tamsulosin (FLOMAX) 0.4 MG CAPS capsule Take 1 capsule (0.4 mg total) by mouth daily after supper. 02/22/18   Max Sane, MD    Allergies  Allergen Reactions  . Morphine And Related Nausea And Vomiting  . Tramadol Nausea And Vomiting    Family History  Problem Relation Age of Onset  . Prostate cancer Neg Hx   . Bladder Cancer Neg Hx   .  Kidney cancer Neg Hx     Social History Social History   Tobacco Use  . Smoking status: Former Smoker    Types: Pipe  . Smokeless tobacco: Never Used  Substance Use Topics  . Alcohol use: Yes    Alcohol/week: 7.0 standard drinks    Types: 7 Glasses of wine per week  . Drug use: No    Review of Systems Constitutional: Negative for fever. Cardiovascular: Negative for chest pain. Respiratory: Negative for shortness of breath. Gastrointestinal: Negative for abdominal pain, vomiting  Genitourinary: Small amount of hematuria on Friday after his procedure none since. Musculoskeletal: Negative for musculoskeletal complaints Skin: Negative for skin complaints  Neurological: Negative for headache All other ROS negative  ____________________________________________   PHYSICAL EXAM:  VITAL SIGNS: ED Triage Vitals [03/27/18 1411]  Enc Vitals Group     BP 121/67     Pulse Rate 83     Resp 18     Temp (!) 97.5 F (36.4 C)     Temp src      SpO2 97 %     Weight 200 lb (90.7 kg)     Height 6' (1.829 m)     Head Circumference      Peak Flow      Pain Score 0     Pain Loc      Pain Edu?      Excl. in [place]?    Constitutional: Alert and oriented. Well appearing and in no distress. Eyes: Normal exam ENT   Head: Normocephalic and atraumatic.   Mouth/Throat: Mucous membranes are moist. Cardiovascular: Normal rate, regular rhythm. No murmur Respiratory: Normal respiratory effort without tachypnea nor retractions. Breath sounds are clear Gastrointestinal: Soft and nontender. No distention. Musculoskeletal: Nontender with normal range of motion in all extremities. Neurologic:  Normal speech and language. No gross focal neurologic deficits  Skin:  Skin is warm, dry and intact.  Psychiatric: Mood and affect are normal.   ____________________________________________    EKG  EKG reviewed and interpreted by myself shows sinus rhythm 87 bpm with a narrow QRS, normal axis,  largely normal intervals besides PR prolongation, nonspecific ST changes.  ____________________________________________    RADIOLOGY  X-ray shows left ureteral stent slightly lower than expected but remains in the left ureter.  ____________________________________________   INITIAL IMPRESSION / ASSESSMENT AND PLAN / ED COURSE  Pertinent labs & imaging results that were available during my care of the patient were reviewed by me and considered in my medical decision making (see chart for details).  Patient presents to the emergency department elevated creatinine as well as an elevated potassium.  Does not take potassium supplements.  Had a left ureteral stent exchange 4 days ago.  I discussed the patient with urology they recommend a KUB to ensure that the stent is still in place although state a new stent will rarely become obstructed.  Otherwise we will IV hydrate and start the patient on Veltassa and admit to  the hospitalist service.  Patient is agreeable to plan of care.  No EKG changes consistent with hyperkalemia.  Patient's urinalysis consistent with possible urinary tract infection.  Urine culture has been sent we will cover with IV Rocephin.  We will continue with IV hydration we will dose Veltassa for hyperkalemia and admit to the hospitalist service.  ____________________________________________   FINAL CLINICAL IMPRESSION(S) / ED DIAGNOSES  Hyperkalemia Renal insufficiency    Harvest Dark, MD 03/27/18 1753

## 2018-03-28 LAB — CBC
HCT: 34.8 % — ABNORMAL LOW (ref 39.0–52.0)
Hemoglobin: 11.4 g/dL — ABNORMAL LOW (ref 13.0–17.0)
MCH: 32.2 pg (ref 26.0–34.0)
MCHC: 32.8 g/dL (ref 30.0–36.0)
MCV: 98.3 fL (ref 80.0–100.0)
Platelets: 153 10*3/uL (ref 150–400)
RBC: 3.54 MIL/uL — ABNORMAL LOW (ref 4.22–5.81)
RDW: 14.3 % (ref 11.5–15.5)
WBC: 8.2 10*3/uL (ref 4.0–10.5)
nRBC: 0 % (ref 0.0–0.2)

## 2018-03-28 LAB — BASIC METABOLIC PANEL
Anion gap: 5 (ref 5–15)
BUN: 32 mg/dL — ABNORMAL HIGH (ref 8–23)
CO2: 18 mmol/L — ABNORMAL LOW (ref 22–32)
Calcium: 9 mg/dL (ref 8.9–10.3)
Chloride: 115 mmol/L — ABNORMAL HIGH (ref 98–111)
Creatinine, Ser: 1.85 mg/dL — ABNORMAL HIGH (ref 0.61–1.24)
GFR calc Af Amer: 35 mL/min — ABNORMAL LOW (ref >60)
GFR calc non Af Amer: 30 mL/min — ABNORMAL LOW (ref >60)
Glucose, Bld: 83 mg/dL (ref 70–99)
Potassium: 6 mmol/L — ABNORMAL HIGH (ref 3.5–5.1)
Sodium: 138 mmol/L (ref 135–145)

## 2018-03-28 LAB — POTASSIUM: Potassium: 5.4 mmol/L — ABNORMAL HIGH (ref 3.5–5.1)

## 2018-03-28 MED ORDER — SODIUM ZIRCONIUM CYCLOSILICATE 5 G PO PACK
5.0000 g | PACK | Freq: Two times a day (BID) | ORAL | Status: DC
  Administered 2018-03-28 – 2018-03-30 (×4): 5 g via ORAL
  Filled 2018-03-28 (×6): qty 1

## 2018-03-28 MED ORDER — SODIUM CHLORIDE 0.9 % IV SOLN
1.0000 g | INTRAVENOUS | Status: DC
  Administered 2018-03-28 – 2018-03-29 (×2): 1 g via INTRAVENOUS
  Filled 2018-03-28: qty 1
  Filled 2018-03-28: qty 10

## 2018-03-28 MED ORDER — TAMSULOSIN HCL 0.4 MG PO CAPS
0.4000 mg | ORAL_CAPSULE | Freq: Every day | ORAL | Status: DC
  Administered 2018-03-28 – 2018-03-30 (×3): 0.4 mg via ORAL
  Filled 2018-03-28 (×3): qty 1

## 2018-03-29 LAB — BASIC METABOLIC PANEL
Anion gap: 6 (ref 5–15)
BUN: 24 mg/dL — ABNORMAL HIGH (ref 8–23)
CO2: 18 mmol/L — ABNORMAL LOW (ref 22–32)
Calcium: 9.5 mg/dL (ref 8.9–10.3)
Chloride: 113 mmol/L — ABNORMAL HIGH (ref 98–111)
Creatinine, Ser: 1.5 mg/dL — ABNORMAL HIGH (ref 0.61–1.24)
GFR calc Af Amer: 45 mL/min — ABNORMAL LOW (ref >60)
GFR calc non Af Amer: 39 mL/min — ABNORMAL LOW (ref >60)
Glucose, Bld: 109 mg/dL — ABNORMAL HIGH (ref 70–99)
Potassium: 5.2 mmol/L — ABNORMAL HIGH (ref 3.5–5.1)
SODIUM: 137 mmol/L (ref 135–145)

## 2018-03-29 LAB — MAGNESIUM: Magnesium: 1.8 mg/dL (ref 1.7–2.4)

## 2018-03-29 MED ORDER — SODIUM CHLORIDE 0.9 % IV SOLN
1.0000 g | Freq: Four times a day (QID) | INTRAVENOUS | Status: DC
  Administered 2018-03-29 – 2018-03-30 (×4): 1 g via INTRAVENOUS
  Filled 2018-03-29: qty 1000
  Filled 2018-03-29 (×2): qty 1
  Filled 2018-03-29: qty 1000
  Filled 2018-03-29 (×2): qty 1

## 2018-03-29 NOTE — Plan of Care (Signed)
Foley catheter removed. Due to void by around 2000. Monitoring potassium levels. Poor oral intake and has to be encouraged.  Problem: Education: Goal: Knowledge of General Education information will improve Description Including pain rating scale, medication(s)/side effects and non-pharmacologic comfort measures Outcome: Progressing   Problem: Health Behavior/Discharge Planning: Goal: Ability to manage health-related needs will improve Outcome: Progressing   Problem: Clinical Measurements: Goal: Ability to maintain clinical measurements within normal limits will improve Outcome: Progressing Goal: Will remain free from infection Outcome: Progressing Goal: Diagnostic test results will improve Outcome: Progressing Goal: Respiratory complications will improve Outcome: Progressing Goal: Cardiovascular complication will be avoided Outcome: Progressing   Problem: Activity: Goal: Risk for activity intolerance will decrease Outcome: Progressing   Problem: Nutrition: Goal: Adequate nutrition will be maintained Outcome: Progressing   Problem: Coping: Goal: Level of anxiety will decrease Outcome: Progressing   Problem: Elimination: Goal: Will not experience complications related to bowel motility Outcome: Progressing Goal: Will not experience complications related to urinary retention Outcome: Progressing   Problem: Pain Managment: Goal: General experience of comfort will improve Outcome: Progressing   Problem: Safety: Goal: Ability to remain free from injury will improve Outcome: Progressing   Problem: Skin Integrity: Goal: Risk for impaired skin integrity will decrease Outcome: Progressing

## 2018-03-29 NOTE — Progress Notes (Signed)
[facility] [place] NAME: Robert [name]    MR#:  [id number]  DATE OF BIRTH:  July 07, 1920  SUBJECTIVE:  CHIEF COMPLAINT:   Chief Complaint  Patient presents with  . Abnormal Lab   Recent ureteral stent by Urology, started on bactrim. PMD found high K sent to ER. Slight Better renal failure and K after IV fluids. No complains.  REVIEW OF SYSTEMS:  CONSTITUTIONAL: No fever,have fatigue or weakness.  EYES: No blurred or double vision.  EARS, NOSE, AND THROAT: No tinnitus or ear pain.  RESPIRATORY: No cough, shortness of breath, wheezing or hemoptysis.  CARDIOVASCULAR: No chest pain, orthopnea, edema.  GASTROINTESTINAL: No nausea, vomiting, diarrhea or abdominal pain.  GENITOURINARY: No dysuria, hematuria.  ENDOCRINE: No polyuria, nocturia,  HEMATOLOGY: No anemia, easy bruising or bleeding SKIN: No rash or lesion. MUSCULOSKELETAL: No joint pain or arthritis.   NEUROLOGIC: No tingling, numbness, weakness.  PSYCHIATRY: No anxiety or depression.   ROS  DRUG ALLERGIES:   Allergies  Allergen Reactions  . Morphine And Related Nausea And Vomiting  . Tramadol Nausea And Vomiting    VITALS:  Blood pressure (!) 165/72, pulse (!) 58, temperature 98 F (36.7 C), temperature source Oral, resp. rate 16, height 6' (1.829 m), weight 92.3 kg, SpO2 95 %.  PHYSICAL EXAMINATION:   GENERAL:  84 y.o.-year-old patient lying in the bed with no acute distress.  EYES: Pupils equal, round, reactive to light and accommodation. No scleral icterus. Extraocular muscles intact.  HEENT: Head atraumatic, normocephalic. Oropharynx and nasopharynx clear.  NECK:  Supple, no jugular venous distention. No thyroid enlargement, no tenderness.  LUNGS: Normal breath sounds bilaterally, no wheezing, rales,rhonchi or crepitation. No use of accessory muscles of respiration.  CARDIOVASCULAR: S1, S2 normal. No murmurs, rubs, or gallops.  ABDOMEN: Soft, nontender, nondistended.  Bowel sounds present. No organomegaly or mass.  EXTREMITIES: No pedal edema, cyanosis, or clubbing.  NEUROLOGIC: Cranial nerves II through XII are intact. Muscle strength 4/5 in all extremities. Sensation intact. Gait not checked.  PSYCHIATRIC: The patient is alert and oriented x 3.  SKIN: Positive for rash, no lesion, or ulcer.   Physical Exam LABORATORY PANEL:   CBC Recent Labs  Lab 03/28/18 0446  WBC 8.2  HGB 11.4*  HCT 34.8*  PLT 153   ------------------------------------------------------------------------------------------------------------------  Chemistries  Recent Labs  Lab 03/28/18 0446 03/28/18 1507  NA 138  --   K 6.0* 5.4*  CL 115*  --   CO2 18*  --   GLUCOSE 83  --   BUN 32*  --   CREATININE 1.85*  --   CALCIUM 9.0  --    ------------------------------------------------------------------------------------------------------------------  Cardiac Enzymes Recent Labs  Lab 03/27/18 1417  TROPONINI <0.03   ------------------------------------------------------------------------------------------------------------------  RADIOLOGY:  Dg Abdomen 1 View  Result Date: 03/27/2018 CLINICAL DATA:  Check ureteral stent placement EXAM: ABDOMEN - 1 VIEW COMPARISON:  02/19/2018 FINDINGS: Left ureteral stent is noted. No intraoperative films are available for comparison although the stent appears slightly lower than that expected. No definitive ureteral stones are seen. Degenerative changes of lumbar spine are noted. No soft tissue changes are seen. 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 Mild retained fecal material is seen. IMPRESSION: Left ureteral stent appears slightly inferior than expected although remains in the left ureter. Electronically Signed   By: Robert [name] M.D.   On: 03/27/2018 17:33    ASSESSMENT AND PLAN:   Active Problems:   ARF (acute renal failure) (HCC)  *  Acute renal failure and hyperkalemia. Hold Bactrim and losartan, start normal saline IV,  follow-up BMP.  recent stent. Cont flomax and follow with urology as planned.  * UTI   Recent sent, cont rocephin, follow urine cx.  * Nephrolithiasis, status posterior lithotripsy and left ureteral stent exchange.  No procedure indication per urologist.  * Hypertension.  Continue Norvasc but hold losartan due to acute renal failure.  * TIA.  Continue aspirin and Lipitor.  Pt said, he had ran out of flomax prescriptions, Will resume and follow with Urology.  All the records are reviewed and case discussed with Care Management/Social Workerr. Management plans discussed with the patient, family and they are in agreement.  CODE STATUS: Full.  TOTAL TIME TAKING CARE OF THIS PATIENT: 35 minutes.    POSSIBLE D/C IN 1-2 DAYS, DEPENDING ON CLINICAL CONDITION.   Robert [name] M.D on 03/29/2018   Between 7am to 6pm - Pager - [phone]  After 6pm go to [email or web address] - password [email or web address]  Office  [phone]  CC: Primary care physician; Robert Body, MD  Note: This dictation was prepared with Dragon dictation along with smaller phrase technology. Any transcriptional errors that result from this process are unintentional.

## 2018-03-29 NOTE — Progress Notes (Signed)
PT Cancellation Note  Patient Details Name: Robert [name] MRN: [id number] DOB: [birth year]   Cancelled Treatment:    Reason Eval/Treat Not Completed: Medical issues which prohibited therapy.  Pt with elevated potassium and additionally showing multiform PVCs on tele monitor.  Per protocol, will hold PT until pt more medically appropriate for exertional activity.    Collie Siad PT, DPT 03/29/2018, 10:57 AM

## 2018-03-29 NOTE — Progress Notes (Signed)
OT Cancellation Note  Patient Details Name: Robert [name] MRN: [id number] DOB: Oct 05, 1920   Cancelled Treatment:    Reason Eval/Treat Not Completed: Medical issues which prohibited therapy. Order received, chart reviewed. Pt with elevated potassium and additionally showing multiform PVCs on tele monitor.  Per protocol, will hold OT until pt more medically appropriate for exertional activity.   Jeni Salles, MPH, MS, OTR/L ascom [phone] 03/29/18, 11:00 AM

## 2018-03-30 LAB — URINE CULTURE: Culture: 60000 — AB

## 2018-03-30 LAB — BASIC METABOLIC PANEL
Anion gap: 6 (ref 5–15)
BUN: 23 mg/dL (ref 8–23)
CO2: 18 mmol/L — ABNORMAL LOW (ref 22–32)
Calcium: 8.9 mg/dL (ref 8.9–10.3)
Chloride: 115 mmol/L — ABNORMAL HIGH (ref 98–111)
Creatinine, Ser: 1.39 mg/dL — ABNORMAL HIGH (ref 0.61–1.24)
GFR calc Af Amer: 50 mL/min — ABNORMAL LOW (ref >60)
GFR, EST NON AFRICAN AMERICAN: 43 mL/min — AB (ref >60)
GLUCOSE: 88 mg/dL (ref 70–99)
Potassium: 4.4 mmol/L (ref 3.5–5.1)
Sodium: 139 mmol/L (ref 135–145)

## 2018-03-30 MED ORDER — AMOXICILLIN 500 MG PO CAPS: 500.0000 mg | ORAL_CAPSULE | Freq: Four times a day (QID) | ORAL | 0 refills | Status: AC

## 2018-03-30 MED ORDER — AMOXICILLIN 500 MG PO CAPS
500.0000 mg | ORAL_CAPSULE | Freq: Four times a day (QID) | ORAL | Status: DC
  Filled 2018-03-30 (×3): qty 1

## 2018-03-30 MED ORDER — SENNA 8.6 MG PO TABS: 1.0000 | ORAL_TABLET | Freq: Two times a day (BID) | ORAL | 0 refills | Status: DC

## 2018-03-30 NOTE — Evaluation (Signed)
Occupational Therapy Evaluation Patient Details Name: RIYAD KEENA MRN: [id number] DOB: Feb 14, 1921 Today's Date: 03/30/2018    History of Present Illness Pt is a 84 y/o Male who was admitted to [facility] from his doctor's office due to elevated potassium level.  Pt s/p lithotripsy and L ureteral stent exchange 4 days PTA.  Pt found to have a UTI.  Pt's PMH includes TIA.   Clinical Impression   Pt. Presents with weakness, limited activity tolerance, and limited functional mobility which limits his ability to complete basic ADL and IADL functioning. Pt. Resides at home with his wife.   Pt. Required assist with ADL, and IADL tasks from his wife. Pt. Required assist getting in, and out of the shower, drying off after the shower, and donning socks. Pt. required assist with meal preparation, and performing home management, and household tasks. Pt. walks with his wife's assist to the [facility] beauty shop once a week, and goes out daily for breakfast. Pt. education was provided about safety during standing ADL tasks, requiring consistent cues for hand placement. Pt. Required min Guard in standing during ADL tasks, and required min guard for grooming standing at the sinkside, minA for clothing negotiation skills, modA toileting  For hygiene care at the [place]. Pt. could benefit from OT services for ADL training, A/E training, and pt. education about home modification, and DME tasks. Pt. Plans to return home upon discharge, with family assistance as needed. No follow-up OT services are warranted at this time.     Follow Up Recommendations  No OT follow up    Equipment Recommendations       Recommendations for Other Services       Precautions / Restrictions Precautions Precautions: Fall Restrictions Weight Bearing Restrictions: No      Mobility Bed Mobility Overal bed mobility: Needs Assistance Bed Mobility: Sit to Supine     Supine to sit: Min guard     General bed mobility comments:  Increased effort and time but no physical assist required  Transfers Overall transfer level: Needs assistance Equipment used: Rolling walker (2 wheeled) Transfers: Sit to/from Stand Sit to Stand: Min guard         General transfer comment: Cues for safety, and hand placement    Balance Overall balance assessment: Needs assistance;History of Falls Sitting-balance support: No upper extremity supported;Feet supported Sitting balance-Leahy Scale: Good     Standing balance support: Single extremity supported;During functional activity Standing balance-Leahy Scale: Poor Standing balance comment:                           ADL either performed or assessed with clinical judgement   ADL   Eating/Feeding: Independent   Grooming: Standing;Min guard   Upper Body Bathing: Independent   Lower Body Bathing: Moderate assistance   Upper Body Dressing : Min guard   Lower Body Dressing: Minimal assistance   Toilet Transfer: BSC;Min guard   Toileting- Clothing Manipulation and Hygiene: Moderate assistance       Functional mobility during ADLs: Min guard       Vision Baseline Vision/History: Wears glasses Wears Glasses: At all times       Perception     Praxis      Pertinent Vitals/Pain       Hand Dominance     Extremity/Trunk Assessment Upper Extremity Assessment Upper Extremity Assessment: Generalized weakness          Communication Communication Communication: No difficulties   Cognition  Arousal/Alertness: Awake/alert Behavior During Therapy: WFL for tasks assessed/performed Overall Cognitive Status: Within Functional Limits for tasks assessed                                     General Comments       Exercises   Shoulder Instructions      Home Living Family/patient expects to be discharged to:: Private residence([place]) Living Arrangements: Spouse/significant other Available Help at Discharge:  Family;Available 24 hours/day Type of Home: House Home Access: Level entry     Home Layout: One level     Bathroom Shower/Tub: Walk-in shower         Home Equipment: Shower seat;Grab bars - tub/shower;Hand held Tourist information centre manager - 4 wheels;Cane - single point;Bedside commode          Prior Functioning/Environment Level of Independence: Needs assistance  Gait / Transfers Assistance Needed: Pt ambulates with rollator.   ADL's / Homemaking Assistance Needed: Pt. required assist with ADLS, IADLs, shower transfers, drying off, and LE dressing   Comments: Wife assist pt. with walking to the [facility] beauty shop1x a week. Eats breakfast out.        OT Problem List: Decreased strength;Decreased knowledge of use of DME or AE;Decreased activity tolerance      OT Treatment/Interventions: Self-care/ADL training;Therapeutic exercise;Patient/family education;Therapeutic activities;DME and/or AE instruction    OT Goals(Current goals can be found in the care plan section) Acute Rehab OT Goals Patient Stated Goal: To return home OT Goal Formulation: With patient Potential to Achieve Goals: Good  OT Frequency: Min 2X/week   Barriers to D/C:            Co-evaluation              AM-PAC OT "6 Clicks" Daily Activity     Outcome Measure Help from another person eating meals?: None Help from another person taking care of personal grooming?: A Little Help from another person toileting, which includes using toliet, bedpan, or urinal?: A Lot Help from another person bathing (including washing, rinsing, drying)?: A Little Help from another person to put on and taking off regular upper body clothing?: A Little Help from another person to put on and taking off regular lower body clothing?: A Little 6 Click Score: 18   End of Session    Activity Tolerance: Patient tolerated treatment well Patient left: in bed;with bed alarm set;with family/visitor present  OT Visit Diagnosis: Muscle  weakness (generalized) (M62.81)                Time: 1275-1700 OT Time Calculation (min): 45 min Charges:  OT Evaluation $OT Eval High Complexity: 1 High OT Treatments $Self Care/Home Management : 8-22 mins  Harrel Carina, MS, OTR/L  Harrel Carina 03/30/2018, 12:36 PM

## 2018-03-30 NOTE — Care Management Note (Signed)
Case Management Note  Patient Details  Name: Robert [name] MRN: [id number] Date of Birth: 10-31-1920   Patient to discharge today. Patient was admitted for ARF.  Patient lives at [place] living with his wife.  Patient is currently open with [facility].  Patient has a Rollator, cane and grab bars in the home.   PCP [place]. Pharmacy [facility].  Patient states that his wife provides transportation.  Denies issues obtaining medications. PT has assessed patient and recommends home health.  Resumptions orders have been placed.  Joelene Millin with [facility] notified of discharge.  Wife provided with PCS services list at her request for additional resources.    Subjective/Objective:                    Action/Plan:   Expected Discharge Date:  03/30/18               Expected Discharge Plan:  [place]  In-House Referral:     Discharge planning Services  CM Consult  Post Acute Care Choice:  Resumption of Svcs/PTA Provider Choice offered to:     DME Arranged:    DME Agency:     HH Arranged:  RN, PT [place] Agency:  [facility]  Status of Service:  Completed, signed off  If discussed at [place] of Stay Meetings, dates discussed:    Additional Comments:  Beverly Sessions, RN 03/30/2018, 2:58 PM

## 2018-03-30 NOTE — Discharge Summary (Signed)
[name] at [place] NAME: Robert [name]    MR#:  [id number]  DATE OF BIRTH:  1920/10/27  DATE OF ADMISSION:  03/27/2018 ADMITTING PHYSICIAN: Demetrios Loll, MD  DATE OF DISCHARGE: 03/30/2018  PRIMARY CARE PHYSICIAN: Dion Body, MD    ADMISSION DIAGNOSIS:  Hyperkalemia [E87.5] Urinary tract infection with hematuria, site unspecified [N39.0, R31.9]  DISCHARGE DIAGNOSIS:  Active Problems:   ARF (acute renal failure) ([place])   SECONDARY DIAGNOSIS:   Past Medical History:  Diagnosis Date  . Aortic atherosclerosis ([place]) 03/01/2016  . Arthritis   . 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 High cholesterol   . History of kidney stones   . Hypertension   . TIA (transient ischemic attack)   . 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 Hold Bactrim and losartan, start normal saline IV, follow-up BMP.  recent stent. Cont flomax and follow with urology as planned. Potassium level responded to [facility]  * UTI   Recent sent, cont rocephin, follow urine cx.  Change to ampicilline.  Give amoxicilline on discharge.  * Nephrolithiasis, status posterior lithotripsy and left ureteral stent exchange. No procedure indication per urologist. Follow in clinic as advised.  Started on flomax as advised by urologist.  * Hypertension. Continue Norvasc but hold losartan due to acute renal failure.  * TIA. Continue aspirin and Lipitor.  PT eval was done, suggested [facility] PT.  DISCHARGE CONDITIONS:   Stable.  CONSULTS OBTAINED:    DRUG ALLERGIES:   Allergies  Allergen Reactions  . Morphine And Related Nausea And Vomiting  . Tramadol Nausea And Vomiting    DISCHARGE MEDICATIONS:   Allergies as of 03/30/2018      Reactions   Morphine And Related Nausea And Vomiting   Tramadol Nausea And Vomiting      Medication List    STOP taking these medications    HYDROcodone-acetaminophen 5-325 MG tablet Commonly known as:  NORCO/VICODIN   losartan 50 MG tablet Commonly known as:  COZAAR   sulfamethoxazole-trimethoprim 800-160 MG tablet Commonly known as:  BACTRIM DS,SEPTRA DS     TAKE these medications   amLODipine 5 MG tablet Commonly known as:  NORVASC Take 5 mg by mouth daily.   amoxicillin 500 MG capsule Commonly known as:  AMOXIL Take 1 capsule (500 mg total) by mouth 4 (four) times daily for 4 days.   aspirin EC 325 MG tablet Take 325 mg by mouth daily.   atorvastatin 10 MG tablet Commonly known as:  LIPITOR Take 10 mg by mouth at bedtime.   FISH OIL PO Take 1 capsule by mouth daily.   nystatin powder Commonly known as:  MYCOSTATIN/NYSTOP Apply topically 4 (four) times daily as needed.   senna 8.6 MG Tabs tablet Commonly known as:  SENOKOT Take 1 tablet (8.6 mg total) by mouth 2 (two) times daily.   tamsulosin 0.4 MG Caps capsule Commonly known as:  FLOMAX Take 1 capsule (0.4 mg total) by mouth daily after supper.        DISCHARGE INSTRUCTIONS:    Follow with urology in clinic .  If you experience worsening of your admission symptoms, develop shortness of breath, life threatening emergency, suicidal or homicidal thoughts you must seek medical attention immediately by calling 911 or calling your MD immediately  if symptoms less severe.  You Must read complete instructions/literature along with all the  possible adverse reactions/side effects for all the Medicines you take and that have been prescribed to you. Take any new Medicines after you have completely understood and accept all the possible adverse reactions/side effects.   Please note  You were cared for by a hospitalist during your hospital stay. If you have any questions about your discharge medications or the care you received while you were in the hospital after you are discharged, you can call the unit and asked to speak with the hospitalist on call if  the hospitalist that took care of you is not available. Once you are discharged, your primary care physician will handle any further medical issues. Please note that NO REFILLS for any discharge medications will be authorized once you are discharged, as it is imperative that you return to your primary care physician (or establish a relationship with a primary care physician if you do not have one) for your aftercare needs so that they can reassess your need for medications and monitor your lab values.    Today   CHIEF COMPLAINT:   Chief Complaint  Patient presents with  . Abnormal Lab    HISTORY OF PRESENT ILLNESS:  Robert [name]  is a 84 y.o. male with a known history of aortic atherosclerosis, arthritis, hypertension, hyperlipidemia, TIA, nephrolithiasis and dengue fever.  The patient presents the ED with above chief complaint.  He had a lithotripsy and a left ureteral stent exchange 4 days ago.  He denies any fever, chills, dysuria, hematuria, urine urgency, frequency or incontinence.  He has been taking Bactrim for about 1 week since procedure.  He said he has poor oral intake.  He also complains of rash a couple days ago.  He is found high potassium at 5.8 and renal failure and sent to ED by his doctor.   VITAL SIGNS:  Blood pressure 121/61, pulse (!) 58, temperature 97.6 F (36.4 C), temperature source Oral, resp. rate 20, height 6' (1.829 m), weight 92.3 kg, SpO2 94 %.  I/O:    Intake/Output Summary (Last 24 hours) at 03/30/2018 1725 Last data filed at 03/30/2018 1311 Gross per 24 hour  Intake 2368.84 ml  Output 850 ml  Net 1518.84 ml    PHYSICAL EXAMINATION:   GENERAL:84 y.o.-year-old patient lying in the bed with no acute distress.  EYES: Pupils equal, round, reactive to light and accommodation. No scleral icterus. Extraocular muscles intact.  HEENT: Head atraumatic, normocephalic. Oropharynx and nasopharynx clear.  NECK: Supple, no jugular venous distention. No thyroid  enlargement, no tenderness.  LUNGS: Normal breath sounds bilaterally, no wheezing, rales,rhonchi or crepitation. No use of accessory muscles of respiration.  CARDIOVASCULAR: S1, S2 normal. No murmurs, rubs, or gallops.  ABDOMEN: Soft, nontender, nondistended. Bowel sounds present. No organomegaly or mass.  EXTREMITIES: No pedal edema, cyanosis, or clubbing.  NEUROLOGIC: Cranial nerves II through XII are intact. Muscle strength 4/5 in all extremities. Sensation intact. Gait not checked.  PSYCHIATRIC: The patient is alert and oriented x 3.  SKIN:Positive for rash,nolesion, or ulcer.   DATA REVIEW:   CBC Recent Labs  Lab 03/28/18 0446  WBC 8.2  HGB 11.4*  HCT 34.8*  PLT 153    Chemistries  Recent Labs  Lab 03/29/18 1040 03/30/18 0411  NA 137 139  K 5.2* 4.4  CL 113* 115*  CO2 18* 18*  GLUCOSE 109* 88  BUN 24* 23  CREATININE 1.50* 1.39*  CALCIUM 9.5 8.9  MG 1.8  --     Cardiac Enzymes Recent  Labs  Lab 03/27/18 1417  TROPONINI <0.03    Microbiology Results  Results for orders placed or performed during the hospital encounter of 03/27/18  Urine Culture     Status: Abnormal   Collection Time: 03/27/18  5:47 PM  Result Value Ref Range Status   Specimen Description   Final    URINE, RANDOM Performed at [facility], [address], [place], [place] [address]    Special Requests   Final    NONE Performed at [facility], [place], [place] [address]    Culture 60,000 COLONIES/mL ENTEROCOCCUS FAECALIS (A)  Final   Report Status 03/30/2018 FINAL  Final   Organism ID, Bacteria ENTEROCOCCUS FAECALIS (A)  Final      Susceptibility   Enterococcus faecalis - MIC*    AMPICILLIN <=2 SENSITIVE Sensitive     LEVOFLOXACIN >=8 RESISTANT Resistant     NITROFURANTOIN <=16 SENSITIVE Sensitive     VANCOMYCIN 1 SENSITIVE Sensitive     * 60,000 COLONIES/mL ENTEROCOCCUS FAECALIS    RADIOLOGY:  No results found.  EKG:   Orders placed or  performed during the hospital encounter of 03/27/18  . ED EKG  . ED EKG  . EKG      Management plans discussed with the patient, family and they are in agreement.  CODE STATUS:     Code Status Orders  (From admission, onward)         Start     Ordered   03/27/18 2006  Full code  Continuous     03/27/18 2005        Code Status History    Date Active Date Inactive Code Status Order ID Comments User Context   02/20/2018 0104 02/22/2018 1428 Full Code [id number]  Lance Coon, MD Inpatient   02/01/2017 2317 02/03/2017 2117 Full Code [id number]  Lance Coon, MD ED    Advance Directive Documentation     Most Recent Value  Type of Advance Directive  Healthcare Power of [place], Living will  Pre-existing out of facility DNR order (yellow form or pink MOST form)  -  "MOST" Form in Place?  -      TOTAL TIME TAKING CARE OF THIS PATIENT: 35 minutes.    Vaughan Basta M.D on 03/30/2018 at 5:25 PM  Between 7am to 6pm - Pager - [phone]  After 6pm go to [email or web address] - password [email or web address] [place] [facility]  Office  [phone]  CC: Primary care physician; Dion Body, MD   Note: This dictation was prepared with Dragon dictation along with smaller phrase technology. Any transcriptional errors that result from this process are unintentional.

## 2018-03-30 NOTE — Clinical Social Work Note (Signed)
Clinical Social Work Assessment  Patient Details  Name: Robert [name] MRN: [id number] Date of Birth: 12/14/1920  Date of referral:  03/30/18               Reason for consult:  Discharge Planning                Permission sought to share information with:  Facility Sport and exercise psychologist, Family Supports Permission granted to share information::  Yes, Verbal Permission Granted  Name::        Agency::     Relationship::     Contact Information:     Housing/Transportation Living arrangements for the past 2 months:  [place] Information:  Patient, Spouse Patient Interpreter Needed:  None Criminal Activity/Legal Involvement Pertinent to Current Situation/Hospitalization:  No - Comment as needed Significant Relationships:  Spouse Lives with:  Spouse Do you feel safe going back to the place where you live?  Yes Need for family participation in patient care:  Yes (Comment)  Care giving concerns:  Patient and wife reside at [facility] independent living.    Social Worker assessment / plan:  CSW spoke with patient and his wife this morning at bedside. Patient's wife stated that they were waiting on PT to assess patient at that time and were hopeful patient could return home with home health. Patient's wife stated that patient is already open with [facility].   PT arrived shortly after CSW left room and they have recommended home health. RN CM was aware and made arrangements.   Employment status:    Insurance information:    PT Recommendations:    Information / Referral to community resources:     Patient/Family's Response to care:  Patient's wife expressed appreciation for CSW assistance.  Patient/Family's Understanding of and Emotional Response to Diagnosis, Current Treatment, and Prognosis:  Patient and wife were pleased that patient was going to return home.   Emotional Assessment Appearance:  Appears stated age Attitude/Demeanor/Rapport:  (pleasant  and cooperative) Affect (typically observed):  Accepting, Adaptable, Calm Orientation:  Oriented to Self, Oriented to Place Alcohol / Substance use:  Not Applicable Psych involvement (Current and /or in the community):  No (Comment)  Discharge Needs  Concerns to be addressed:  Care Coordination Readmission within the last 30 days:  No Current discharge risk:  None Barriers to Discharge:  No Barriers Identified   Shela Leff, LCSW 03/30/2018, 2:40 PM

## 2018-03-30 NOTE — Progress Notes (Signed)
[facility] [place] NAME: Robert [name]    MR#:  [id number]  DATE OF BIRTH:  1921/02/14  SUBJECTIVE:  CHIEF COMPLAINT:   Chief Complaint  Patient presents with  . Abnormal Lab   Recent ureteral stent by Urology, started on bactrim. PMD found high K sent to ER. Slight Better renal failure and K after IV fluids. No complains. Have generalized weakness.  REVIEW OF SYSTEMS:  CONSTITUTIONAL: No fever,have fatigue or weakness.  EYES: No blurred or double vision.  EARS, NOSE, AND THROAT: No tinnitus or ear pain.  RESPIRATORY: No cough, shortness of breath, wheezing or hemoptysis.  CARDIOVASCULAR: No chest pain, orthopnea, edema.  GASTROINTESTINAL: No nausea, vomiting, diarrhea or abdominal pain.  GENITOURINARY: No dysuria, hematuria.  ENDOCRINE: No polyuria, nocturia,  HEMATOLOGY: No anemia, easy bruising or bleeding SKIN: No rash or lesion. MUSCULOSKELETAL: No joint pain or arthritis.   NEUROLOGIC: No tingling, numbness, weakness.  PSYCHIATRY: No anxiety or depression.   ROS  DRUG ALLERGIES:   Allergies  Allergen Reactions  . Morphine And Related Nausea And Vomiting  . Tramadol Nausea And Vomiting    VITALS:  Blood pressure 121/61, pulse (!) 58, temperature 97.6 F (36.4 C), temperature source Oral, resp. rate 20, height 6' (1.829 m), weight 92.3 kg, SpO2 94 %.  PHYSICAL EXAMINATION:   GENERAL:  84 y.o.-year-old patient lying in the bed with no acute distress.  EYES: Pupils equal, round, reactive to light and accommodation. No scleral icterus. Extraocular muscles intact.  HEENT: Head atraumatic, normocephalic. Oropharynx and nasopharynx clear.  NECK:  Supple, no jugular venous distention. No thyroid enlargement, no tenderness.  LUNGS: Normal breath sounds bilaterally, no wheezing, rales,rhonchi or crepitation. No use of accessory muscles of respiration.  CARDIOVASCULAR: S1, S2 normal. No murmurs, rubs, or gallops.  ABDOMEN: Soft,  nontender, nondistended. Bowel sounds present. No organomegaly or mass.  EXTREMITIES: No pedal edema, cyanosis, or clubbing.  NEUROLOGIC: Cranial nerves II through XII are intact. Muscle strength 4/5 in all extremities. Sensation intact. Gait not checked.  PSYCHIATRIC: The patient is alert and oriented x 3.  SKIN: Positive for rash, no lesion, or ulcer.   Physical Exam LABORATORY PANEL:   CBC Recent Labs  Lab 03/28/18 0446  WBC 8.2  HGB 11.4*  HCT 34.8*  PLT 153   ------------------------------------------------------------------------------------------------------------------  Chemistries  Recent Labs  Lab 03/29/18 1040 03/30/18 0411  NA 137 139  K 5.2* 4.4  CL 113* 115*  CO2 18* 18*  GLUCOSE 109* 88  BUN 24* 23  CREATININE 1.50* 1.39*  CALCIUM 9.5 8.9  MG 1.8  --    ------------------------------------------------------------------------------------------------------------------  Cardiac Enzymes Recent Labs  Lab 03/27/18 1417  TROPONINI <0.03   ------------------------------------------------------------------------------------------------------------------  RADIOLOGY:  No results found.  ASSESSMENT AND PLAN:   Active Problems:   ARF (acute renal failure) (HCC)  * Acute renal failure and hyperkalemia. Hold Bactrim and losartan, start normal saline IV, follow-up BMP.  recent stent. Cont flomax and follow with urology as planned.  * UTI   Recent sent, cont rocephin, follow urine cx.  Change to ampicilline.  * Nephrolithiasis, status posterior lithotripsy and left ureteral stent exchange.  No procedure indication per urologist. Follow in clinic as advised.  * Hypertension.  Continue Norvasc but hold losartan due to acute renal failure.  * TIA.  Continue aspirin and Lipitor.  Pt said, he had ran out of flomax prescriptions, Will resume and follow with Urology.  All the records are reviewed  and case discussed with Care Management/Social  Workerr. Management plans discussed with the patient, family and they are in agreement.  CODE STATUS: Full.  TOTAL TIME TAKING CARE OF THIS PATIENT: 35 minutes.    POSSIBLE D/C IN 1-2 DAYS, DEPENDING ON CLINICAL CONDITION.   Vaughan Basta M.D on 03/30/2018   Between 7am to 6pm - Pager - [phone]  After 6pm go to [email or web address] - password [email or web address] [facility]  Office  [phone]  CC: Primary care physician; Dion Body, MD  Note: This dictation was prepared with Dragon dictation along with smaller phrase technology. Any transcriptional errors that result from this process are unintentional.

## 2018-03-30 NOTE — Evaluation (Signed)
Physical Therapy Evaluation Patient Details Name: Robert [name] MRN: [id number] DOB: 12/02/1920 Today's Date: 03/30/2018   History of Present Illness  Pt is a 84 y/o Male who was admitted to [facility] from his doctor's office due to elevated potassium level.  Pt s/p lithotripsy and L ureteral stent exchange 4 days PTA.  Pt found to have a UTI.  Pt's PMH includes TIA.    Clinical Impression  Pt admitted with above diagnosis. Pt currently with functional limitations due to the deficits listed below (see PT Problem List). Robert [name] appears to be close to his baseline level of mobility.  He demonstrates some difficulty managing RW as he is used to using a rollator.  He demonstrated mild instability when ambulating but no LOB.  Pt will benefit from skilled PT to increase their independence and safety with mobility to allow discharge to the venue listed below.      Follow Up Recommendations Home health PT;Supervision for mobility/OOB    Equipment Recommendations  None recommended by PT    Recommendations for Other Services       Precautions / Restrictions Precautions Precautions: Fall Restrictions Weight Bearing Restrictions: No      Mobility  Bed Mobility Overal bed mobility: Needs Assistance Bed Mobility: Sit to Supine     Supine to sit: Min guard     General bed mobility comments: Increased effort and time but no physical assist required  Transfers Overall transfer level: Needs assistance Equipment used: Rolling walker (2 wheeled) Transfers: Sit to/from Stand Sit to Stand: Min guard         General transfer comment: Cues for safety, and hand placement  Ambulation/Gait Ambulation/Gait assistance: Min guard Gait Distance (Feet): 40 Feet Assistive device: Rolling walker (2 wheeled) Gait Pattern/deviations: Decreased step length - right;Decreased step length - left;Trunk flexed     General Gait Details: Cues to ambulate inside BOS of RW and to not push too far ahead.    Stairs            Wheelchair Mobility    Modified Rankin (Stroke Patients Only)       Balance Overall balance assessment: Needs assistance;History of Falls Sitting-balance support: No upper extremity supported;Feet supported Sitting balance-Leahy Scale: Good     Standing balance support: Single extremity supported;During functional activity Standing balance-Leahy Scale: Poor Standing balance comment: Pt relies on at least 1UE support for static and dynamic activity                             Pertinent Vitals/Pain      Home Living Family/patient expects to be discharged to:: Private residence([place]) Living Arrangements: Spouse/significant other Available Help at Discharge: Family;Available 24 hours/day Type of Home: House Home Access: Level entry     Home Layout: One level Home Equipment: Shower seat;Grab bars - tub/shower;Hand held shower head;Walker - 4 wheels;Cane - single point;Bedside commode      Prior Function Level of Independence: Needs assistance   Gait / Transfers Assistance Needed: Pt ambulates with rollator.    ADL's / Homemaking Assistance Needed: Pt. required assist with ADLS, IADLs, shower transfers, drying off, and LE dressing  Comments: Wife assist pt. with walking to the [facility] beauty shop1x a week. Eats breakfast out.     Hand Dominance        Extremity/Trunk Assessment   Upper Extremity Assessment Upper Extremity Assessment: Generalized weakness    Lower Extremity Assessment Lower  Extremity Assessment: Generalized weakness       Communication   Communication: No difficulties  Cognition Arousal/Alertness: Awake/alert Behavior During Therapy: WFL for tasks assessed/performed Overall Cognitive Status: Within Functional Limits for tasks assessed                                        General Comments General comments (skin integrity, edema, etc.): Wife present during evaluation     Exercises General Exercises - Lower Extremity Ankle Circles/Pumps: AROM;Both;10 reps;Supine Heel Slides: AROM;Both;10 reps;Supine   Assessment/Plan    PT Assessment Patient needs continued PT services  PT Problem List Decreased strength;Decreased activity tolerance;Decreased balance;Decreased knowledge of use of DME;Decreased safety awareness       PT Treatment Interventions DME instruction;Gait training;Functional mobility training;Therapeutic activities;Therapeutic exercise;Balance training;Neuromuscular re-education;Patient/family education    PT Goals (Current goals can be found in the Care Plan section)  Acute Rehab PT Goals Patient Stated Goal: To return home PT Goal Formulation: With patient Time For Goal Achievement: 04/13/18 Potential to Achieve Goals: Good    Frequency Min 2X/week   Barriers to discharge        Co-evaluation               AM-PAC PT "6 Clicks" Mobility  Outcome Measure Help needed turning from your back to your side while in a flat bed without using bedrails?: None Help needed moving from lying on your back to sitting on the side of a flat bed without using bedrails?: A Little Help needed moving to and from a bed to a chair (including a wheelchair)?: A Little Help needed standing up from a chair using your arms (e.g., wheelchair or bedside chair)?: A Little Help needed to walk in hospital room?: A Little Help needed climbing 3-5 steps with a railing? : A Lot 6 Click Score: 18    End of Session Equipment Utilized During Treatment: Gait belt Activity Tolerance: Patient tolerated treatment well Patient left: in chair;with call bell/phone within reach;with chair alarm set;with family/visitor present Nurse Communication: Mobility status PT Visit Diagnosis: Unsteadiness on feet (R26.81);Other abnormalities of gait and mobility (R26.89);Muscle weakness (generalized) (M62.81)    Time: 1129-1203 PT Time Calculation (min) (ACUTE ONLY): 34  min   Charges:   PT Evaluation $PT Eval Low Complexity: 1 Low PT Treatments $Gait Training: 8-22 mins        Session was performed by student PT, Belva Crome, and directed, overseen, and documented by this PT.  Collie Siad PT, DPT 03/30/2018, 3:08 PM

## 2018-03-30 NOTE — Care Management Important Message (Signed)
Important Message  Patient Details  Name: Robert [name] MRN: [id number] Date of Birth: [birth year]   Medicare Important Message Given:  Yes    Juliann Pulse A Ivelis Norgard 03/30/2018, 11:04 AM

## 2018-04-03 ENCOUNTER — Encounter: Payer: Self-pay | Admitting: Urology

## 2018-04-03 ENCOUNTER — Other Ambulatory Visit: Payer: Self-pay

## 2018-04-03 ENCOUNTER — Ambulatory Visit (INDEPENDENT_AMBULATORY_CARE_PROVIDER_SITE_OTHER): Payer: Medicare Other | Admitting: Urology

## 2018-04-03 VITALS — BP 114/52 | HR 102 | Wt 203.0 lb

## 2018-04-03 DIAGNOSIS — Z01812 Encounter for preprocedural laboratory examination: Secondary | ICD-10-CM

## 2018-04-03 DIAGNOSIS — N201 Calculus of ureter: Secondary | ICD-10-CM

## 2018-04-03 LAB — URINALYSIS, COMPLETE
Bilirubin, UA: NEGATIVE
Glucose, UA: NEGATIVE
Ketones, UA: NEGATIVE
Nitrite, UA: NEGATIVE
Specific Gravity, UA: 1.025 (ref 1.005–1.030)
Urobilinogen, Ur: 0.2 mg/dL (ref 0.2–1.0)
pH, UA: 5.5 (ref 5.0–7.5)

## 2018-04-03 LAB — MICROSCOPIC EXAMINATION: RBC, UA: >30 /hpf — ABNORMAL HIGH (ref 0–2)

## 2018-04-03 MED ORDER — CIPROFLOXACIN HCL 500 MG PO TABS
500.0000 mg | ORAL_TABLET | Freq: Once | ORAL | Status: AC
  Administered 2018-04-03: 500 mg via ORAL

## 2018-04-03 NOTE — Progress Notes (Signed)
Cystoscopy Procedure Note:  Indication: Stent removal s/p left URS/LL/stent for distal stone, unable to pass flexible scope and 1cm lower pole stone remains. Hospitalized post-op for hypokalemia, possible UTI with 60K enterococcus. Has been on culture appropriate abx, denies any fever.  After informed consent and discussion of the procedure and its risks, MARK HASSEY was positioned and prepped in the standard fashion. Cystoscopy was performed with a flexible cystoscope. The stent was grasped with flexible graspers and removed in its entirety. The patient tolerated the procedure well.  Findings: Uncomplicated stent removal  Assessment and Plan: RTC 6 months for KUB  Billey Co, MD 04/03/2018

## 2018-06-25 LAB — LIPID PANEL
Cholesterol: 133 (ref 0–200)
HDL: 58 (ref 35–70)
LDL Cholesterol: 62
Triglycerides: 65 (ref 40–160)

## 2018-06-25 LAB — BASIC METABOLIC PANEL
BUN: 27 — AB (ref 4–21)
CO2: 24 — AB (ref 13–22)
Chloride: 111 — AB (ref 99–108)
Creatinine: 1.6 — AB (ref 0.6–1.3)
Glucose: 81
Potassium: 4.1 (ref 3.4–5.3)
Sodium: 146 (ref 137–147)

## 2018-06-25 LAB — CBC AND DIFFERENTIAL
HCT: 43 (ref 41–53)
Hemoglobin: 13.7 (ref 13.5–17.5)
Platelets: 171 (ref 150–399)
WBC: 9.2

## 2018-06-25 LAB — HEPATIC FUNCTION PANEL
ALT: 10 (ref 10–40)
AST: 16 (ref 14–40)
Alkaline Phosphatase: 64 (ref 25–125)
Bilirubin, Total: 0.7

## 2018-06-25 LAB — COMPREHENSIVE METABOLIC PANEL: Calcium: 9.4 (ref 8.7–10.7)

## 2018-10-03 ENCOUNTER — Ambulatory Visit: Payer: Medicare Other | Admitting: Urology

## 2018-11-12 LAB — CBC AND DIFFERENTIAL
HCT: 43 (ref 41–53)
Hemoglobin: 14.1 (ref 13.5–17.5)
Platelets: 170 (ref 150–399)
WBC: 9.4

## 2018-11-12 LAB — BASIC METABOLIC PANEL
BUN: 27 — AB (ref 4–21)
CO2: 25 — AB (ref 13–22)
Chloride: 112 — AB (ref 99–108)
Creatinine: 1.3 (ref 0.6–1.3)
Glucose: 85
Potassium: 4.4 (ref 3.4–5.3)
Sodium: 146 (ref 137–147)

## 2018-11-12 LAB — LIPID PANEL
Cholesterol: 147 (ref 0–200)
HDL: 65 (ref 35–70)
LDL Cholesterol: 71
Triglycerides: 51 (ref 40–160)

## 2018-11-12 LAB — COMPREHENSIVE METABOLIC PANEL WITH GFR: Calcium: 9 (ref 8.7–10.7)

## 2018-11-12 LAB — HEPATIC FUNCTION PANEL
ALT: 16 (ref 10–40)
AST: 24 (ref 14–40)
Alkaline Phosphatase: 77 (ref 25–125)
Bilirubin, Total: 0.6

## 2018-11-12 LAB — CBC: RBC: 4.4 (ref 3.87–5.11)

## 2018-12-31 ENCOUNTER — Ambulatory Visit: Payer: Medicare Other | Admitting: Urology

## 2019-01-25 ENCOUNTER — Other Ambulatory Visit: Payer: Self-pay

## 2019-01-25 DIAGNOSIS — N201 Calculus of ureter: Secondary | ICD-10-CM

## 2019-01-28 ENCOUNTER — Other Ambulatory Visit: Payer: Self-pay

## 2019-01-28 ENCOUNTER — Encounter: Payer: Self-pay | Admitting: Urology

## 2019-01-28 ENCOUNTER — Ambulatory Visit
Admission: RE | Admit: 2019-01-28 | Discharge: 2019-01-28 | Disposition: A | Discharge status: Home / Self Care | Payer: Medicare Other | Source: Ambulatory Visit | Attending: Urology | Admitting: Urology

## 2019-01-28 ENCOUNTER — Ambulatory Visit (INDEPENDENT_AMBULATORY_CARE_PROVIDER_SITE_OTHER): Payer: Medicare Other | Admitting: Urology

## 2019-01-28 VITALS — BP 127/84 | HR 76 | Ht 72.0 in | Wt 198.0 lb

## 2019-01-28 DIAGNOSIS — N201 Calculus of ureter: Secondary | ICD-10-CM | POA: Insufficient documentation

## 2019-01-28 DIAGNOSIS — N200 Calculus of kidney: Secondary | ICD-10-CM

## 2019-01-28 DIAGNOSIS — R351 Nocturia: Secondary | ICD-10-CM

## 2019-01-28 NOTE — Progress Notes (Signed)
   01/28/2019 12:49 PM   Robert [name] Feb 11, 1921 [id number]  Reason for visit: Follow up nephrolithiasis  HPI: I saw Robert [name] back in urology clinic today for follow-up of nephrolithiasis.  He is a 85 year old male here with his wife who previously underwent left ureteral stent placement in November 2019 for an obstructed and infected 5 mm left distal ureteral stone.  He ultimately underwent definitive management with left ureteroscopy, laser lithotripsy on 03/23/2018.  He reports he is done well since that time and denies any gross hematuria, flank pain, recurrent UTIs, or stone episodes.    KUB today does not show any evidence of nephrolithiasis.  He has mild urinary symptoms at baseline of nocturia 1-3 times per night.   ROS: Please see flowsheet from today's date for complete review of systems.  Physical Exam: BP 127/84   Pulse 76   Ht 6' (1.829 m)   Wt 198 lb (89.8 kg)   BMI 26.85 kg/m    Constitutional: Elderly, frail-appearing Respiratory: Normal respiratory effort, no increased work of breathing. GI: Abdomen is soft, nontender, nondistended, no abdominal masses Skin: No rashes, bruises or suspicious lesions. Neurologic: Grossly intact, no focal deficits, moving all 4 extremities. Psychiatric: Normal mood and affect  Assessment & Plan:   In summary, he is a 85 year old male with a history of nephrolithiasis requiring ureteroscopy, as well as some mild urinary symptoms of nocturia 1-3 times per night.  We discussed that nocturia 1-2 times per night is normal with his age, and we discussed behavioral strategies including minimizing fluids in the evening, and double voiding prior to bed.  He would like to follow-up on an as-needed basis which is very reasonable.  We discussed return precautions including flank pain, gross hematuria, UTIs, or difficulty with urination   A total of 15 minutes were spent face-to-face with the patient, greater than 50% was spent in patient  education, counseling, and coordination of care regarding nephrolithiasis and nocturia.  Robert [name], [facility] [address] [place], [place] [address] [phone]

## 2019-01-28 NOTE — Patient Instructions (Signed)
Dietary Guidelines to Help Prevent Kidney Stones Kidney stones are deposits of minerals and salts that form inside your kidneys. Your risk of developing kidney stones may be greater depending on your diet, your lifestyle, the medicines you take, and whether you have certain medical conditions. Most people can reduce their chances of developing kidney stones by following the instructions below. Depending on your overall health and the type of kidney stones you tend to develop, your dietitian may give you more specific instructions. What are tips for following this plan? Reading food labels  Choose foods with "no salt added" or "low-salt" labels. Limit your sodium intake to less than 1500 mg per day.  Choose foods with calcium for each meal and snack. Try to eat about 300 mg of calcium at each meal. Foods that contain 200-500 mg of calcium per serving include: ? 8 oz (237 ml) of milk, fortified nondairy milk, and fortified fruit juice. ? 8 oz (237 ml) of kefir, yogurt, and soy yogurt. ? 4 oz (118 ml) of tofu. ? 1 oz of cheese. ? 1 cup (300 g) of dried figs. ? 1 cup (91 g) of cooked broccoli. ? 1-3 oz can of sardines or mackerel.  Most people need 1000 to 1500 mg of calcium each day. Talk to your dietitian about how much calcium is recommended for you. Shopping  Buy plenty of fresh fruits and vegetables. Most people do not need to avoid fruits and vegetables, even if they contain nutrients that may contribute to kidney stones.  When shopping for convenience foods, choose: ? Whole pieces of fruit. ? Premade salads with dressing on the side. ? Low-fat fruit and yogurt smoothies.  Avoid buying frozen meals or prepared deli foods.  Look for foods with live cultures, such as yogurt and kefir. Cooking  Do not add salt to food when cooking. Place a salt shaker on the table and allow each person to add his or her own salt to taste.  Use vegetable protein, such as beans, textured vegetable  protein (TVP), or tofu instead of meat in pasta, casseroles, and soups. Meal planning   Eat less salt, if told by your dietitian. To do this: ? Avoid eating processed or premade food. ? Avoid eating fast food.  Eat less animal protein, including cheese, meat, poultry, or fish, if told by your dietitian. To do this: ? Limit the number of times you have meat, poultry, fish, or cheese each week. Eat a diet free of meat at least 2 days a week. ? Eat only one serving each day of meat, poultry, fish, or seafood. ? When you prepare animal protein, cut pieces into small portion sizes. For most meat and fish, one serving is about the size of one deck of cards.  Eat at least 5 servings of fresh fruits and vegetables each day. To do this: ? Keep fruits and vegetables on hand for snacks. ? Eat 1 piece of fruit or a handful of berries with breakfast. ? Have a salad and fruit at lunch. ? Have two kinds of vegetables at dinner.  Limit foods that are high in a substance called oxalate. These include: ? Spinach. ? Rhubarb. ? Beets. ? Potato chips and french fries. ? Nuts.  If you regularly take a diuretic medicine, make sure to eat at least 1-2 fruits or vegetables high in potassium each day. These include: ? Avocado. ? Banana. ? Orange, prune, carrot, or tomato juice. ? Baked potato. ? Cabbage. ? Beans and split   peas. General instructions   Drink enough fluid to keep your urine clear or pale yellow. This is the most important thing you can do.  Talk to your health care provider and dietitian about taking daily supplements. Depending on your health and the cause of your kidney stones, you may be advised: ? Not to take supplements with vitamin C. ? To take a calcium supplement. ? To take a daily probiotic supplement. ? To take other supplements such as magnesium, fish oil, or vitamin B6.  Take all medicines and supplements as told by your health care provider.  Limit alcohol intake to no  more than 1 drink a day for nonpregnant women and 2 drinks a day for men. One drink equals 12 oz of beer, 5 oz of wine, or 1 oz of hard liquor.  Lose weight if told by your health care provider. Work with your dietitian to find strategies and an eating plan that works best for you. What foods are not recommended? Limit your intake of the following foods, or as told by your dietitian. Talk to your dietitian about specific foods you should avoid based on the type of kidney stones and your overall health. Grains Breads. Bagels. Rolls. Baked goods. Salted crackers. Cereal. Pasta. Vegetables Spinach. Rhubarb. Beets. Canned vegetables. Pickles. Olives. Meats and other protein foods Nuts. Nut butters. Large portions of meat, poultry, or fish. Salted or cured meats. Deli meats. Hot dogs. Sausages. Dairy Cheese. Beverages Regular soft drinks. Regular vegetable juice. Seasonings and other foods Seasoning blends with salt. Salad dressings. Canned soups. Soy sauce. Ketchup. Barbecue sauce. Canned pasta sauce. Casseroles. Pizza. Lasagna. Frozen meals. Potato chips. French fries. Summary  You can reduce your risk of kidney stones by making changes to your diet.  The most important thing you can do is drink enough fluid. You should drink enough fluid to keep your urine clear or pale yellow.  Ask your health care provider or dietitian how much protein from animal sources you should eat each day, and also how much salt and calcium you should have each day. This information is not intended to replace advice given to you by your health care provider. Make sure you discuss any questions you have with your health care provider. Document Released: 07/30/2010 Document Revised: 07/25/2018 Document Reviewed: 03/15/2016 Elsevier Patient Education  2020 [employer]  

## 2019-04-23 ENCOUNTER — Telehealth: Payer: Self-pay | Admitting: Adult Health Nurse Practitioner

## 2019-04-23 NOTE — Telephone Encounter (Signed)
Spoke with patient and wife regarding Palliative services and both were in agreement with this.  I have scheduled a Telephone Consult for 05/07/19 @ 11 AM.

## 2019-05-07 ENCOUNTER — Other Ambulatory Visit: Payer: Self-pay

## 2019-05-07 ENCOUNTER — Telehealth: Payer: Self-pay

## 2019-05-07 ENCOUNTER — Other Ambulatory Visit: Payer: Medicare PPO | Admitting: Adult Health Nurse Practitioner

## 2019-05-07 DIAGNOSIS — Z8673 Personal history of transient ischemic attack (TIA), and cerebral infarction without residual deficits: Secondary | ICD-10-CM

## 2019-05-07 DIAGNOSIS — Z515 Encounter for palliative care: Secondary | ICD-10-CM

## 2019-05-07 NOTE — Progress Notes (Signed)
[facility] Consult Note Telephone: [phone]  Fax: [phone]  PATIENT NAME: Robert [name] DOB: [birth year] MRN: [id number]  PRIMARY CARE PROVIDER:   Dion Body, MD  REFERRING PROVIDER:  Dion Body, MD [facility] [place],  [place] [address]  RESPONSIBLE PARTY:   Kinzer Horney, wife [phone]    RECOMMENDATIONS and PLAN:  1.  Advanced care planning.  Patient is full code.  Did not have ACP conversation today as we ran out of time discussing other ongoing issues.   2.  Pain.  Patient has had been lower back and hip pain for 3 weeks.  Patient had a fall 3 weeks ago and that is when the pain started.  He does state that the pain is 7/10. Has been taking Tylenol for the pain with some relief and gets relief with heating pad.  Have also suggested he could use OTC muscle rubs for patches to help with the pain.  Patient is able to ambulate with a walker which appears to be at baseline.  He does not complain of pain with ambulation today but wife states that he is frequently using the heating pad due to the pain.  He uses a rollator for ambulation but his wife has to guide it from the front if he does not have the brakes on.  Has appointment with PCP on 06/03/2019.  Have reached out to PCP office to see if his appointment could be moved up so he could have xrays of hips, pelvis, and lumbar spine to rule out fracture.  Also recommended PT to evaluate that he is safely using his walker. He thinks he can use a cane but watching him use his walker, I have advised against this until seen by PT.  PCP office did reach back out and will reschedule a sooner visit with the patient.    3.  Functional status.  See above for ambulation.  Patient is able to go to the bathroom on his own and dress himself. Does require some assistance from his wife with bathing.  Wife states that he is losing agility in his hands and is  no longer playing his musical instruments.  He states that he will get back to playing them again.  He has grab bars in one bathroom to help when he uses the toilet.  The other bathroom has a walk in tub that makes it easier for bathing.  Have recommended PT to evaluated for home safety and any equipment that will be safer and easier for the patient to use.  Wife is also interested in a transport chair as it is getting harder for him to go out to appointments. States that she borrowed one when going to get their first COVID vaccine and that it made it easier to get him to the appointment and back home.    4.  Nutrition.  His appetite has decreased and he is not eating as much.  His baseline weight was 218 and he is now 198.  In Epic weight appears stable since August 2020 when it was 196.  He does not supplement with Boost or Ensure.  Continue to monitor for any further weight loss and make recommendations as needed.  5.  Depression.  Wife states that he has been more withdrawn and living in the past.  She feels like he is depressed.  He does not think that he is depressed.  He is having decreased appetite.  He is sleeping well.  Wife states that they did have a grandson that was murdered in October of last year. They currently have a grandson with COVID and have not seen family in several months due to [place].  With some loss of function, isolation, and loss of his grandson he has reason to be depressed.  As he denies depression/anxiety, I do not believe he would take anything for it.  Will continue to monitor for any worsening symptoms.  Palliative care will continue to monitor for symptom management/decline and make recommendations as needed.  Next appointment is in 3 weeks  I spent 60 minutes providing this consultation, including time with patient/family, chart review, provider coordination, and documentation . More than 50% of the time in this consultation was spent coordinating communication.    HISTORY OF PRESENT ILLNESS:  Robert [name] is a 86 y.o. 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 Palliative Care was asked to help address goals of care.   CODE STATUS: full code  PPS: 50% HOSPICE ELIGIBILITY/DIAGNOSIS: TBD  PHYSICAL EXAM:   General: NAD, frail appearing Extremities: no edema, no joint deformities Neurological: Weakness but otherwise nonfocal; hard of hearing   PAST MEDICAL HISTORY:  Past Medical History:  Diagnosis Date  . Aortic atherosclerosis ([place]) 03/01/2016  . Arthritis   . 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 High cholesterol   . History of kidney stones   . Hypertension   . TIA (transient ischemic attack)   . 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 Smoking status: Former Smoker    Types: Pipe  . Smokeless tobacco: Never Used  Substance Use Topics  . Alcohol use: Yes    Alcohol/week: 7.0 standard drinks    Types: 7 Glasses of wine per week    ALLERGIES:  Allergies  Allergen Reactions  . Morphine And Related Nausea And Vomiting  . Tramadol Nausea And Vomiting  . Sulfamethoxazole-Trimethoprim Rash     PERTINENT MEDICATIONS:  Outpatient Encounter Medications as of 05/07/2019  Medication Sig  . amLODipine (NORVASC) 5 MG tablet Take 5 mg by mouth daily.  Marland Kitchen aspirin EC 325 MG tablet Take 325 mg by mouth daily.   Marland Kitchen atorvastatin (LIPITOR) 10 MG tablet Take 10 mg by mouth at bedtime.   Marland Kitchen nystatin (MYCOSTATIN/NYSTOP) powder Apply topically 4 (four) times daily as needed.  . Omega-3 Fatty Acids (FISH OIL PO) Take 1 capsule by mouth daily.  Marland Kitchen senna (SENOKOT) 8.6 MG TABS tablet Take 1 tablet (8.6 mg total) by mouth 2 (two) times daily.   No facility-administered encounter medications on file as of 05/07/2019.     Sanay Belmar Jenetta Downer, NP

## 2019-05-07 NOTE — Telephone Encounter (Signed)
Phone call placed to PCP office to provide update as follows: Patient had a fall approximately 3 weeks ago and is having  pain in lower back and hip.Patient has been using tylenol and heating pad but this has not been effective. Appointment is scheduled with PCP on 06/03/19. Message left to request moving appointment up and also for PT to eval and treat as indicated for appropriate equipment and to maintain safety.

## 2019-05-29 ENCOUNTER — Other Ambulatory Visit: Payer: Self-pay

## 2019-05-29 ENCOUNTER — Non-Acute Institutional Stay: Payer: Medicare PPO | Admitting: Adult Health Nurse Practitioner

## 2019-05-29 DIAGNOSIS — Z8673 Personal history of transient ischemic attack (TIA), and cerebral infarction without residual deficits: Secondary | ICD-10-CM

## 2019-05-29 DIAGNOSIS — Z515 Encounter for palliative care: Secondary | ICD-10-CM

## 2019-05-29 NOTE — Progress Notes (Signed)
[facility] Consult Note Telephone: [phone]  Fax: [phone]  PATIENT NAME: Robert [name] DOB: 1920/05/10 MRN: [id number]  PRIMARY CARE PROVIDER:   Dion Body, MD  REFERRING PROVIDER:  Dion Body, MD [facility] [place],  [place] [address]  RESPONSIBLE PARTY:   Ha Krengel, wife [phone]    RECOMMENDATIONS and PLAN:  1.  Advanced care planning.  Patient is a full code.  Did discuss this today and patient endorses that he would like to have everything done that can be done to preserve life.  He and his wife have living wills and their daughter, Claudie Fisherman, is named as Economist.  Encouraged to make sure PCP has copy of the living will and to call if wanting to make any changes.  2.  Pain.  Patient is still having pain in lower back and sometimes in legs.  Xrays showed no fractures but did show arthritic changes and osteopenia.  Does get relief with heating pad and Tylenol.  Only uses Tylenol at night but also advised that can use during the day as well.  Wife did state that he had redness on his back due to using heating pad too long.  Did advise that best to only use at 20 minutes at a time and can place towel between heating pad and the skin.  Patient is working with PT and OT is going to start working with him.  PT has suggested home health aide to come in and help with giving patient a bath and helping with dressing.  Wife currently helps him with these activities.  Patient wants to continue with his wife helping but does not mind someone coming in to help with cleaning and preparing meals.  Patient and wife are discussing what will work best for them.   Overall patient is doing well with no new concerns.  Denies any increased SOB or cough, fever, N/V/D, constipation.   Palliative care will continue to monitor for symptom management/decline and make recommendations as needed.  Will call in 8  weeks to see if visit is needed.   I spent 50 minutes providing this consultation,  including time with patient/family, chart review, provider coordination, and documentation. More than 50% of the time in this consultation was spent coordinating communication.   HISTORY OF PRESENT ILLNESS:  Robert [name] is a 86 y.o. 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 Palliative Care was asked to help address goals of care.   CODE STATUS: Full code  PPS: 50% HOSPICE ELIGIBILITY/DIAGNOSIS: TBD  PHYSICAL EXAM:   General: NAD, frail appearing Extremities: no edema, no joint deformities Neurological: Weakness but otherwise nonfocal;hard of hearing  PAST MEDICAL HISTORY:  Past Medical History:  Diagnosis Date  . Aortic atherosclerosis ([place]) 03/01/2016  . Arthritis   . 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 High cholesterol   . History of kidney stones   . Hypertension   . TIA (transient ischemic attack)   . 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 Smoking status: Former Smoker    Types: Pipe  . Smokeless tobacco: Never Used  Substance Use Topics  . Alcohol use: Yes    Alcohol/week: 7.0 standard drinks    Types: 7 Glasses of wine per week    ALLERGIES:  Allergies  Allergen Reactions  . Morphine And Related Nausea And Vomiting  . Tramadol Nausea And Vomiting  . Sulfamethoxazole-Trimethoprim Rash     PERTINENT MEDICATIONS:  Outpatient Encounter Medications as of 05/29/2019  Medication Sig  . amLODipine (NORVASC) 5 MG tablet Take 5 mg by mouth daily.  Marland Kitchen aspirin EC 325 MG tablet Take 325 mg by mouth daily.   Marland Kitchen atorvastatin (LIPITOR) 10 MG tablet Take 10 mg by mouth at bedtime.   Marland Kitchen nystatin (MYCOSTATIN/NYSTOP) powder Apply topically 4 (four) times daily as needed.  . Omega-3 Fatty Acids (FISH OIL PO) Take 1 capsule by mouth daily.  Marland Kitchen senna (SENOKOT) 8.6 MG TABS  tablet Take 1 tablet (8.6 mg total) by mouth 2 (two) times daily.   No facility-administered encounter medications on file as of 05/29/2019.      Yitta Gongaware Jenetta Downer, NP

## 2019-06-21 ENCOUNTER — Emergency Department: Payer: Medicare PPO

## 2019-06-21 ENCOUNTER — Inpatient Hospital Stay
Admission: EM | Admit: 2019-06-21 | Discharge: 2019-06-28 | DRG: 853 | Disposition: A | Discharge status: Skilled Nursing Facility | Payer: Medicare PPO | Attending: Internal Medicine | Admitting: Internal Medicine

## 2019-06-21 ENCOUNTER — Inpatient Hospital Stay: Payer: Medicare PPO | Admitting: Anesthesiology

## 2019-06-21 ENCOUNTER — Encounter
Admission: EM | Disposition: A | Discharge status: Skilled Nursing Facility | Payer: Self-pay | Source: Home / Self Care | Attending: Internal Medicine

## 2019-06-21 ENCOUNTER — Other Ambulatory Visit: Payer: Self-pay

## 2019-06-21 DIAGNOSIS — Z1611 Resistance to penicillins: Secondary | ICD-10-CM | POA: Diagnosis present

## 2019-06-21 DIAGNOSIS — N390 Urinary tract infection, site not specified: Secondary | ICD-10-CM | POA: Diagnosis present

## 2019-06-21 DIAGNOSIS — E8770 Fluid overload, unspecified: Secondary | ICD-10-CM | POA: Diagnosis present

## 2019-06-21 DIAGNOSIS — A419 Sepsis, unspecified organism: Secondary | ICD-10-CM | POA: Diagnosis not present

## 2019-06-21 DIAGNOSIS — J690 Pneumonitis due to inhalation of food and vomit: Secondary | ICD-10-CM | POA: Diagnosis present

## 2019-06-21 DIAGNOSIS — N3289 Other specified disorders of bladder: Secondary | ICD-10-CM | POA: Diagnosis present

## 2019-06-21 DIAGNOSIS — Z87891 Personal history of nicotine dependence: Secondary | ICD-10-CM

## 2019-06-21 DIAGNOSIS — J9601 Acute respiratory failure with hypoxia: Secondary | ICD-10-CM | POA: Diagnosis present

## 2019-06-21 DIAGNOSIS — Z20822 Contact with and (suspected) exposure to covid-19: Secondary | ICD-10-CM | POA: Diagnosis present

## 2019-06-21 DIAGNOSIS — Z1623 Resistance to quinolones and fluoroquinolones: Secondary | ICD-10-CM | POA: Diagnosis present

## 2019-06-21 DIAGNOSIS — Z8673 Personal history of transient ischemic attack (TIA), and cerebral infarction without residual deficits: Secondary | ICD-10-CM

## 2019-06-21 DIAGNOSIS — N201 Calculus of ureter: Secondary | ICD-10-CM

## 2019-06-21 DIAGNOSIS — E876 Hypokalemia: Secondary | ICD-10-CM | POA: Diagnosis present

## 2019-06-21 DIAGNOSIS — Z888 Allergy status to other drugs, medicaments and biological substances status: Secondary | ICD-10-CM

## 2019-06-21 DIAGNOSIS — L8990 Pressure ulcer of unspecified site, unspecified stage: Secondary | ICD-10-CM | POA: Insufficient documentation

## 2019-06-21 DIAGNOSIS — I10 Essential (primary) hypertension: Secondary | ICD-10-CM | POA: Diagnosis present

## 2019-06-21 DIAGNOSIS — E7800 Pure hypercholesterolemia, unspecified: Secondary | ICD-10-CM | POA: Diagnosis present

## 2019-06-21 DIAGNOSIS — N136 Pyonephrosis: Secondary | ICD-10-CM | POA: Diagnosis present

## 2019-06-21 DIAGNOSIS — Z885 Allergy status to narcotic agent status: Secondary | ICD-10-CM | POA: Diagnosis not present

## 2019-06-21 DIAGNOSIS — D696 Thrombocytopenia, unspecified: Secondary | ICD-10-CM | POA: Diagnosis present

## 2019-06-21 DIAGNOSIS — N170 Acute kidney failure with tubular necrosis: Secondary | ICD-10-CM | POA: Diagnosis present

## 2019-06-21 DIAGNOSIS — R1310 Dysphagia, unspecified: Secondary | ICD-10-CM | POA: Diagnosis present

## 2019-06-21 DIAGNOSIS — R0602 Shortness of breath: Secondary | ICD-10-CM

## 2019-06-21 DIAGNOSIS — A4151 Sepsis due to Escherichia coli [E. coli]: Principal | ICD-10-CM | POA: Diagnosis present

## 2019-06-21 DIAGNOSIS — Z7189 Other specified counseling: Secondary | ICD-10-CM | POA: Diagnosis not present

## 2019-06-21 DIAGNOSIS — L89151 Pressure ulcer of sacral region, stage 1: Secondary | ICD-10-CM | POA: Clinically undetermined

## 2019-06-21 DIAGNOSIS — Z882 Allergy status to sulfonamides status: Secondary | ICD-10-CM

## 2019-06-21 DIAGNOSIS — Z9981 Dependence on supplemental oxygen: Secondary | ICD-10-CM | POA: Diagnosis not present

## 2019-06-21 DIAGNOSIS — H547 Unspecified visual loss: Secondary | ICD-10-CM | POA: Diagnosis present

## 2019-06-21 DIAGNOSIS — R630 Anorexia: Secondary | ICD-10-CM | POA: Diagnosis present

## 2019-06-21 DIAGNOSIS — Z96643 Presence of artificial hip joint, bilateral: Secondary | ICD-10-CM | POA: Diagnosis present

## 2019-06-21 DIAGNOSIS — J810 Acute pulmonary edema: Secondary | ICD-10-CM | POA: Diagnosis not present

## 2019-06-21 DIAGNOSIS — R4182 Altered mental status, unspecified: Secondary | ICD-10-CM

## 2019-06-21 DIAGNOSIS — Z79899 Other long term (current) drug therapy: Secondary | ICD-10-CM

## 2019-06-21 DIAGNOSIS — G9341 Metabolic encephalopathy: Secondary | ICD-10-CM | POA: Diagnosis present

## 2019-06-21 DIAGNOSIS — N132 Hydronephrosis with renal and ureteral calculous obstruction: Secondary | ICD-10-CM

## 2019-06-21 DIAGNOSIS — R7881 Bacteremia: Secondary | ICD-10-CM | POA: Diagnosis present

## 2019-06-21 DIAGNOSIS — E870 Hyperosmolality and hypernatremia: Secondary | ICD-10-CM | POA: Diagnosis not present

## 2019-06-21 DIAGNOSIS — R0902 Hypoxemia: Secondary | ICD-10-CM

## 2019-06-21 DIAGNOSIS — B9620 Unspecified Escherichia coli [E. coli] as the cause of diseases classified elsewhere: Secondary | ICD-10-CM | POA: Diagnosis present

## 2019-06-21 DIAGNOSIS — N200 Calculus of kidney: Secondary | ICD-10-CM | POA: Diagnosis present

## 2019-06-21 DIAGNOSIS — Z515 Encounter for palliative care: Secondary | ICD-10-CM | POA: Diagnosis not present

## 2019-06-21 DIAGNOSIS — R6520 Severe sepsis without septic shock: Secondary | ICD-10-CM | POA: Diagnosis not present

## 2019-06-21 HISTORY — PX: CYSTOSCOPY WITH STENT PLACEMENT: SHX5790

## 2019-06-21 HISTORY — PX: CYSTOSCOPY W/ RETROGRADES: SHX1426

## 2019-06-21 LAB — URINALYSIS, COMPLETE (UACMP) WITH MICROSCOPIC
Bilirubin Urine: NEGATIVE
Glucose, UA: NEGATIVE mg/dL
Ketones, ur: 15 mg/dL — AB
Nitrite: POSITIVE — AB
Protein, ur: 30 mg/dL — AB
Specific Gravity, Urine: 1.02 (ref 1.005–1.030)
pH: 5 (ref 5.0–8.0)

## 2019-06-21 LAB — LACTIC ACID, PLASMA
Lactic Acid, Venous: 2.9 mmol/L (ref 0.5–1.9)
Lactic Acid, Venous: 2.4 mmol/L (ref 0.5–1.9)

## 2019-06-21 LAB — COMPREHENSIVE METABOLIC PANEL
ALT: 16 U/L (ref 0–44)
AST: 38 U/L (ref 15–41)
Albumin: 3.6 g/dL (ref 3.5–5.0)
Alkaline Phosphatase: 57 U/L (ref 38–126)
Anion gap: 10 (ref 5–15)
BUN: 28 mg/dL — ABNORMAL HIGH (ref 8–23)
CO2: 20 mmol/L — ABNORMAL LOW (ref 22–32)
Calcium: 9 mg/dL (ref 8.9–10.3)
Chloride: 112 mmol/L — ABNORMAL HIGH (ref 98–111)
Creatinine, Ser: 1.54 mg/dL — ABNORMAL HIGH (ref 0.61–1.24)
GFR calc Af Amer: 43 mL/min — ABNORMAL LOW (ref >60)
GFR calc non Af Amer: 38 mL/min — ABNORMAL LOW (ref >60)
Glucose, Bld: 101 mg/dL — ABNORMAL HIGH (ref 70–99)
Potassium: 4.2 mmol/L (ref 3.5–5.1)
Sodium: 142 mmol/L (ref 135–145)
Total Bilirubin: 1.3 mg/dL — ABNORMAL HIGH (ref 0.3–1.2)
Total Protein: 6.3 g/dL — ABNORMAL LOW (ref 6.5–8.1)

## 2019-06-21 LAB — BLOOD CULTURE ID PANEL (REFLEXED)

## 2019-06-21 LAB — CBC WITH DIFFERENTIAL/PLATELET
Abs Immature Granulocytes: 0.18 10*3/uL — ABNORMAL HIGH (ref 0.00–0.07)
Basophils Absolute: 0.1 10*3/uL (ref 0.0–0.1)
Basophils Relative: 0 %
Eosinophils Absolute: 0.1 10*3/uL (ref 0.0–0.5)
Eosinophils Relative: 1 %
HCT: 38.1 % — ABNORMAL LOW (ref 39.0–52.0)
Hemoglobin: 12.6 g/dL — ABNORMAL LOW (ref 13.0–17.0)
Immature Granulocytes: 1 %
Lymphocytes Relative: 1 %
Lymphs Abs: 0.3 10*3/uL — ABNORMAL LOW (ref 0.7–4.0)
MCH: 31.9 pg (ref 26.0–34.0)
MCHC: 33.1 g/dL (ref 30.0–36.0)
MCV: 96.5 fL (ref 80.0–100.0)
Monocytes Absolute: 0.7 10*3/uL (ref 0.1–1.0)
Monocytes Relative: 3 %
Neutro Abs: 20 10*3/uL — ABNORMAL HIGH (ref 1.7–7.7)
Neutrophils Relative %: 94 %
Platelets: 135 10*3/uL — ABNORMAL LOW (ref 150–400)
RBC: 3.95 MIL/uL — ABNORMAL LOW (ref 4.22–5.81)
RDW: 14.5 % (ref 11.5–15.5)
WBC: 21.3 10*3/uL — ABNORMAL HIGH (ref 4.0–10.5)
nRBC: 0 % (ref 0.0–0.2)

## 2019-06-21 LAB — POC SARS CORONAVIRUS 2 AG: SARS Coronavirus 2 Ag: NEGATIVE

## 2019-06-21 LAB — RESPIRATORY PANEL BY RT PCR (FLU A&B, COVID)
Influenza A by PCR: NEGATIVE
Influenza B by PCR: NEGATIVE
SARS Coronavirus 2 by RT PCR: NEGATIVE

## 2019-06-21 LAB — PROCALCITONIN: Procalcitonin: 38.9 ng/mL

## 2019-06-21 SURGERY — CYSTOSCOPY, WITH STENT INSERTION
Anesthesia: General | Site: Ureter | Laterality: Left

## 2019-06-21 MED ORDER — SODIUM CHLORIDE 0.9 % IV BOLUS
1000.0000 mL | Freq: Once | INTRAVENOUS | Status: AC
  Administered 2019-06-21: 1000 mL via INTRAVENOUS

## 2019-06-21 MED ORDER — OMEGA-3-ACID ETHYL ESTERS 1 G PO CAPS
2.0000 g | ORAL_CAPSULE | Freq: Every day | ORAL | Status: DC
  Administered 2019-06-21 – 2019-06-28 (×8): 2 g via ORAL
  Filled 2019-06-21 (×8): qty 2

## 2019-06-21 MED ORDER — ONDANSETRON HCL 4 MG/2ML IJ SOLN
4.0000 mg | Freq: Four times a day (QID) | INTRAMUSCULAR | Status: DC | PRN
  Administered 2019-06-21: 4 mg via INTRAVENOUS

## 2019-06-21 MED ORDER — PROPOFOL 10 MG/ML IV BOLUS
INTRAVENOUS | Status: AC
  Filled 2019-06-21: qty 20

## 2019-06-21 MED ORDER — LIDOCAINE HCL (CARDIAC) PF 100 MG/5ML IV SOSY
PREFILLED_SYRINGE | INTRAVENOUS | Status: DC | PRN
  Administered 2019-06-21: 80 mg via INTRAVENOUS

## 2019-06-21 MED ORDER — ASPIRIN EC 81 MG PO TBEC
81.0000 mg | DELAYED_RELEASE_TABLET | Freq: Every day | ORAL | Status: DC
  Administered 2019-06-21 – 2019-06-28 (×7): 81 mg via ORAL
  Filled 2019-06-21 (×7): qty 1

## 2019-06-21 MED ORDER — ROCURONIUM BROMIDE 100 MG/10ML IV SOLN
INTRAVENOUS | Status: DC | PRN
  Administered 2019-06-21: 5 mg via INTRAVENOUS

## 2019-06-21 MED ORDER — VANCOMYCIN HCL IN DEXTROSE 1-5 GM/200ML-% IV SOLN
1000.0000 mg | INTRAVENOUS | Status: AC
  Administered 2019-06-21: 1000 mg via INTRAVENOUS
  Filled 2019-06-21: qty 200

## 2019-06-21 MED ORDER — SODIUM CHLORIDE 0.9 % IV SOLN
1.0000 g | Freq: Two times a day (BID) | INTRAVENOUS | Status: DC
  Administered 2019-06-21 – 2019-06-24 (×6): 1 g via INTRAVENOUS
  Filled 2019-06-21 (×7): qty 1

## 2019-06-21 MED ORDER — EPHEDRINE SULFATE 50 MG/ML IJ SOLN
INTRAMUSCULAR | Status: DC | PRN
  Administered 2019-06-21 (×2): 5 mg via INTRAVENOUS

## 2019-06-21 MED ORDER — SODIUM CHLORIDE 0.9 % IV BOLUS: 500.0000 mL | Freq: Once | INTRAVENOUS | Status: DC

## 2019-06-21 MED ORDER — SENNA 8.6 MG PO TABS
1.0000 | ORAL_TABLET | Freq: Two times a day (BID) | ORAL | Status: DC
  Administered 2019-06-21 – 2019-06-28 (×15): 8.6 mg via ORAL
  Filled 2019-06-21 (×15): qty 1

## 2019-06-21 MED ORDER — FENTANYL CITRATE (PF) 100 MCG/2ML IJ SOLN
INTRAMUSCULAR | Status: AC
  Filled 2019-06-21: qty 2

## 2019-06-21 MED ORDER — FENTANYL CITRATE (PF) 100 MCG/2ML IJ SOLN: 25.0000 ug | INTRAMUSCULAR | Status: DC | PRN

## 2019-06-21 MED ORDER — ADULT MULTIVITAMIN W/MINERALS CH
1.0000 | ORAL_TABLET | Freq: Every day | ORAL | Status: DC
  Administered 2019-06-21 – 2019-06-28 (×8): 1 via ORAL
  Filled 2019-06-21 (×8): qty 1

## 2019-06-21 MED ORDER — SUGAMMADEX SODIUM 200 MG/2ML IV SOLN
INTRAVENOUS | Status: DC | PRN
  Administered 2019-06-21: 174.2 mg via INTRAVENOUS

## 2019-06-21 MED ORDER — SODIUM CHLORIDE 0.9 % IV SOLN
2.0000 g | Freq: Once | INTRAVENOUS | Status: AC
  Administered 2019-06-21: 2 g via INTRAVENOUS
  Filled 2019-06-21: qty 2

## 2019-06-21 MED ORDER — CHLORHEXIDINE GLUCONATE CLOTH 2 % EX PADS
6.0000 | MEDICATED_PAD | Freq: Every day | CUTANEOUS | Status: DC
  Administered 2019-06-22 – 2019-06-26 (×4): 6 via TOPICAL

## 2019-06-21 MED ORDER — SODIUM CHLORIDE 0.9 % IV SOLN
2.0000 g | INTRAVENOUS | Status: DC
  Filled 2019-06-21: qty 2

## 2019-06-21 MED ORDER — PROPOFOL 10 MG/ML IV BOLUS
INTRAVENOUS | Status: DC | PRN
  Administered 2019-06-21: 140 mg via INTRAVENOUS

## 2019-06-21 MED ORDER — SODIUM CHLORIDE 0.9 % IV SOLN
1000.0000 mL | INTRAVENOUS | Status: DC
  Administered 2019-06-21 – 2019-06-22 (×5): 1000 mL via INTRAVENOUS

## 2019-06-21 MED ORDER — PHENYLEPHRINE HCL (PRESSORS) 10 MG/ML IV SOLN
INTRAVENOUS | Status: DC | PRN
  Administered 2019-06-21 (×2): 100 ug via INTRAVENOUS
  Administered 2019-06-21: 200 ug via INTRAVENOUS
  Administered 2019-06-21: 100 ug via INTRAVENOUS

## 2019-06-21 MED ORDER — LACTATED RINGERS IV SOLN: INTRAVENOUS | Status: DC | PRN

## 2019-06-21 MED ORDER — SODIUM CHLORIDE 0.9 % IV SOLN
1.0000 g | Freq: Once | INTRAVENOUS | Status: AC
  Administered 2019-06-21: 1 g via INTRAVENOUS
  Filled 2019-06-21: qty 10

## 2019-06-21 MED ORDER — SODIUM CHLORIDE 0.9% FLUSH
3.0000 mL | Freq: Two times a day (BID) | INTRAVENOUS | Status: DC
  Administered 2019-06-21 – 2019-06-28 (×14): 3 mL via INTRAVENOUS

## 2019-06-21 MED ORDER — ONDANSETRON HCL 4 MG PO TABS: 4.0000 mg | ORAL_TABLET | Freq: Four times a day (QID) | ORAL | Status: DC | PRN

## 2019-06-21 MED ORDER — ATORVASTATIN CALCIUM 10 MG PO TABS
10.0000 mg | ORAL_TABLET | Freq: Every day | ORAL | Status: DC
  Administered 2019-06-21 – 2019-06-27 (×7): 10 mg via ORAL
  Filled 2019-06-21 (×7): qty 1

## 2019-06-21 MED ORDER — SUCCINYLCHOLINE CHLORIDE 20 MG/ML IJ SOLN
INTRAMUSCULAR | Status: DC | PRN
  Administered 2019-06-21: 80 mg via INTRAVENOUS

## 2019-06-21 MED ORDER — TAMSULOSIN HCL 0.4 MG PO CAPS
0.4000 mg | ORAL_CAPSULE | Freq: Every day | ORAL | Status: DC
  Administered 2019-06-21 – 2019-06-28 (×7): 0.4 mg via ORAL
  Filled 2019-06-21 (×8): qty 1

## 2019-06-21 MED ORDER — ACETAMINOPHEN 325 MG PO TABS: 650.0000 mg | ORAL_TABLET | Freq: Four times a day (QID) | ORAL | Status: DC | PRN

## 2019-06-21 MED ORDER — ONDANSETRON HCL 4 MG/2ML IJ SOLN: 4.0000 mg | Freq: Once | INTRAMUSCULAR | Status: DC | PRN

## 2019-06-21 MED ORDER — ACETAMINOPHEN 650 MG RE SUPP: 650.0000 mg | Freq: Four times a day (QID) | RECTAL | Status: DC | PRN

## 2019-06-21 MED ORDER — VANCOMYCIN HCL 750 MG/150ML IV SOLN
750.0000 mg | Freq: Once | INTRAVENOUS | Status: AC
  Administered 2019-06-21: 750 mg via INTRAVENOUS
  Filled 2019-06-21: qty 150

## 2019-06-21 MED ORDER — IOHEXOL 180 MG/ML  SOLN
INTRAMUSCULAR | Status: DC | PRN
  Administered 2019-06-21: 13:00:00 20 mL

## 2019-06-21 MED ORDER — ACETAMINOPHEN 10 MG/ML IV SOLN
INTRAVENOUS | Status: DC | PRN
  Administered 2019-06-21: 1000 mg via INTRAVENOUS

## 2019-06-21 SURGICAL SUPPLY — 17 items
BAG DRAIN CYSTO-URO LG1000N (MISCELLANEOUS) ×3 IMPLANT
BRUSH SCRUB EZ  4% CHG (MISCELLANEOUS) ×2
BRUSH SCRUB EZ 4% CHG (MISCELLANEOUS) ×1 IMPLANT
CATH URETL 5X70 OPEN END (CATHETERS) ×3 IMPLANT
GLOVE BIO SURGEON STRL SZ8 (GLOVE) ×3 IMPLANT
GOWN STRL REUS W/ TWL XL LVL3 (GOWN DISPOSABLE) ×1 IMPLANT
GOWN STRL REUS W/TWL XL LVL3 (GOWN DISPOSABLE) ×2
GUIDEWIRE STR DUAL SENSOR (WIRE) ×3 IMPLANT
KIT TURNOVER CYSTO (KITS) ×3 IMPLANT
PACK CYSTO AR (MISCELLANEOUS) ×3 IMPLANT
SET CYSTO W/LG BORE CLAMP LF (SET/KITS/TRAYS/PACK) ×3 IMPLANT
SOL .9 NS 3000ML IRR  AL (IV SOLUTION) ×2
SOL .9 NS 3000ML IRR UROMATIC (IV SOLUTION) ×1 IMPLANT
STENT URET 6FRX24 CONTOUR (STENTS) IMPLANT
STENT URET 6FRX26 CONTOUR (STENTS) ×3 IMPLANT
SURGILUBE 2OZ TUBE FLIPTOP (MISCELLANEOUS) ×3 IMPLANT
WATER STERILE IRR 1000ML POUR (IV SOLUTION) ×3 IMPLANT

## 2019-06-21 NOTE — H&P (Addendum)
History and Physical    Robert [name] [id number] DOB: 05/14/1920 DOA: 06/21/2019  PCP: Dion Body, MD   Patient coming from: Home Most of the history was provided by the wife at the bedside  I have personally briefly reviewed patient's old medical records in [place]  Chief Complaint: Change in mental status  HPI: Robert [name] is a 86 y.o. 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  Patient was brought in this morning for evaluation of mental status changes which the wife noted 1 day prior to his admission.  She states that patient complained of not feeling well and had nausea but no vomiting.  He subsequently developed chills but no fever.  He has a history of low back pain but the wife states that the pain got worse the day prior to his admission and they thought it was exacerbated from physical therapy that he had earlier in the day.  At baseline patient is normally awake, alert and oriented x 2 but on admission he is only oriented to person.  I am unable to do a review of systems due to his ental status changes.  His wife states that he has been constipated. Patient received his second COVID 19 vaccine 3 weeks ago has not had any contact with someone who has the disease.  Labs revealed marked leukocytosis, 20K with a left shift, he has pyuria and lactate is elevated at 2.9.  CT scan renal study showed 4 mm calculus adjacent to the left ureterovesicular junction with very mild proximal left hydroureteronephrosis indicative of mild obstruction.  Other nonobstructive calculi noted as well. Chest x-ray does not show any acute findings. He will be admitted to the hospital for further evaluation.  ED Course: Patient seen in the emergency room for evaluation of mental status changes noted to be septic from a urinary source.  Patient has a history of nephrolithiasis and CT showed evidence  of a left ureteral stone with mild obstruction.  Urology has been consulted with plans to take patient for stenting today.  Patient received a dose of Rocephin and vancomycin in the emergency room.  He has remained hemodynamically stable  Review of Systems: As per HPI otherwise 10 point review of systems negative.    Past Medical History:  Diagnosis Date  . Aortic atherosclerosis ([place]) 03/01/2016  . Arthritis   . 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 High cholesterol   . History of kidney stones   . Hypertension   . TIA (transient ischemic attack)   . 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 CYSTOSCOPY W/ RETROGRADES Left 03/23/2018   Procedure: CYSTOSCOPY WITH RETROGRADE PYELOGRAM;  Surgeon: Billey Co, MD;  Location: [facility] ORS;  Service: Urology;  Laterality: Left;  . CYSTOSCOPY WITH STENT PLACEMENT Left 02/20/2018   Procedure: CYSTOSCOPY WITH STENT PLACEMENT;  Surgeon: Billey Co, MD;  Location: [facility] ORS;  Service: Urology;  Laterality: Left;  . CYSTOSCOPY/URETEROSCOPY/HOLMIUM LASER/STENT PLACEMENT Left 03/23/2018   Procedure: CYSTOSCOPY/URETEROSCOPY/HOLMIUM LASER/STENT Exchange;  Surgeon: Billey Co, MD;  Location: [facility] ORS;  Service: Urology;  Laterality: Left;  left stent exchange  . LEG SURGERY     broken tib/fib  . TOTAL HIP ARTHROPLASTY Bilateral      reports that he has quit smoking. His smoking use included pipe. He has never  used smokeless tobacco. He reports current alcohol use of about 7.0 standard drinks of alcohol per week. He reports that he does not use drugs.  Allergies  Allergen Reactions  . Morphine And Related Nausea And Vomiting  . Tramadol Nausea And Vomiting  . Sulfamethoxazole-Trimethoprim Rash    Family History  Problem Relation Age of Onset  . Prostate cancer Neg Hx   . Bladder Cancer Neg Hx   . Kidney cancer Neg Hx      Prior to Admission medications   Medication Sig Start Date End  Date Taking? Authorizing Provider  amLODipine (NORVASC) 5 MG tablet Take 5 mg by mouth daily.    [provider]  aspirin EC 325 MG tablet Take 325 mg by mouth daily.     [provider]  atorvastatin (LIPITOR) 10 MG tablet Take 10 mg by mouth at bedtime.     [provider]  nystatin (MYCOSTATIN/NYSTOP) powder Apply topically 4 (four) times daily as needed. 03/19/18   Hollice Espy, MD  Omega-3 Fatty Acids (FISH OIL PO) Take 1 capsule by mouth daily.    [provider]  senna (SENOKOT) 8.6 MG TABS tablet Take 1 tablet (8.6 mg total) by mouth 2 (two) times daily. 03/30/18   Vaughan Basta, MD    Physical Exam: Vitals:   06/21/19 0703 06/21/19 0729 06/21/19 0730 06/21/19 0830  BP: 125/73  122/62 (!) 115/55  Pulse:   89 78  Resp:   (!) 25 20  Temp:  98.4 F (36.9 C)    TempSrc:  Rectal    SpO2:   91% 94%  Weight: 82 kg     Height: 5\' 9"  (1.753 m)        Vitals:   06/21/19 0703 06/21/19 0729 06/21/19 0730 06/21/19 0830  BP: 125/73  122/62 (!) 115/55  Pulse:   89 78  Resp:   (!) 25 20  Temp:  98.4 F (36.9 C)    TempSrc:  Rectal    SpO2:   91% 94%  Weight: 82 kg     Height: 5\' 9"  (1.753 m)       Constitutional: NAD, alert and oriented only to person Eyes: PERRL, lids and conjunctivae normal ENMT: Mucous membranes are dry Neck: normal, supple, no masses, no thyromegaly Respiratory: clear to auscultation bilaterally, no wheezing, no crackles. Normal respiratory effort. No accessory muscle use.  Cardiovascular: Regular rate and rhythm, no murmurs / rubs / gallops. No extremity edema. 2+ pedal pulses. No carotid bruits.  Abdomen: no tenderness, no masses palpated. No hepatosplenomegaly. Bowel sounds positive.   Musculoskeletal: no clubbing / cyanosis. No joint deformity upper and lower extremities.  Skin: no rashes, lesions, ulcers.  Neurologic: No gross focal neurologic deficit.  Generalized weakness Psychiatric: Normal mood and  affect.   Labs on Admission: I have personally reviewed following labs and imaging studies  CBC: Recent Labs  Lab 06/21/19 0705  WBC 21.3*  NEUTROABS 20.0*  HGB 12.6*  HCT 38.1*  MCV 96.5  PLT [id number]*   Basic Metabolic Panel: Recent Labs  Lab 06/21/19 0705  NA 142  K 4.2  CL 112*  CO2 20*  GLUCOSE 101*  BUN 28*  CREATININE 1.54*  CALCIUM 9.0   GFR: Estimated Creatinine Clearance: 28.1 mL/min (A) (by C-G formula based on SCr of 1.54 mg/dL (H)). Liver Function Tests: Recent Labs  Lab 06/21/19 0705  AST 38  ALT 16  ALKPHOS 57  BILITOT 1.3*  PROT 6.3*  ALBUMIN 3.6  No results for input(s): LIPASE, AMYLASE in the last 168 hours. No results for input(s): AMMONIA in the last 168 hours. Coagulation Profile: No results for input(s): INR, PROTIME in the last 168 hours. Cardiac Enzymes: No results for input(s): CKTOTAL, CKMB, CKMBINDEX, TROPONINI in the last 168 hours. BNP (last 3 results) No results for input(s): PROBNP in the last 8760 hours. HbA1C: No results for input(s): HGBA1C in the last 72 hours. CBG: No results for input(s): GLUCAP in the last 168 hours. Lipid Profile: No results for input(s): CHOL, HDL, LDLCALC, TRIG, CHOLHDL, LDLDIRECT in the last 72 hours. Thyroid Function Tests: No results for input(s): TSH, T4TOTAL, FREET4, T3FREE, THYROIDAB in the last 72 hours. Anemia Panel: No results for input(s): VITAMINB12, FOLATE, FERRITIN, TIBC, IRON, RETICCTPCT in the last 72 hours. Urine analysis:    Component Value Date/Time   COLORURINE YELLOW 06/21/2019 0705   APPEARANCEUR HAZY (A) 06/21/2019 0705   APPEARANCEUR Cloudy (A) 04/03/2018 0915   LABSPEC 1.020 06/21/2019 0705   PHURINE 5.0 06/21/2019 0705   GLUCOSEU NEGATIVE 06/21/2019 0705   HGBUR SMALL (A) 06/21/2019 0705   BILIRUBINUR NEGATIVE 06/21/2019 0705   BILIRUBINUR Negative 04/03/2018 0915   KETONESUR 15 (A) 06/21/2019 0705   PROTEINUR 30 (A) 06/21/2019 0705   NITRITE POSITIVE (A) 06/21/2019  0705   LEUKOCYTESUR SMALL (A) 06/21/2019 0705    Radiological Exams on Admission: DG Chest Port 1 View  Result Date: 06/21/2019 CLINICAL DATA:  Altered mental status EXAM: PORTABLE CHEST 1 VIEW COMPARISON:  02/19/2018 chest radiograph. FINDINGS: Stable cardiomediastinal silhouette with normal heart size. No pneumothorax. No significant pleural effusions. No pulmonary edema. Mild bibasilar scarring versus atelectasis, unchanged. No acute consolidative airspace disease. IMPRESSION: Stable mild bibasilar scarring versus atelectasis. Electronically Signed   By: Ilona Sorrel M.D.   On: 06/21/2019 08:00   DG OR UROLOGY CYSTO IMAGE ([facility] ONLY)  Result Date: 06/21/2019 There is no interpretation for this exam.  This order is for images obtained during a surgical procedure.  Please See "Surgeries" Tab for more information regarding the procedure.   CT Renal Stone Study  Result Date: 06/21/2019 CLINICAL DATA:  86 year old male with history of left-sided flank pain. Suspected kidney stones. EXAM: CT ABDOMEN AND PELVIS WITHOUT CONTRAST TECHNIQUE: Multidetector CT imaging of the abdomen and pelvis was performed following the standard protocol without IV contrast. COMPARISON:  CT the abdomen and pelvis 02/19/2018. FINDINGS: Lower chest: Mild chronic scarring in the left lower lobe, similar to the prior study. Aortic atherosclerosis. Calcifications of the aortic valve and mitral annulus. Hepatobiliary: No definite cystic or solid hepatic lesions are confidently identified on today's noncontrast CT examination. Calcified gallstones are noted in the gallbladder measuring up to 2.2 cm in diameter. No findings to suggest an acute cholecystitis at this time. Pancreas: No definite pancreatic mass or peripancreatic fluid collections or inflammatory changes are noted on today's noncontrast CT examination. Spleen: Unremarkable. Adrenals/Urinary Tract: Nonobstructive calculi are noted within the collecting systems of both  kidneys, largest of which is in the lower pole of the right kidney measuring 1.1 cm in diameter. In addition, there is a 4 mm calculus adjacent to the left ureterovesicular junction in the distal third of the left ureter (axial image 77 of series 2). This is associated with minimal proximal left hydroureteronephrosis indicative of very mild obstruction. Unenhanced appearance of the kidneys is otherwise unremarkable. Urinary bladder is normal in appearance. Bilateral adrenal glands are normal in appearance. Stomach/Bowel: Unenhanced appearance of the stomach is normal. No pathologic  dilatation of small bowel or colon. Normal appendix. Vascular/Lymphatic: Aortic atherosclerosis. No lymphadenopathy noted in the abdomen or pelvis. Reproductive: Prostate gland and seminal vesicles are poorly visualized secondary to beam hardening artifact from the patient's bilateral hip arthroplasties. Other: No significant volume of ascites.  No pneumoperitoneum. Musculoskeletal: Status post bilateral hip arthroplasty. There are no aggressive appearing lytic or blastic lesions noted in the visualized portions of the skeleton. IMPRESSION: 1. 4 mm calculus adjacent to the left ureterovesicular junction with very mild proximal left hydroureteronephrosis indicative of very mild obstruction. 2. Other nonobstructive calculi in the collecting systems of the kidneys bilaterally measuring up to 1.1 cm in diameter in the lower pole collecting system of the right kidney. 3. Cholelithiasis without evidence of acute cholecystitis at this time. 4. Aortic atherosclerosis. 5. There are calcifications of the aortic valve and mitral annulus. Echocardiographic correlation for evaluation of potential valvular dysfunction may be warranted if clinically indicated. Electronically Signed   By: Vinnie Langton M.D.   On: 06/21/2019 09:11    EKG: Independently reviewed.  Sinus tachycardia Multiple artifacts  Assessment/Plan Principal Problem:   Sepsis  ([place]) Active Problems:   HTN (hypertension)   UTI (urinary tract infection)   Hydronephrosis with renal and ureteral calculus obstruction   Acute metabolic encephalopathy    Sepsis Present on admission and as evidenced by tachypnea, elevated lactate and white count of 21,000 from a urinary source Patient has a history of nephrolithiasis and CT showed mild left-sided hydronephrosis from an obstructing stone.  Renal function is stable and creatinine appears to be at baseline 1.5. Prior urine culture yielded E faecalis Will place patient empirically on vancomycin and cefepime while awaiting results of urine culture Follow-up results of blood culture Urology has been consulted and patient is scheduled for stent placement (06/21/19) Continue IV fluid hydration   Hydronephrosis with renal and ureteral calculus obstruction Treatment as outlined in 1 Continue Flomax   Acute metabolic encephalopathy Secondary to sepsis Expect improvement in patient's renal function following resolution of sepsis   Hypertension Hold all antihypertensive medications for now   DVT prophylaxis: SCD Code Status: Full code Family Communication:  Plan of care discussed with patient's wife was at the bedside Disposition Plan: Back to previous home environment Consults called: Urology    Collier Bullock MD [facility]     06/21/2019, 10:19 AM

## 2019-06-21 NOTE — Consult Note (Signed)
Pharmacy Antibiotic Note  Robert [name] is a 86 y.o. male admitted on 06/21/2019 with sepsis.  Pharmacy has been consulted for Cefepime and vancomycin dosing. PCT 38.9  WBC: 21.3   Plan: 1. Patient received vancomycin 1g IV x 1 in ED. Will order additional 750mg  IV x 1 dose for a total loading dose of 1750mg . Patient's Scr 1.54. Will plan to dose by levels for now due to unstable renal function.  Level ordered 3/6 @ 1200.  2. Start Cefepime 2g IV every 24 hours based on CrCl <48ml/min.   Pharmacy will continue to monitor and adjust dose based on renal function.   Height: 5\' 9"  (175.3 cm) Weight: 180 lb 12.4 oz (82 kg) IBW/kg (Calculated) : 70.7  Temp (24hrs), Avg:98.4 F (36.9 C), Min:98.4 F (36.9 C), Max:98.4 F (36.9 C)  Recent Labs  Lab 06/21/19 0705  WBC 21.3*  CREATININE 1.54*  LATICACIDVEN 2.9*    Estimated Creatinine Clearance: 28.1 mL/min (A) (by C-G formula based on SCr of 1.54 mg/dL (H)).    Allergies  Allergen Reactions  . Morphine And Related Nausea And Vomiting  . Tramadol Nausea And Vomiting  . Sulfamethoxazole-Trimethoprim Rash    Antimicrobials this admission: 3/5 CTX x 1 dose 3/5 Cefepime >>  3/5 vancomycin  >>   Microbiology results: SARS Coronavirus Ag: negative  Resp Panel: pending  Ucx: pending  BCx: pending   Thank you for allowing pharmacy to be a part of this patient's care.  Pernell Dupre, PharmD, BCPS Clinical Pharmacist 06/21/2019 10:34 AM

## 2019-06-21 NOTE — ED Notes (Signed)
Date and time results received: 06/21/19  0830 (use smartphrase ".now" to insert current time)  Test: Lactic Critical Value: 2.9  Name of Provider Notified: Quentin Cornwall  Orders Received? Or Actions Taken?: Orders Received - See Orders for details

## 2019-06-21 NOTE — H&P (View-Only) (Signed)
Urology Consult  Requesting physician: Drusilla Kanner, MD  Reason for consultation: Left ureteral calculus with suspected sepsis  Chief Complaint: Flank pain  History of Present Illness: Robert [name] is a 86 y.o. male with a history of recurrent stone disease who presented to the ED this morning with altered mental status.  His wife had reported intermittent chills and rigors.  He had been complaining of intermittent left flank pain.  CT remarkable for a 4 mm left distal ureteral calculus with mild hydronephrosis.  Urinalysis was nitrite positive with pyuria and lactate level elevated.  He has a history of recurrent stone disease and underwent stent placement for UTI and subsequent ureteroscopy by Dr. Diamantina Providence December 2019.   Past Medical History:  Diagnosis Date  . Aortic atherosclerosis ([place]) 03/01/2016  . Arthritis   . 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 High cholesterol   . History of kidney stones   . Hypertension   . TIA (transient ischemic attack)   . 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 CYSTOSCOPY W/ RETROGRADES Left 03/23/2018   Procedure: CYSTOSCOPY WITH RETROGRADE PYELOGRAM;  Surgeon: Billey Co, MD;  Location: [facility] OR[facility];  Service: Urology;  Laterality: Left;  . CYSTOSCOPY WITH STENT PLACEMENT Left 02/20/2018   Procedure: CYSTOSCOPY WITH STENT PLACEMENT;  Surgeon: Billey Co, MD;  Location: [facility] OR[facility];  Service: Urology;  Laterality: Left;  . CYSTOSCOPY/URETEROSCOPY/HOLMIUM LASER/STENT PLACEMENT Left 03/23/2018   Procedure: CYSTOSCOPY/URETEROSCOPY/HOLMIUM LASER/STENT Exchange;  Surgeon: Billey Co, MD;  Location: [facility] OR[facility];  Service: Urology;  Laterality: Left;  left stent exchange  . LEG SURGERY     broken tib/fib  . TOTAL HIP ARTHROPLASTY Bilateral     Home Medications:  Current Meds  Medication Sig  . amLODipine (NORVASC) 5 MG tablet Take 5 mg by mouth daily.  Marland Kitchen aspirin EC 81 MG tablet Take  81 mg by mouth daily.   Marland Kitchen atorvastatin (LIPITOR) 10 MG tablet Take 10 mg by mouth at bedtime.   . Multiple Vitamin (MULTIVITAMIN WITH MINERALS) TABS tablet Take 1 tablet by mouth daily.  . Omega-3 Fatty Acids (FISH OIL PO) Take 1 capsule by mouth daily.  . tamsulosin (FLOMAX) 0.4 MG CAPS capsule Take 0.4 mg by mouth daily.    Allergies:  Allergies  Allergen Reactions  . Morphine And Related Nausea And Vomiting  . Tramadol Nausea And Vomiting  . Sulfamethoxazole-Trimethoprim Rash    Family History  Problem Relation Age of Onset  . Prostate cancer Neg Hx   . Bladder Cancer Neg Hx   . Kidney cancer Neg Hx     Social History:  reports that he has quit smoking. His smoking use included pipe. He has never used smokeless tobacco. He reports current alcohol use of about 7.0 standard drinks of alcohol per week. He reports that he does not use drugs.  ROS: A complete review of systems was performed.  All systems are negative except for pertinent findings as noted.  Physical Exam:  Vital signs in last 24 hours: Temp:  [97.9 F (36.6 C)-98.4 F (36.9 C)] 97.9 F (36.6 C) (03/05 1132) Pulse Rate:  [78-94] 94 (03/05 1132) Resp:  [18-29] 18 (03/05 1132) BP: (110-131)/(55-78) 110/78 (03/05 1132) SpO2:  [91 %-96 %] 92 % (03/05 1132) Weight:  [82 kg-87.1 kg] 87.1 kg (03/05 1132) Constitutional:  Alert and oriented, No acute distress HEENT: [place] AT, moist  mucus membranes.  Trachea midline, no masses Cardiovascular: Regular rate and rhythm, no clubbing, cyanosis, or edema. Respiratory: Normal respiratory effort, lungs clear bilaterally GI: Abdomen is soft, nontender, nondistended, no abdominal masses GU: No CVA tenderness Skin: No rashes, bruises or suspicious lesions Lymph: No cervical or inguinal adenopathy Neurologic: Grossly intact, no focal deficits, moving all 4 extremities Psychiatric: Normal mood and affect   Laboratory Data:  Recent Labs    06/21/19 0705  WBC 21.3*  HGB  12.6*  HCT 38.1*   Recent Labs    06/21/19 0705  NA 142  K 4.2  CL 112*  CO2 20*  GLUCOSE 101*  BUN 28*  CREATININE 1.54*  CALCIUM 9.0   No results for input(s): LABPT, INR in the last 72 hours. No results for input(s): LABURIN in the last 72 hours. Results for orders placed or performed during the hospital encounter of 06/21/19  Respiratory Panel by RT PCR (Flu A&B, Covid) - Nasopharyngeal Swab     Status: None   Collection Time: 06/21/19  9:44 AM   Specimen: Nasopharyngeal Swab  Result Value Ref Range Status   SARS Coronavirus 2 by RT PCR NEGATIVE NEGATIVE Final    Comment: (NOTE) SARS-CoV-2 target nucleic acids are NOT DETECTED. The SARS-CoV-2 RNA is generally detectable in upper respiratoy specimens during the acute phase of infection. The lowest concentration of SARS-CoV-2 viral copies this assay can detect is 131 copies/mL. A negative result does not preclude SARS-Cov-2 infection and should not be used as the sole basis for treatment or other patient management decisions. A negative result may occur with  improper specimen collection/handling, submission of specimen other than nasopharyngeal swab, presence of viral mutation(s) within the areas targeted by this assay, and inadequate number of viral copies (<131 copies/mL). A negative result must be combined with clinical observations, patient history, and epidemiological information. The expected result is Negative. Fact Sheet for Patients:  PinkCheek.be Fact Sheet for Healthcare Providers:  GravelBags.it This test is not yet ap proved or cleared by the [place] FDA and  has been authorized for detection and/or diagnosis of SARS-CoV-2 by FDA under an Emergency Use Authorization (EUA). This EUA will remain  in effect (meaning this test can be used) for the duration of the COVID-19 declaration under Section 564(b)(1) of the Act, 21 U.S.C. section  360bbb-3(b)(1), unless the authorization is terminated or revoked sooner.    Influenza A by PCR NEGATIVE NEGATIVE Final   Influenza B by PCR NEGATIVE NEGATIVE Final    Comment: (NOTE) The Xpert Xpress SARS-CoV-2/FLU/RSV assay is intended as an aid in  the diagnosis of influenza from Nasopharyngeal swab specimens and  should not be used as a sole basis for treatment. Nasal washings and  aspirates are unacceptable for Xpert Xpress SARS-CoV-2/FLU/RSV  testing. Fact Sheet for Patients: PinkCheek.be Fact Sheet for Healthcare Providers: GravelBags.it This test is not yet approved or cleared by the [place] FDA and  has been authorized for detection and/or diagnosis of SARS-CoV-2 by  FDA under an Emergency Use Authorization (EUA). This EUA will remain  in effect (meaning this test can be used) for the duration of the  Covid-19 declaration under Section 564(b)(1) of the Act, 21  U.S.C. section 360bbb-3(b)(1), unless the authorization is  terminated or revoked. Performed at [facility], [address] [place], [place] [address]      Radiologic Imaging: [facility] Chest Roy 1 View  Result Date: 06/21/2019 CLINICAL DATA:  Altered mental status EXAM: PORTABLE CHEST 1  VIEW COMPARISON:  02/19/2018 chest radiograph. FINDINGS: Stable cardiomediastinal silhouette with normal heart size. No pneumothorax. No significant pleural effusions. No pulmonary edema. Mild bibasilar scarring versus atelectasis, unchanged. No acute consolidative airspace disease. IMPRESSION: Stable mild bibasilar scarring versus atelectasis. Electronically Signed   By: Ilona Sorrel M.D.   On: 06/21/2019 08:00   DG OR UROLOGY CYSTO IMAGE ([facility] ONLY)  Result Date: 06/21/2019 There is no interpretation for this exam.  This order is for images obtained during a surgical procedure.  Please See "Surgeries" Tab for more information regarding the procedure.   CT Renal  Stone Study  Result Date: 06/21/2019 CLINICAL DATA:  86 year old male with history of left-sided flank pain. Suspected kidney stones. EXAM: CT ABDOMEN AND PELVIS WITHOUT CONTRAST TECHNIQUE: Multidetector CT imaging of the abdomen and pelvis was performed following the standard protocol without IV contrast. COMPARISON:  CT the abdomen and pelvis 02/19/2018. FINDINGS: Lower chest: Mild chronic scarring in the left lower lobe, similar to the prior study. Aortic atherosclerosis. Calcifications of the aortic valve and mitral annulus. Hepatobiliary: No definite cystic or solid hepatic lesions are confidently identified on today's noncontrast CT examination. Calcified gallstones are noted in the gallbladder measuring up to 2.2 cm in diameter. No findings to suggest an acute cholecystitis at this time. Pancreas: No definite pancreatic mass or peripancreatic fluid collections or inflammatory changes are noted on today's noncontrast CT examination. Spleen: Unremarkable. Adrenals/Urinary Tract: Nonobstructive calculi are noted within the collecting systems of both kidneys, largest of which is in the lower pole of the right kidney measuring 1.1 cm in diameter. In addition, there is a 4 mm calculus adjacent to the left ureterovesicular junction in the distal third of the left ureter (axial image 77 of series 2). This is associated with minimal proximal left hydroureteronephrosis indicative of very mild obstruction. Unenhanced appearance of the kidneys is otherwise unremarkable. Urinary bladder is normal in appearance. Bilateral adrenal glands are normal in appearance. Stomach/Bowel: Unenhanced appearance of the stomach is normal. No pathologic dilatation of small bowel or colon. Normal appendix. Vascular/Lymphatic: Aortic atherosclerosis. No lymphadenopathy noted in the abdomen or pelvis. Reproductive: Prostate gland and seminal vesicles are poorly visualized secondary to beam hardening artifact from the patient's bilateral hip  arthroplasties. Other: No significant volume of ascites.  No pneumoperitoneum. Musculoskeletal: Status post bilateral hip arthroplasty. There are no aggressive appearing lytic or blastic lesions noted in the visualized portions of the skeleton. IMPRESSION: 1. 4 mm calculus adjacent to the left ureterovesicular junction with very mild proximal left hydroureteronephrosis indicative of very mild obstruction. 2. Other nonobstructive calculi in the collecting systems of the kidneys bilaterally measuring up to 1.1 cm in diameter in the lower pole collecting system of the right kidney. 3. Cholelithiasis without evidence of acute cholecystitis at this time. 4. Aortic atherosclerosis. 5. There are calcifications of the aortic valve and mitral annulus. Echocardiographic correlation for evaluation of potential valvular dysfunction may be warranted if clinically indicated. Electronically Signed   By: Vinnie Langton M.D.   On: 06/21/2019 09:11    Impression:  86 yo male with a 4 mm left distal ureteral calculus and mild hydronephrosis.  He has had rigors, elevated lactate and urinalysis nitrite positive with pyuria.  Recommendation:  Cystoscopy with left ureteral stent placement.  The procedure was discussed with the patient and his wife including potential risks of bleeding, worsening sepsis, ureteral injury.  It was stressed no attempts will be made at stone removal and he will need a follow-up procedure at  a later date.   06/21/2019, 11:42 AM  Ancil Giovanni,  MD

## 2019-06-21 NOTE — Anesthesia Procedure Notes (Signed)
Procedure Name: Intubation Date/Time: 06/21/2019 12:17 PM Performed by: Willette Alma, CRNA Pre-anesthesia Checklist: Patient identified, Patient being monitored, Timeout performed, Emergency Drugs available and Suction available Patient Re-evaluated:Patient Re-evaluated prior to induction Oxygen Delivery Method: Circle system utilized Preoxygenation: Pre-oxygenation with 100% oxygen Induction Type: IV induction Ventilation: Mask ventilation without difficulty Laryngoscope Size: 3 and McGraph Grade View: Grade I Tube type: Oral Tube size: 7.0 mm Number of attempts: 1 Airway Equipment and Method: Stylet Placement Confirmation: ETT inserted through vocal cords under direct vision,  positive ETCO2 and breath sounds checked- equal and bilateral Secured at: 21 cm Tube secured with: Tape Dental Injury: Teeth and Oropharynx as per pre-operative assessment

## 2019-06-21 NOTE — Progress Notes (Signed)
PHARMACY - PHYSICIAN COMMUNICATION CRITICAL VALUE ALERT - BLOOD CULTURE IDENTIFICATION (BCID)  Robert [name] is an 86 y.o. male who presented to [facility] on 06/21/2019 with a chief complaint of Obstructing Nephrolithiasis  Assessment:  Blood culture with 3 of 4 bottles with GNR, BCID identified E.coli  Name of physician (or Provider) ContactedStark Klein, NP  Current antibiotics: Vancomycin and Cefepime  Changes to prescribed antibiotics recommended:  Will discontinue Cefepime. Start meropenem 1g IV q12h. Will continue Vancomycin for now as patient has h/o E.faecalis UTI.  Results for orders placed or performed during the hospital encounter of 06/21/19  Blood Culture ID Panel (Reflexed) (Collected: 06/21/2019  7:05 AM)  Result Value Ref Range   Enterococcus species NOT DETECTED NOT DETECTED   Listeria monocytogenes NOT DETECTED NOT DETECTED   Staphylococcus species NOT DETECTED NOT DETECTED   Staphylococcus aureus (BCID) NOT DETECTED NOT DETECTED   Streptococcus species NOT DETECTED NOT DETECTED   Streptococcus agalactiae NOT DETECTED NOT DETECTED   Streptococcus pneumoniae NOT DETECTED NOT DETECTED   Streptococcus pyogenes NOT DETECTED NOT DETECTED   Acinetobacter baumannii NOT DETECTED NOT DETECTED   Enterobacteriaceae species DETECTED (A) NOT DETECTED   Enterobacter cloacae complex NOT DETECTED NOT DETECTED   Escherichia coli DETECTED (A) NOT DETECTED   Klebsiella oxytoca NOT DETECTED NOT DETECTED   Klebsiella pneumoniae NOT DETECTED NOT DETECTED   Proteus species NOT DETECTED NOT DETECTED   Serratia marcescens NOT DETECTED NOT DETECTED   Carbapenem resistance NOT DETECTED NOT DETECTED   Haemophilus influenzae NOT DETECTED NOT DETECTED   Neisseria meningitidis NOT DETECTED NOT DETECTED   Pseudomonas aeruginosa NOT DETECTED NOT DETECTED   Candida albicans NOT DETECTED NOT DETECTED   Candida glabrata NOT DETECTED NOT DETECTED   Candida krusei NOT DETECTED NOT DETECTED   Candida  parapsilosis NOT DETECTED NOT DETECTED   Candida tropicalis NOT DETECTED NOT DETECTED    Paulina Fusi, PharmD, BCPS 06/21/2019 10:17 PM

## 2019-06-21 NOTE — Plan of Care (Addendum)
VSS. NSR on tele. Denies any pain. Post urology procedure. Foley in place. Urine amber, amt adequate. IVF and antibiotics infusing per order. Assessment as documented. POC reviewed. No new needs/concerns at this time.   Problem: Education: Goal: Knowledge of General Education information will improve Description: Including pain rating scale, medication(s)/side effects and non-pharmacologic comfort measures Outcome: Progressing   Problem: Health Behavior/Discharge Planning: Goal: Ability to manage health-related needs will improve Outcome: Progressing   Problem: Clinical Measurements: Goal: Ability to maintain clinical measurements within normal limits will improve Outcome: Progressing Goal: Will remain free from infection Outcome: Progressing Goal: Diagnostic test results will improve Outcome: Progressing Goal: Respiratory complications will improve Outcome: Progressing Goal: Cardiovascular complication will be avoided Outcome: Progressing   Problem: Activity: Goal: Risk for activity intolerance will decrease Outcome: Progressing   Problem: Nutrition: Goal: Adequate nutrition will be maintained Outcome: Progressing   Problem: Coping: Goal: Level of anxiety will decrease Outcome: Progressing   Problem: Elimination: Goal: Will not experience complications related to bowel motility Outcome: Progressing Goal: Will not experience complications related to urinary retention Outcome: Progressing   Problem: Pain Managment: Goal: General experience of comfort will improve Outcome: Progressing   Problem: Safety: Goal: Ability to remain free from injury will improve Outcome: Progressing   Problem: Skin Integrity: Goal: Risk for impaired skin integrity will decrease Outcome: Progressing

## 2019-06-21 NOTE — Anesthesia Preprocedure Evaluation (Signed)
Anesthesia Evaluation  Patient identified by MRN, date of birth, ID band Patient awake    Reviewed: Allergy & Precautions, NPO status , Patient's Chart, lab work & pertinent test results  History of Anesthesia Complications Negative for: history of anesthetic complications  Airway Mallampati: III       Dental   Pulmonary neg sleep apnea, neg COPD, Not current smoker, former smoker,           Cardiovascular hypertension, Pt. on medications (-) Past MI and (-) CHF (-) dysrhythmias (-) Valvular Problems/Murmurs     Neuro/Psych neg Seizures TIA (lost vision in R eye, still not present)   GI/Hepatic Neg liver ROS, neg GERD  ,  Endo/Other  neg diabetes  Renal/GU Renal disease (stones)     Musculoskeletal   Abdominal   Peds  Hematology   Anesthesia Other Findings    Reproductive/Obstetrics                             Anesthesia Physical  Anesthesia Plan  ASA: III  Anesthesia Plan: General   Post-op Pain Management:    Induction: Intravenous  PONV Risk Score and Plan: 2 and Ondansetron, Dexamethasone and Treatment may vary due to age or medical condition  Airway Management Planned: Oral ETT  Additional Equipment:   Intra-op Plan:   Post-operative Plan:   Informed Consent: I have reviewed the patients History and Physical, chart, labs and discussed the procedure including the risks, benefits and alternatives for the proposed anesthesia with the patient or authorized representative who has indicated his/her understanding and acceptance.       Plan Discussed with:   Anesthesia Plan Comments:         Anesthesia Quick Evaluation

## 2019-06-21 NOTE — ED Notes (Signed)
Pt cleansed of urine incontinence by this RN and Meagan, RN. Pt tolerated well. Pt repositioned in bed at this time. Pt's wife at bedside at this time.

## 2019-06-21 NOTE — Progress Notes (Signed)
PHARMACY -  BRIEF ANTIBIOTIC NOTE   Pharmacy has received consult(s) for Vancomycin from an ED provider.  The patient's profile has been reviewed for ht/wt/allergies/indication/available labs.    One time order(s) placed for Vancomycin 1 gram x 1  Further antibiotics/pharmacy consults should be ordered by admitting physician if indicated.                       Thank you, Tiny Rietz A 06/21/2019  8:31 AM

## 2019-06-21 NOTE — ED Notes (Signed)
2nd abx initiated per MD order. Pt resting in bed with wife at bedside. VSS at this time. Pt and wife deny further needs. PT and wife updated regarding plan of care. Pt's wife states understanding at this time. Call bell remains within reach at this time.

## 2019-06-21 NOTE — ED Triage Notes (Signed)
Pt arrives [facility] from the independent living side of [facility]. Pt baseline is A&Ox4, help getting around due to previous strokes. Pt is now AMS and oriented to self. Pt is warm to touch.   BP 160/90 HR 90 92% RA--> 95% 2Lnc BS122  18G L wrist

## 2019-06-21 NOTE — ED Notes (Signed)
Pt transported to OR by OR orderly.

## 2019-06-21 NOTE — ED Notes (Signed)
Pt's wife called out for pt to use bathroom, this RN gave pt a urinal and assisted him to use it. Pt urnated 22mL dark yellow urine. Pt denies further needs.

## 2019-06-21 NOTE — Op Note (Signed)
Preoperative diagnosis:  Left distal ureteral calculus Sepsis from suspected urinary source  Postoperative diagnosis:  Same  Procedure:  Cystoscopy Left ureteral stent placement (6FR/26 cm)  Left retrograde pyelography with interpretation   Surgeon: Nicki Reaper C. Jaclene Bartelt, M.D.  Anesthesia: General  Complications: None  Intraoperative findings:  1.  Left retrograde pyelogram mild left hydronephrosis and hydroureter to a filling defect in the left distal ureter  EBL: Minimal  Specimens: None  Indication: HAMISH TRAYER is a 86 y.o. male with a history of recurrent stone disease who presented to the ED this morning with chills/rigors and left flank pain.  CT with a 4 mm left distal ureteral calculus with mild hydronephrosis.  Urinalysis with pyuria and nitrite positive.  Elevated lactate and leukocytosis.  After reviewing the management options for treatment, he elected to proceed with the above surgical procedure(s). We have discussed the potential benefits and risks of the procedure, side effects of the proposed treatment, the likelihood of the patient achieving the goals of the procedure, and any potential problems that might occur during the procedure or recuperation. Informed consent has been obtained.  Description of procedure:  The patient was taken to the operating room and general anesthesia was induced.  The patient was placed in the dorsal lithotomy position, prepped and draped in the usual sterile fashion, and preoperative antibiotics were administered. A preoperative time-out was performed.   A 21 French cystoscope was lubricated and passed under direct vision.  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  Prostate with moderate lateral lobe enlargement.  The bladder was then systematically examined after emptying purulent urine.  There was a trabeculated bladder with scattered cellules/diverticula.  Ureteral procedure normal-appearing  bilaterally.  There was no evidence for any bladder tumors, stones, or other mucosal pathology.    Attention then turned to the left ureteral orifice and a 0.038 Sensor wire was placed through the cystoscope into the ureteral orifice and advanced to the left renal pelvis without difficulty.  A 5 French ureteral catheter was then advanced over the wire to the vicinity of the renal pelvis.  The wire was removed however no urine could be aspirated from the catheter with adjusting the position.  Omnipaque contrast was injected through the ureteral catheter and a retrograde pyelogram was performed with findings as dictated above.  After injection of contrast with the catheter position in the mid renal pelvis and no urine was aspirated.  The guidewire was replaced in the ureteral catheter was removed.  A 6 French/26 cm contour stent was then advanced over the wire without difficulty.  The proximal tip of the stent was within an upper pole calyx however should reposition.  The distal stent and was well positioned in the bladder.  After stent placement brisk efflux of purulent appearing urine was noted draining through the stent.  A 16 French Foley catheter was placed to gravity drainage.  The patient appeared to tolerate the procedure well and without complications.  After anesthetic reversal he was transported to the PACU in satisfactory condition.    Remi Giovanni, MD

## 2019-06-21 NOTE — Transfer of Care (Signed)
Immediate Anesthesia Transfer of Care Note  Patient: Robert [name]  Procedure(s) Performed: CYSTOSCOPY WITH STENT PLACEMENT (Left Ureter) CYSTOSCOPY WITH RETROGRADE PYELOGRAM (Left Ureter)  Patient Location: PACU  Anesthesia Type:General  Level of Consciousness: awake, alert  and oriented  Airway & Oxygen Therapy: Patient Spontanous Breathing and Patient connected to face mask oxygen  Post-op Assessment: Report given to RN and Post -op Vital signs reviewed and stable  Post vital signs: Reviewed and stable  Last Vitals:  Vitals Value Taken Time  BP    Temp    Pulse 78 06/21/19 1258  Resp 21 06/21/19 1258  SpO2 98 % 06/21/19 1258  Vitals shown include unvalidated device data.  Last Pain:  Vitals:   06/21/19 1132  TempSrc: Temporal  PainSc: 0-No pain         Complications: No apparent anesthesia complications

## 2019-06-21 NOTE — ED Notes (Signed)
Pt returned from CT °

## 2019-06-21 NOTE — ED Notes (Signed)
Pt transported to CT ?

## 2019-06-21 NOTE — Consult Note (Signed)
Urology Consult  Requesting physician: Drusilla Kanner, MD  Reason for consultation: Left ureteral calculus with suspected sepsis  Chief Complaint: Flank pain  History of Present Illness: Robert [name] is a 86 y.o. male with a history of recurrent stone disease who presented to the ED this morning with altered mental status.  His wife had reported intermittent chills and rigors.  He had been complaining of intermittent left flank pain.  CT remarkable for a 4 mm left distal ureteral calculus with mild hydronephrosis.  Urinalysis was nitrite positive with pyuria and lactate level elevated.  He has a history of recurrent stone disease and underwent stent placement for UTI and subsequent ureteroscopy by Dr. Diamantina Providence December 2019.   Past Medical History:  Diagnosis Date  . Aortic atherosclerosis ([place]) 03/01/2016  . Arthritis   . 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 High cholesterol   . History of kidney stones   . Hypertension   . TIA (transient ischemic attack)   . 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 CYSTOSCOPY W/ RETROGRADES Left 03/23/2018   Procedure: CYSTOSCOPY WITH RETROGRADE PYELOGRAM;  Surgeon: Billey Co, MD;  Location: [facility] OR[facility];  Service: Urology;  Laterality: Left;  . CYSTOSCOPY WITH STENT PLACEMENT Left 02/20/2018   Procedure: CYSTOSCOPY WITH STENT PLACEMENT;  Surgeon: Billey Co, MD;  Location: [facility] OR[facility];  Service: Urology;  Laterality: Left;  . CYSTOSCOPY/URETEROSCOPY/HOLMIUM LASER/STENT PLACEMENT Left 03/23/2018   Procedure: CYSTOSCOPY/URETEROSCOPY/HOLMIUM LASER/STENT Exchange;  Surgeon: Billey Co, MD;  Location: [facility] OR[facility];  Service: Urology;  Laterality: Left;  left stent exchange  . LEG SURGERY     broken tib/fib  . TOTAL HIP ARTHROPLASTY Bilateral     Home Medications:  Current Meds  Medication Sig  . amLODipine (NORVASC) 5 MG tablet Take 5 mg by mouth daily.  Marland Kitchen aspirin EC 81 MG tablet Take  81 mg by mouth daily.   Marland Kitchen atorvastatin (LIPITOR) 10 MG tablet Take 10 mg by mouth at bedtime.   . Multiple Vitamin (MULTIVITAMIN WITH MINERALS) TABS tablet Take 1 tablet by mouth daily.  . Omega-3 Fatty Acids (FISH OIL PO) Take 1 capsule by mouth daily.  . tamsulosin (FLOMAX) 0.4 MG CAPS capsule Take 0.4 mg by mouth daily.    Allergies:  Allergies  Allergen Reactions  . Morphine And Related Nausea And Vomiting  . Tramadol Nausea And Vomiting  . Sulfamethoxazole-Trimethoprim Rash    Family History  Problem Relation Age of Onset  . Prostate cancer Neg Hx   . Bladder Cancer Neg Hx   . Kidney cancer Neg Hx     Social History:  reports that he has quit smoking. His smoking use included pipe. He has never used smokeless tobacco. He reports current alcohol use of about 7.0 standard drinks of alcohol per week. He reports that he does not use drugs.  ROS: A complete review of systems was performed.  All systems are negative except for pertinent findings as noted.  Physical Exam:  Vital signs in last 24 hours: Temp:  [97.9 F (36.6 C)-98.4 F (36.9 C)] 97.9 F (36.6 C) (03/05 1132) Pulse Rate:  [78-94] 94 (03/05 1132) Resp:  [18-29] 18 (03/05 1132) BP: (110-131)/(55-78) 110/78 (03/05 1132) SpO2:  [91 %-96 %] 92 % (03/05 1132) Weight:  [82 kg-87.1 kg] 87.1 kg (03/05 1132) Constitutional:  Alert and oriented, No acute distress HEENT: [place] AT, moist  mucus membranes.  Trachea midline, no masses Cardiovascular: Regular rate and rhythm, no clubbing, cyanosis, or edema. Respiratory: Normal respiratory effort, lungs clear bilaterally GI: Abdomen is soft, nontender, nondistended, no abdominal masses GU: No CVA tenderness Skin: No rashes, bruises or suspicious lesions Lymph: No cervical or inguinal adenopathy Neurologic: Grossly intact, no focal deficits, moving all 4 extremities Psychiatric: Normal mood and affect   Laboratory Data:  Recent Labs    06/21/19 0705  WBC 21.3*  HGB  12.6*  HCT 38.1*   Recent Labs    06/21/19 0705  NA 142  K 4.2  CL 112*  CO2 20*  GLUCOSE 101*  BUN 28*  CREATININE 1.54*  CALCIUM 9.0   No results for input(s): LABPT, INR in the last 72 hours. No results for input(s): LABURIN in the last 72 hours. Results for orders placed or performed during the hospital encounter of 06/21/19  Respiratory Panel by RT PCR (Flu A&B, Covid) - Nasopharyngeal Swab     Status: None   Collection Time: 06/21/19  9:44 AM   Specimen: Nasopharyngeal Swab  Result Value Ref Range Status   SARS Coronavirus 2 by RT PCR NEGATIVE NEGATIVE Final    Comment: (NOTE) SARS-CoV-2 target nucleic acids are NOT DETECTED. The SARS-CoV-2 RNA is generally detectable in upper respiratoy specimens during the acute phase of infection. The lowest concentration of SARS-CoV-2 viral copies this assay can detect is 131 copies/mL. A negative result does not preclude SARS-Cov-2 infection and should not be used as the sole basis for treatment or other patient management decisions. A negative result may occur with  improper specimen collection/handling, submission of specimen other than nasopharyngeal swab, presence of viral mutation(s) within the areas targeted by this assay, and inadequate number of viral copies (<131 copies/mL). A negative result must be combined with clinical observations, patient history, and epidemiological information. The expected result is Negative. Fact Sheet for Patients:  PinkCheek.be Fact Sheet for Healthcare Providers:  GravelBags.it This test is not yet ap proved or cleared by the [place] FDA and  has been authorized for detection and/or diagnosis of SARS-CoV-2 by FDA under an Emergency Use Authorization (EUA). This EUA will remain  in effect (meaning this test can be used) for the duration of the COVID-19 declaration under Section 564(b)(1) of the Act, 21 U.S.C. section  360bbb-3(b)(1), unless the authorization is terminated or revoked sooner.    Influenza A by PCR NEGATIVE NEGATIVE Final   Influenza B by PCR NEGATIVE NEGATIVE Final    Comment: (NOTE) The Xpert Xpress SARS-CoV-2/FLU/RSV assay is intended as an aid in  the diagnosis of influenza from Nasopharyngeal swab specimens and  should not be used as a sole basis for treatment. Nasal washings and  aspirates are unacceptable for Xpert Xpress SARS-CoV-2/FLU/RSV  testing. Fact Sheet for Patients: PinkCheek.be Fact Sheet for Healthcare Providers: GravelBags.it This test is not yet approved or cleared by the [place] FDA and  has been authorized for detection and/or diagnosis of SARS-CoV-2 by  FDA under an Emergency Use Authorization (EUA). This EUA will remain  in effect (meaning this test can be used) for the duration of the  Covid-19 declaration under Section 564(b)(1) of the Act, 21  U.S.C. section 360bbb-3(b)(1), unless the authorization is  terminated or revoked. Performed at [facility], [address], [place], [place] [address]      Radiologic Imaging: [facility] Chest [place] 1 View  Result Date: 06/21/2019 CLINICAL DATA:  Altered mental status EXAM: PORTABLE CHEST 1  VIEW COMPARISON:  02/19/2018 chest radiograph. FINDINGS: Stable cardiomediastinal silhouette with normal heart size. No pneumothorax. No significant pleural effusions. No pulmonary edema. Mild bibasilar scarring versus atelectasis, unchanged. No acute consolidative airspace disease. IMPRESSION: Stable mild bibasilar scarring versus atelectasis. Electronically Signed   By: Ilona Sorrel M.D.   On: 06/21/2019 08:00   DG OR UROLOGY CYSTO IMAGE ([facility] ONLY)  Result Date: 06/21/2019 There is no interpretation for this exam.  This order is for images obtained during a surgical procedure.  Please See "Surgeries" Tab for more information regarding the procedure.   CT Renal  Stone Study  Result Date: 06/21/2019 CLINICAL DATA:  86 year old male with history of left-sided flank pain. Suspected kidney stones. EXAM: CT ABDOMEN AND PELVIS WITHOUT CONTRAST TECHNIQUE: Multidetector CT imaging of the abdomen and pelvis was performed following the standard protocol without IV contrast. COMPARISON:  CT the abdomen and pelvis 02/19/2018. FINDINGS: Lower chest: Mild chronic scarring in the left lower lobe, similar to the prior study. Aortic atherosclerosis. Calcifications of the aortic valve and mitral annulus. Hepatobiliary: No definite cystic or solid hepatic lesions are confidently identified on today's noncontrast CT examination. Calcified gallstones are noted in the gallbladder measuring up to 2.2 cm in diameter. No findings to suggest an acute cholecystitis at this time. Pancreas: No definite pancreatic mass or peripancreatic fluid collections or inflammatory changes are noted on today's noncontrast CT examination. Spleen: Unremarkable. Adrenals/Urinary Tract: Nonobstructive calculi are noted within the collecting systems of both kidneys, largest of which is in the lower pole of the right kidney measuring 1.1 cm in diameter. In addition, there is a 4 mm calculus adjacent to the left ureterovesicular junction in the distal third of the left ureter (axial image 77 of series 2). This is associated with minimal proximal left hydroureteronephrosis indicative of very mild obstruction. Unenhanced appearance of the kidneys is otherwise unremarkable. Urinary bladder is normal in appearance. Bilateral adrenal glands are normal in appearance. Stomach/Bowel: Unenhanced appearance of the stomach is normal. No pathologic dilatation of small bowel or colon. Normal appendix. Vascular/Lymphatic: Aortic atherosclerosis. No lymphadenopathy noted in the abdomen or pelvis. Reproductive: Prostate gland and seminal vesicles are poorly visualized secondary to beam hardening artifact from the patient's bilateral hip  arthroplasties. Other: No significant volume of ascites.  No pneumoperitoneum. Musculoskeletal: Status post bilateral hip arthroplasty. There are no aggressive appearing lytic or blastic lesions noted in the visualized portions of the skeleton. IMPRESSION: 1. 4 mm calculus adjacent to the left ureterovesicular junction with very mild proximal left hydroureteronephrosis indicative of very mild obstruction. 2. Other nonobstructive calculi in the collecting systems of the kidneys bilaterally measuring up to 1.1 cm in diameter in the lower pole collecting system of the right kidney. 3. Cholelithiasis without evidence of acute cholecystitis at this time. 4. Aortic atherosclerosis. 5. There are calcifications of the aortic valve and mitral annulus. Echocardiographic correlation for evaluation of potential valvular dysfunction may be warranted if clinically indicated. Electronically Signed   By: Vinnie Langton M.D.   On: 06/21/2019 09:11    Impression:  86 yo male with a 4 mm left distal ureteral calculus and mild hydronephrosis.  He has had rigors, elevated lactate and urinalysis nitrite positive with pyuria.  Recommendation:  Cystoscopy with left ureteral stent placement.  The procedure was discussed with the patient and his wife including potential risks of bleeding, worsening sepsis, ureteral injury.  It was stressed no attempts will be made at stone removal and he will need a follow-up procedure at  a later date.   06/21/2019, 11:42 AM  Heman Giovanni,  MD

## 2019-06-21 NOTE — ED Provider Notes (Signed)
[facility] Emergency Department Provider Note    First MD Initiated Contact with Patient 06/21/19 [phone]     (approximate)  I have reviewed the triage vital signs and the nursing notes.   HISTORY  Chief Complaint Altered Mental Status  Level V Caveat:  AMS  HPI Robert [name] is a 86 y.o. male with the below listed past medical history presents from facility due to concern for altered mental status.  Not able provide any additional history.  He does not appear to be in any discomfort.  He is unable to follow commands.  Reportedly felt warm to touch this morning.  Wife reports patient has been have intermittent chills and rigors.  Reports h/o kidney stones.   Past Medical History:  Diagnosis Date  . Aortic atherosclerosis ([place]) 03/01/2016  . Arthritis   . 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 High cholesterol   . History of kidney stones   . Hypertension   . TIA (transient ischemic attack)   . 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 Prostate cancer Neg Hx   . Bladder Cancer Neg Hx   . Kidney cancer Neg Hx    Past Surgical History:  Procedure Laterality Date  . CYSTOSCOPY W/ RETROGRADES Left 03/23/2018   Procedure: CYSTOSCOPY WITH RETROGRADE PYELOGRAM;  Surgeon: Billey Co, MD;  Location: [facility] ORS;  Service: Urology;  Laterality: Left;  . CYSTOSCOPY WITH STENT PLACEMENT Left 02/20/2018   Procedure: CYSTOSCOPY WITH STENT PLACEMENT;  Surgeon: Billey Co, MD;  Location: [facility] ORS;  Service: Urology;  Laterality: Left;  . CYSTOSCOPY/URETEROSCOPY/HOLMIUM LASER/STENT PLACEMENT Left 03/23/2018   Procedure: CYSTOSCOPY/URETEROSCOPY/HOLMIUM LASER/STENT Exchange;  Surgeon: Billey Co, MD;  Location: [facility] ORS;  Service: Urology;  Laterality: Left;  left stent exchange  . LEG SURGERY     broken tib/fib  . TOTAL HIP ARTHROPLASTY Bilateral    Patient Active Problem List   Diagnosis Date Noted  . ARF (acute  renal failure) ([place]) 03/27/2018  . Kidney stone 02/19/2018  . UTI (urinary tract infection) 02/19/2018  . Sepsis ([place]) 02/01/2017  . HCAP (healthcare-associated pneumonia) 02/01/2017  . Acute on chronic renal failure ([place]) 02/01/2017  . CKD (chronic kidney disease) stage 3, GFR 30-59 ml/min 11/08/2016  . HTN (hypertension) 10/03/2016  . History of CVA (cerebrovascular accident) 10/03/2016  . Pure hypercholesterolemia 10/03/2016  . Other male erectile dysfunction 06/27/2016  . Aortic atherosclerosis ([place]) 03/01/2016  . Vaccine counseling 12/30/2015      Prior to Admission medications   Medication Sig Start Date End Date Taking? Authorizing Provider  amLODipine (NORVASC) 5 MG tablet Take 5 mg by mouth daily.    [provider]  aspirin EC 325 MG tablet Take 325 mg by mouth daily.     [provider]  atorvastatin (LIPITOR) 10 MG tablet Take 10 mg by mouth at bedtime.     [provider]  nystatin (MYCOSTATIN/NYSTOP) powder Apply topically 4 (four) times daily as needed. 03/19/18   Hollice Espy, MD  Omega-3 Fatty Acids (FISH OIL PO) Take 1 capsule by mouth daily.    [provider]  senna (SENOKOT) 8.6 MG TABS tablet Take 1 tablet (8.6 mg total) by mouth 2 (two) times daily. 03/30/18   Vaughan Basta, MD    Allergies Morphine and related, Tramadol, and Sulfamethoxazole-trimethoprim    Social History Social History   Tobacco Use  .  Smoking status: Former Smoker    Types: Pipe  . Smokeless tobacco: Never Used  Substance Use Topics  . Alcohol use: Yes    Alcohol/week: 7.0 standard drinks    Types: 7 Glasses of wine per week  . Drug use: No    Review of Systems Patient denies headaches, rhinorrhea, blurry vision, numbness, shortness of breath, chest pain, edema, cough, abdominal pain, nausea, vomiting, diarrhea, dysuria, fevers, rashes or hallucinations unless otherwise stated above in  HPI. ____________________________________________   PHYSICAL EXAM:  VITAL SIGNS: Vitals:   06/21/19 0730 06/21/19 0830  BP: 122/62 (!) 115/55  Pulse: 89 78  Resp: (!) 25 20  Temp:    SpO2: 91% 94%    Constitutional: Alert, disoriented x3 Eyes: Conjunctivae are normal.  Head: Atraumatic. Nose: No congestion/rhinnorhea. Mouth/Throat: Mucous membranes are moist.   Neck: No stridor. Painless ROM.  Cardiovascular: Normal rate, regular rhythm. Grossly normal heart sounds.  Good peripheral circulation. Respiratory: Normal respiratory effort.  No retractions. Lungs CTAB. Gastrointestinal: Soft and nontender. No distention. No abdominal bruits. No CVA tenderness. Genitourinary: normal external genitalia Musculoskeletal: No lower extremity tenderness nor edema.  No joint effusions. Neurologic:  Normal speech and language. No gross focal neurologic deficits are appreciated. No facial droop Skin:  Skin is warm, dry and intact. No rash noted. Psychiatric calm and cooperative  ____________________________________________   LABS (all labs ordered are listed, but only abnormal results are displayed)  Results for orders placed or performed during the hospital encounter of 06/21/19 (from the past 24 hour(s))  Lactic acid, plasma     Status: Abnormal   Collection Time: 06/21/19  7:05 AM  Result Value Ref Range   Lactic Acid, Venous 2.9 (HH) 0.5 - 1.9 mmol/L  Comprehensive metabolic panel     Status: Abnormal   Collection Time: 06/21/19  7:05 AM  Result Value Ref Range   Sodium 142 135 - 145 mmol/L   Potassium 4.2 3.5 - 5.1 mmol/L   Chloride 112 (H) 98 - 111 mmol/L   CO2 20 (L) 22 - 32 mmol/L   Glucose, Bld 101 (H) 70 - 99 mg/dL   BUN 28 (H) 8 - 23 mg/dL   Creatinine, Ser 1.54 (H) 0.61 - 1.24 mg/dL   Calcium 9.0 8.9 - 10.3 mg/dL   Total Protein 6.3 (L) 6.5 - 8.1 g/dL   Albumin 3.6 3.5 - 5.0 g/dL   AST 38 15 - 41 U/L   ALT 16 0 - 44 U/L   Alkaline Phosphatase 57 38 - 126 U/L    Total Bilirubin 1.3 (H) 0.3 - 1.2 mg/dL   GFR calc non Af Amer 38 (L) >60 mL/min   GFR calc Af Amer 43 (L) >60 mL/min   Anion gap 10 5 - 15  CBC WITH DIFFERENTIAL     Status: Abnormal   Collection Time: 06/21/19  7:05 AM  Result Value Ref Range   WBC 21.3 (H) 4.0 - 10.5 K/uL   RBC 3.95 (L) 4.22 - 5.81 MIL/uL   Hemoglobin 12.6 (L) 13.0 - 17.0 g/dL   HCT 38.1 (L) 39.0 - 52.0 %   MCV 96.5 80.0 - 100.0 fL   MCH 31.9 26.0 - 34.0 pg   MCHC 33.1 30.0 - 36.0 g/dL   RDW 14.5 11.5 - 15.5 %   Platelets 135 (L) 150 - 400 K/uL   nRBC 0.0 0.0 - 0.2 %   Neutrophils Relative % 94 %   Neutro Abs 20.0 (H) 1.7 - 7.7 K/uL  Lymphocytes Relative 1 %   Lymphs Abs 0.3 (L) 0.7 - 4.0 K/uL   Monocytes Relative 3 %   Monocytes Absolute 0.7 0.1 - 1.0 K/uL   Eosinophils Relative 1 %   Eosinophils Absolute 0.1 0.0 - 0.5 K/uL   Basophils Relative 0 %   Basophils Absolute 0.1 0.0 - 0.1 K/uL   Immature Granulocytes 1 %   Abs Immature Granulocytes 0.18 (H) 0.00 - 0.07 K/uL  Urinalysis, Complete w Microscopic     Status: Abnormal   Collection Time: 06/21/19  7:05 AM  Result Value Ref Range   Color, Urine YELLOW YELLOW   APPearance HAZY (A) CLEAR   Specific Gravity, Urine 1.020 1.005 - 1.030   pH 5.0 5.0 - 8.0   Glucose, UA NEGATIVE NEGATIVE mg/dL   Hgb urine dipstick SMALL (A) NEGATIVE   Bilirubin Urine NEGATIVE NEGATIVE   Ketones, ur 15 (A) NEGATIVE mg/dL   Protein, ur 30 (A) NEGATIVE mg/dL   Nitrite POSITIVE (A) NEGATIVE   Leukocytes,Ua SMALL (A) NEGATIVE   Squamous Epithelial / LPF 0-5 0 - 5   WBC, UA 11-20 0 - 5 WBC/hpf   RBC / HPF 0-5 0 - 5 RBC/hpf   Bacteria, UA FEW (A) NONE SEEN   WBC Clumps PRESENT    Mucus PRESENT   Procalcitonin     Status: None   Collection Time: 06/21/19  7:05 AM  Result Value Ref Range   Procalcitonin 38.90 ng/mL  POC SARS Coronavirus 2 Ag     Status: None   Collection Time: 06/21/19  8:18 AM  Result Value Ref Range   SARS Coronavirus 2 Ag NEGATIVE NEGATIVE    ____________________________________________  EKG My review and personal interpretation at Time: 7:39   Indication: ams  Rate: 90  Rhythm: sinus Axis: normal Other: normal intervals, no stemi, poor r wave progression ____________________________________________  RADIOLOGY  I personally reviewed all radiographic images ordered to evaluate for the above acute complaints and reviewed radiology reports and findings.  These findings were personally discussed with the patient.  Please see medical record for radiology report.  ____________________________________________   PROCEDURES  Procedure(s) performed:  .Critical Care Performed by: Merlyn Lot, MD Authorized by: Merlyn Lot, MD   Critical care provider statement:    Critical care time (minutes):  40   Critical care time was exclusive of:  Separately billable procedures and treating other patients   Critical care was necessary to treat or prevent imminent or life-threatening deterioration of the following conditions:  Sepsis   Critical care was time spent personally by me on the following activities:  Development of treatment plan with patient or surrogate, discussions with consultants, evaluation of patient's response to treatment, examination of patient, obtaining history from patient or surrogate, ordering and performing treatments and interventions, ordering and review of laboratory studies, ordering and review of radiographic studies, pulse oximetry, re-evaluation of patient's condition and review of old charts      Critical Care performed: yes ____________________________________________   INITIAL IMPRESSION / [place] / ED COURSE  Pertinent labs & imaging results that were available during my care of the patient were reviewed by me and considered in my medical decision making (see chart for details).   DDX: Dehydration, sepsis, pna, uti, hypoglycemia, cva, drug effect, withdrawal,  encephalitis   Robert [name] is a 86 y.o. who presents to the ED with presentation as described above.  Rest warm to touch but normal core temperature.  Have a high suspicion  is he has underlying infectious process.  No meningismus.  His abdominal exam is soft and benign.  Blood work was done for the but differential.  Will provide IV fluids.  Clinical Course as of Jun 21 951  Fri Jun 21, 2019  0825 Lactate elevated urinalysis grossly infected.  White count elevated as well as Covid negative.  Does have a history of kidney stones.  Recent urine culture did grow out Enterococcus faecalis.  Will give Rocephin plus Vanco as he does not meet sepsis criteria and was Vang susceptible by culture results.  Will order CT imaging to exclude infected stone.   [PR]  [phone] CT shows evidence of distal left ureteral stone with mild obstruction.  Will consult urology.   [PR]  [phone] Case discussed with Dr. Bernardo Heater who plans to take patient for stenting.  Remains hemodynamically stable.  Patient discussed with hospitalist for admission.   [PR]    Clinical Course User Index [PR] Merlyn Lot, MD    The patient was evaluated in Emergency Department today for the symptoms described in the history of present illness. He/she was evaluated in the context of the global COVID-19 pandemic, which necessitated consideration that the patient might be at risk for infection with the SARS-CoV-2 virus that causes COVID-19. Institutional protocols and algorithms that pertain to the evaluation of patients at risk for COVID-19 are in a state of rapid change based on information released by regulatory bodies including the CDC and federal and state organizations. These policies and algorithms were followed during the patient's care in the ED.  As part of my medical decision making, I reviewed the following data within the [place] notes reviewed and incorporated, Labs reviewed, notes from prior ED visits  and [place] Controlled Substance Database   ____________________________________________   FINAL CLINICAL IMPRESSION(S) / ED DIAGNOSES  Final diagnoses:  Altered mental status, unspecified altered mental status type  Sepsis, due to unspecified organism, unspecified whether acute organ dysfunction present [facility])  Kidney stone      NEW MEDICATIONS STARTED DURING THIS VISIT:  New Prescriptions   No medications on file     Note:  This document was prepared using Dragon voice recognition software and may include unintentional dictation errors.    Merlyn Lot, MD 06/21/19 [phone]

## 2019-06-22 ENCOUNTER — Inpatient Hospital Stay: Payer: Medicare PPO

## 2019-06-22 DIAGNOSIS — A4151 Sepsis due to Escherichia coli [E. coli]: Principal | ICD-10-CM

## 2019-06-22 DIAGNOSIS — R7881 Bacteremia: Secondary | ICD-10-CM

## 2019-06-22 DIAGNOSIS — N201 Calculus of ureter: Secondary | ICD-10-CM

## 2019-06-22 DIAGNOSIS — N170 Acute kidney failure with tubular necrosis: Secondary | ICD-10-CM

## 2019-06-22 DIAGNOSIS — B9620 Unspecified Escherichia coli [E. coli] as the cause of diseases classified elsewhere: Secondary | ICD-10-CM

## 2019-06-22 DIAGNOSIS — N132 Hydronephrosis with renal and ureteral calculous obstruction: Secondary | ICD-10-CM

## 2019-06-22 LAB — CBC
HCT: 36 % — ABNORMAL LOW (ref 39.0–52.0)
Hemoglobin: 12 g/dL — ABNORMAL LOW (ref 13.0–17.0)
MCH: 32.9 pg (ref 26.0–34.0)
MCHC: 33.3 g/dL (ref 30.0–36.0)
MCV: 98.6 fL (ref 80.0–100.0)
Platelets: 102 10*3/uL — ABNORMAL LOW (ref 150–400)
RBC: 3.65 MIL/uL — ABNORMAL LOW (ref 4.22–5.81)
RDW: 14.7 % (ref 11.5–15.5)
WBC: 8.8 10*3/uL (ref 4.0–10.5)
nRBC: 0 % (ref 0.0–0.2)

## 2019-06-22 LAB — BASIC METABOLIC PANEL
Anion gap: 7 (ref 5–15)
BUN: 27 mg/dL — ABNORMAL HIGH (ref 8–23)
CO2: 22 mmol/L (ref 22–32)
Calcium: 7.8 mg/dL — ABNORMAL LOW (ref 8.9–10.3)
Chloride: 114 mmol/L — ABNORMAL HIGH (ref 98–111)
Creatinine, Ser: 1.37 mg/dL — ABNORMAL HIGH (ref 0.61–1.24)
GFR calc Af Amer: 50 mL/min — ABNORMAL LOW (ref >60)
GFR calc non Af Amer: 43 mL/min — ABNORMAL LOW (ref >60)
Glucose, Bld: 94 mg/dL (ref 70–99)
Potassium: 4.1 mmol/L (ref 3.5–5.1)
Sodium: 143 mmol/L (ref 135–145)

## 2019-06-22 LAB — PROTIME-INR
INR: 1.2 (ref 0.8–1.2)
Prothrombin Time: 15.3 s — ABNORMAL HIGH (ref 11.4–15.2)

## 2019-06-22 LAB — CORTISOL-AM, BLOOD: Cortisol - AM: 20.6 ug/dL (ref 6.7–22.6)

## 2019-06-22 LAB — PROCALCITONIN: Procalcitonin: 27.72 ng/mL

## 2019-06-22 MED ORDER — ENOXAPARIN SODIUM 40 MG/0.4ML ~~LOC~~ SOLN
40.0000 mg | SUBCUTANEOUS | Status: DC
  Administered 2019-06-22 – 2019-06-27 (×6): 40 mg via SUBCUTANEOUS
  Filled 2019-06-22 (×6): qty 0.4

## 2019-06-22 MED ORDER — SODIUM CHLORIDE 0.9 % IV SOLN
1000.0000 mL | INTRAVENOUS | Status: DC
  Administered 2019-06-22: 1000 mL via INTRAVENOUS

## 2019-06-22 MED ORDER — AMLODIPINE BESYLATE 5 MG PO TABS
5.0000 mg | ORAL_TABLET | Freq: Every day | ORAL | Status: DC
  Administered 2019-06-22 – 2019-06-28 (×7): 5 mg via ORAL
  Filled 2019-06-22 (×7): qty 1

## 2019-06-22 NOTE — Progress Notes (Signed)
Urology Consult Follow Up  Subjective: No complaints, VSS/afebrile 3/4 blood cultures positive E. coli  Anti-infectives: Anti-infectives (From admission, onward)   Start     Dose/Rate Route Frequency Ordered Stop   06/22/19 1000  ceFEPIme (MAXIPIME) 2 g in sodium chloride 0.9 % 100 mL IVPB  Status:  Discontinued     2 g 200 mL/hr over 30 Minutes Intravenous Every 24 hours 06/21/19 1036 06/21/19 2213   06/21/19 2215  meropenem (MERREM) 1 g in sodium chloride 0.9 % 100 mL IVPB     1 g 200 mL/hr over 30 Minutes Intravenous Every 12 hours 06/21/19 2213     06/21/19 1100  vancomycin (VANCOREADY) IVPB 750 mg/150 mL     750 mg 150 mL/hr over 60 Minutes Intravenous  Once 06/21/19 1028 06/21/19 1208   06/21/19 1015  ceFEPIme (MAXIPIME) 2 g in sodium chloride 0.9 % 100 mL IVPB     2 g 200 mL/hr over 30 Minutes Intravenous  Once 06/21/19 1013 06/21/19 1115   06/21/19 0830  cefTRIAXone (ROCEPHIN) 1 g in sodium chloride 0.9 % 100 mL IVPB     1 g 200 mL/hr over 30 Minutes Intravenous  Once 06/21/19 0820 06/21/19 0924   06/21/19 0830  vancomycin (VANCOCIN) IVPB 1000 mg/200 mL premix     1,000 mg 200 mL/hr over 60 Minutes Intravenous STAT 06/21/19 0829 06/21/19 1022      Current Facility-Administered Medications  Medication Dose Route Frequency Provider Last Rate Last Admin  . 0.9 %  sodium chloride infusion  1,000 mL Intravenous Continuous Agbata, Tochukwu, MD 125 mL/hr at 06/22/19 0920 1,000 mL at 06/22/19 0920  . acetaminophen (TYLENOL) tablet 650 mg  650 mg Oral Q6H PRN Agbata, Tochukwu, MD       Or  . acetaminophen (TYLENOL) suppository 650 mg  650 mg Rectal Q6H PRN Agbata, Tochukwu, MD      . aspirin EC tablet 81 mg  81 mg Oral Daily Agbata, Tochukwu, MD   81 mg at 06/21/19 1712  . atorvastatin (LIPITOR) tablet 10 mg  10 mg Oral QHS Agbata, Tochukwu, MD   10 mg at 06/21/19 2139  . Chlorhexidine Gluconate Cloth 2 % PADS 6 each  6 each Topical Daily Agbata, Tochukwu, MD   6 each at 06/22/19  0916  . meropenem (MERREM) 1 g in sodium chloride 0.9 % 100 mL IVPB  1 g Intravenous Q12H Lang Snow, NP 200 mL/hr at 06/22/19 0918 1 g at 06/22/19 [id number]  . multivitamin with minerals tablet 1 tablet  1 tablet Oral Daily Agbata, Tochukwu, MD   1 tablet at 06/22/19 0915  . omega-3 acid ethyl esters (LOVAZA) capsule 2 g  2 g Oral Daily Agbata, Tochukwu, MD   2 g at 06/22/19 0915  . ondansetron (ZOFRAN) tablet 4 mg  4 mg Oral Q6H PRN Agbata, Tochukwu, MD       Or  . ondansetron (ZOFRAN) injection 4 mg  4 mg Intravenous Q6H PRN Agbata, Tochukwu, MD   4 mg at 06/21/19 1232  . senna (SENOKOT) tablet 8.6 mg  1 tablet Oral BID Agbata, Tochukwu, MD   8.6 mg at 06/22/19 0915  . sodium chloride flush (NS) 0.9 % injection 3 mL  3 mL Intravenous Q12H Agbata, Tochukwu, MD   3 mL at 06/22/19 0916  . tamsulosin (FLOMAX) capsule 0.4 mg  0.4 mg Oral Daily Agbata, Tochukwu, MD   0.4 mg at 06/22/19 0915     Objective: Vital signs in last  24 hours: Temp:  [97.6 F (36.4 C)-98.5 F (36.9 C)] 98.3 F (36.8 C) (03/06 0842) Pulse Rate:  [71-95] 71 (03/06 0842) Resp:  [13-27] 16 (03/06 0618) BP: (97-136)/(54-83) 136/68 (03/06 0842) SpO2:  [91 %-98 %] 92 % (03/06 0842) Weight:  [87.1 kg-89.1 kg] 89.1 kg (03/06 0618)  Intake/Output from previous day: 03/05 0701 - 03/06 0700 In: 3169 [P.O.:480; I.V.:2289; IV Piggyback:400] Out: 975 [Urine:975] Intake/Output this shift: No intake/output data recorded.   Physical Exam: Alert, eating. Abdomen soft, nontender Foley catheter draining clear urine  Lab Results:  Recent Labs    06/21/19 0705 06/22/19 0529  WBC 21.3* 8.8  HGB 12.6* 12.0*  HCT 38.1* 36.0*  PLT 135* 102*   BMET Recent Labs    06/21/19 0705 06/22/19 0529  NA 142 143  K 4.2 4.1  CL 112* 114*  CO2 20* 22  GLUCOSE 101* 94  BUN 28* 27*  CREATININE 1.54* 1.37*  CALCIUM 9.0 7.8*   PT/INR Recent Labs    06/22/19 0529  LABPROT 15.3*  INR 1.2      Assessment: -Sepsis/gram-negative bacteremia secondary to obstructing distal ureteral calculus -Clinically improved status post stent placement  s/p Procedure(s): CYSTOSCOPY WITH STENT PLACEMENT CYSTOSCOPY WITH RETROGRADE PYELOGRAM  Recommendation: -KUB today -DC Foley in a.m. if continued clinical improvement   Robert [name] 06/22/2019

## 2019-06-22 NOTE — Evaluation (Addendum)
Clinical/Bedside Swallow Evaluation Patient Details  Name: Robert [name] MRN: [id number] Date of Birth: [birth year]  Today's Date: 06/22/2019 Time: SLP Start Time (ACUTE ONLY): [id number] SLP Stop Time (ACUTE ONLY): 1020 SLP Time Calculation (min) (ACUTE ONLY): 51 min  Past Medical History:  Past Medical History:  Diagnosis Date  . Aortic atherosclerosis ([place]) 03/01/2016  . Arthritis   . 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 High cholesterol   . History of kidney stones   . Hypertension   . TIA (transient ischemic attack)   . 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 CYSTOSCOPY W/ RETROGRADES Left 03/23/2018   Procedure: CYSTOSCOPY WITH RETROGRADE PYELOGRAM;  Surgeon: Billey Co, MD;  Location: [facility];  Service: Urology;  Laterality: Left;  . CYSTOSCOPY WITH STENT PLACEMENT Left 02/20/2018   Procedure: CYSTOSCOPY WITH STENT PLACEMENT;  Surgeon: Billey Co, MD;  Location: [facility];  Service: Urology;  Laterality: Left;  . CYSTOSCOPY/URETEROSCOPY/HOLMIUM LASER/STENT PLACEMENT Left 03/23/2018   Procedure: CYSTOSCOPY/URETEROSCOPY/HOLMIUM LASER/STENT Exchange;  Surgeon: Billey Co, MD;  Location: [facility];  Service: Urology;  Laterality: Left;  left stent exchange  . LEG SURGERY     broken tib/fib  . TOTAL HIP ARTHROPLASTY Bilateral    HPI:  Pt is a 86 y.o. 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  Patient was brought in this morning for evaluation of mental status changes which the wife noted 1 day prior to his admission.  She states that patient complained of not feeling well and had nausea but no vomiting.  He has a history of low back pain but the wife states that the pain got worse the day prior to his admission and they thought it was exacerbated from physical therapy that he had earlier in the day.  At baseline  patient is normally awake, alert and oriented x 2 but on admission he is only oriented to person.  Labs revealed marked leukocytosis, 20K with a left shift, he has pyuria and lactate is elevated at 2.9.  CT scan renal study showed 4 mm calculus adjacent to the left ureterovesicular junction with very mild proximal left hydroureteronephrosis indicative of mild obstruction.  Chest x-ray does not show any acute findings.  Pt admitted w/ dx of metabolic encephaloapathy; CT showed mild left-sided hydronephrosis from an obstructing stone.  Unsure of pt's baseline Cognitive status - noted decreased orientation per Wife's report.    Assessment / Plan / Recommendation Clinical Impression  Pt appears to present w/ oropharyngeal phase dysphagia w/ trials given at this evaluation today; pt appears at increased risk for aspiration and would benefit from a dysphagia diet w/ aspiration precautions. Pt presented at baseline w/ weakness, needed cues for follow through w/ tasks - unsure of pt's Baseline Cognitive status. HOH w/ HAs charging currently. Pt positioned fully upright (needed full support) and presented w/ trials of ice chips, thin liquids via Cup, Nectar liquids, and purees/ soft solids. Delayed, overt coughing (congested) noted w/ trials of thin liquids. Also noted was oral holding(>15 seconds), prolonged A-P transfer. W/ trials of Nectar liquids and purees/soft solids, no overt coughing or wet vocal quality b/t trials; no decline in respiratory status noted to occur w/ these trial consistencies. Oral phase for all trial consistencies was c/b deficits of prolonged bolus management, oral holding(>15 seconds), prolonged A-P transfer.  Time given w/ all trials; visual cue of tsp of next bolus presented to encourage swallowing. Oral clearing was checked b/t trials. Trials of increased texture appeared most timely -- increased sensation from the texture suspected. Most beneficial was Time w/ each trial, alternating  foods/liquids. Oral clearing was appropriate w/ all trials. Belching noted during intake/trials. Pt helped to feed self but needed support d/t overall weakness. Cues to redirect to tasks intermittently. OM exam appeared grossly WFL; no unilateral lingual/labial weakness noted. Recommend a Mech soft diet(meats well-cut) w/ Nectar liquids; aspiration precautions; Pills in Puree for safer, easier swallowing. Feeding Support at meals. ST services will continue to monitor pt's toleration of diet w/ trials to further assess thin liquids; possible MBSS. MD/NSG updated and will monitor as well.  SLP Visit Diagnosis: Dysphagia, oropharyngeal phase (R13.12)    Aspiration Risk  Moderate aspiration risk;Risk for inadequate nutrition/hydration(reduced when following precs)    Diet Recommendation  Dysphagia level 3 (mech soft w/ well-cut meats, gravy); Nectar consistency liquids. Aspiration precautions. Feeding support at meals. Reduce distractions and check for oral clearing w/ each bite/sip.  Medication Administration: Whole meds with puree(vs Crushed as needed for safer swallowing)    Other  Recommendations Recommended Consults: (Dietician f/u; Palliative Care for f/u w/ GOC) Oral Care Recommendations: Oral care BID;Oral care before and after PO;Staff/trained caregiver to provide oral care Other Recommendations: Order thickener from pharmacy;Prohibited food (jello, ice cream, thin soups);Remove water pitcher;Have oral suction available   Follow up Recommendations (TBD)      Frequency and Duration min 3x week  2 weeks       Prognosis Prognosis for Safe Diet Advancement: Fair Barriers to Reach Goals: Cognitive deficits;Time post onset;Severity of deficits;Behavior Barriers/Prognosis Comment: oral holding      Swallow Study   General Date of Onset: 06/21/19 HPI: Pt is a 86 y.o. 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  Patient was brought in this morning for evaluation of mental status changes which the wife noted 1 day prior to his admission.  She states that patient complained of not feeling well and had nausea but no vomiting.  He has a history of low back pain but the wife states that the pain got worse the day prior to his admission and they thought it was exacerbated from physical therapy that he had earlier in the day.  At baseline patient is normally awake, alert and oriented x 2 but on admission he is only oriented to person.  Labs revealed marked leukocytosis, 20K with a left shift, he has pyuria and lactate is elevated at 2.9.  CT scan renal study showed 4 mm calculus adjacent to the left ureterovesicular junction with very mild proximal left hydroureteronephrosis indicative of mild obstruction.  Chest x-ray does not show any acute findings.  Pt admitted w/ dx of metabolic encephaloapathy; CT showed mild left-sided hydronephrosis from an obstructing stone.  Unsure of pt's baseline Cognitive status - noted decreased orientation per Wife's report.  Type of Study: Bedside Swallow Evaluation Previous Swallow Assessment: none Diet Prior to this Study: NPO(regular diet at home per pt) Temperature Spikes Noted: No(wbc 8.8) Respiratory Status: Nasal cannula(3L) History of Recent Intubation: No Behavior/Cognition: Alert;Cooperative;Pleasant mood;Distractible;Requires cueing(HOH) Oral Cavity Assessment: Dry(sticky) Oral Care Completed by SLP: Yes Oral Cavity - Dentition: Adequate natural dentition Vision: Functional for self-feeding Self-Feeding Abilities: Able to feed self;Needs assist;Needs set up;Total assist Patient Positioning: Upright in bed(needed positioning support) Baseline  Vocal Quality: Normal;Low vocal intensity(adequate) Volitional Cough: Strong Volitional Swallow: Able to elicit    Oral/Motor/Sensory Function Overall Oral Motor/Sensory Function: Within functional limits   Ice  Chips Ice chips: Within functional limits Presentation: Spoon(fed; 4 trials)   Thin Liquid Thin Liquid: Impaired Presentation: Cup;Self Fed(supported; 5 trials) Oral Phase Impairments: Poor awareness of bolus Oral Phase Functional Implications: Prolonged oral transit;Oral holding Pharyngeal  Phase Impairments: Cough - Delayed(x5/5 trials)    Nectar Thick Nectar Thick Liquid: Impaired Presentation: Cup;Self Fed;Straw(supported; ~3-4 ozs) Oral Phase Impairments: Poor awareness of bolus Oral phase functional implications: Oral holding;Prolonged oral transit Pharyngeal Phase Impairments: (None)   Honey Thick Honey Thick Liquid: Not tested   Puree Puree: Impaired Presentation: Spoon(fed; 8 trials) Oral Phase Impairments: Poor awareness of bolus Oral Phase Functional Implications: Prolonged oral transit;Oral holding Pharyngeal Phase Impairments: (None)   Solid     Solid: Impaired Presentation: Spoon(fed; 6 trials) Oral Phase Impairments: Impaired mastication Oral Phase Functional Implications: Prolonged oral transit;Oral holding Pharyngeal Phase Impairments: (None) Other Comments: min less oral holding than w/ liquids, puree        [name], MS, CCC-SLP [facility] 06/22/2019,1:59 PM

## 2019-06-22 NOTE — Progress Notes (Addendum)
PROGRESS NOTE                                                                                                                                                                                                             Patient Demographics:    Robert [name], is a 86 y.o. male, DOB - [birth year], HQP:[id number]  Admit date - 06/21/2019   Admitting Physician Collier Bullock, MD  Outpatient Primary MD for the patient is Dion Body, MD  LOS - 1    Chief Complaint  Patient presents with  . Altered Mental Status       Brief Narrative 86 year old male with history of CVA, TIAs, hypertension and nephrolithiasis presented with acute change in his mental status of 1 day duration.  Patient having poor p.o. intake and nausea with low back pain, subsequently developed chills but no fever. In the ED he has significant leukocytosis with WBC of 20 K, UA Sater UTI, elevated lactic acid of 2.9.  Patient met criteria for sepsis.  CT scan with renal study showed 4 mm calculus at this into the left UPJ with mild proximal left hydroureteronephrosis. Patient given IV fluids.  Sepsis pathway initiated.  Empiric antibiotic started and urology consulted. Patient taken to the OR for cystoscopy with retrograde pyelogram and stent placement.   Subjective:   Patient appears more oriented today.  Denies any pain or nausea.  Reports poor appetite.   Assessment  & Plan :    Principal Problem: Severe sepsis ([place]) Secondary to UTI with bacteremia. Blood culture 3/4 growing GNR, E. Coli/E faecalis.  Switch cefepime to meropenem.  Follow sensitivity.  Sepsis currently resolved.  Supportive care with Tylenol and antiemetic.   Active problems   Hydronephrosis with urinary obstruction due to ureteral calculus S/p retrograde pyelogram and left ureteral stent placement.  Remove Foley in a.m.  Urology following.  Acute kidney injury Likely  combination of ATN with sepsis and postobstructive.  Monitor with hydration.  Avoid nephrotoxins.    Acute metabolic encephalopathy Secondary to sepsis.  Resolved.  Essential hypertension Resume amlodipine.  History of CVA Continue aspirin and statin.     Code Status : Full code.  Discussed in detail with patient and his wife and patient wants to have full scope of treatment.  Family Communication  : Wife at bedside  Disposition Plan  :  Home possibly in the next 48 hours if no longer septic, AKI resolved and pending final blood culture and sensitivity.  Barriers For Discharge : Active symptoms (sepsis, bacteremia)  Consults  : Urology  Procedures  : CT renal study, left ureteral stent  DVT Prophylaxis  :  Lovenox -   Lab Results  Component Value Date   PLT 102 (L) 06/22/2019    Antibiotics  :    Anti-infectives (From admission, onward)   Start     Dose/Rate Route Frequency Ordered Stop   06/22/19 1000  ceFEPIme (MAXIPIME) 2 g in sodium chloride 0.9 % 100 mL IVPB  Status:  Discontinued     2 g 200 mL/hr over 30 Minutes Intravenous Every 24 hours 06/21/19 1036 06/21/19 2213   06/21/19 2215  meropenem (MERREM) 1 g in sodium chloride 0.9 % 100 mL IVPB     1 g 200 mL/hr over 30 Minutes Intravenous Every 12 hours 06/21/19 2213     06/21/19 1100  vancomycin (VANCOREADY) IVPB 750 mg/150 mL     750 mg 150 mL/hr over 60 Minutes Intravenous  Once 06/21/19 1028 06/21/19 1208   06/21/19 1015  ceFEPIme (MAXIPIME) 2 g in sodium chloride 0.9 % 100 mL IVPB     2 g 200 mL/hr over 30 Minutes Intravenous  Once 06/21/19 1013 06/21/19 1115   06/21/19 0830  cefTRIAXone (ROCEPHIN) 1 g in sodium chloride 0.9 % 100 mL IVPB     1 g 200 mL/hr over 30 Minutes Intravenous  Once 06/21/19 0820 06/21/19 0924   06/21/19 0830  vancomycin (VANCOCIN) IVPB 1000 mg/200 mL premix     1,000 mg 200 mL/hr over 60 Minutes Intravenous STAT 06/21/19 0829 06/21/19 1022        Objective:   Vitals:    06/21/19 2021 06/22/19 0618 06/22/19 0842 06/22/19 1141  BP: 123/69 127/83 136/68 123/63  Pulse: 77 87 71 65  Resp: 18 16    Temp: 98.4 F (36.9 C) 98.2 F (36.8 C) 98.3 F (36.8 C) 97.8 F (36.6 C)  TempSrc: Oral Oral Oral Oral  SpO2: 95% 91% 92% 92%  Weight:  89.1 kg    Height:        Wt Readings from Last 3 Encounters:  06/22/19 89.1 kg  01/28/19 89.8 kg  04/03/18 92.1 kg     Intake/Output Summary (Last 24 hours) at 06/22/2019 1323 Last data filed at 06/22/2019 9371 Gross per 24 hour  Intake 2368.99 ml  Output 975 ml  Net 1393.99 ml     Physical Exam  Gen: Elderly male, not in distress, fatigued HEENT: No pallor, moist mucosa, supple neck Chest: Clear bilaterally CVs: Normal S1-S2 GI: Soft, nondistended, nontender, no CVA tenderness, Foley + Musculoskeletal: Warm, no edema    Data Review:    CBC Recent Labs  Lab 06/21/19 0705 06/22/19 0529  WBC 21.3* 8.8  HGB 12.6* 12.0*  HCT 38.1* 36.0*  PLT 135* 102*  MCV 96.5 98.6  MCH 31.9 32.9  MCHC 33.1 33.3  RDW 14.5 14.7  LYMPHSABS 0.3*  --   MONOABS 0.7  --   EOSABS 0.1  --   BASOSABS 0.1  --     Chemistries  Recent Labs  Lab 06/21/19 0705 06/22/19 0529  NA 142 143  K 4.2 4.1  CL 112* 114*  CO2 20* 22  GLUCOSE 101* 94  BUN 28* 27*  CREATININE 1.54* 1.37*  CALCIUM 9.0 7.8*  AST 38  --   ALT 16  --  ALKPHOS 57  --   BILITOT 1.3*  --    ------------------------------------------------------------------------------------------------------------------ No results for input(s): CHOL, HDL, LDLCALC, TRIG, CHOLHDL, LDLDIRECT in the last 72 hours.  No results found for: HGBA1C ------------------------------------------------------------------------------------------------------------------ No results for input(s): TSH, T4TOTAL, T3FREE, THYROIDAB in the last 72 hours.  Invalid input(s):  FREET3 ------------------------------------------------------------------------------------------------------------------ No results for input(s): VITAMINB12, FOLATE, FERRITIN, TIBC, IRON, RETICCTPCT in the last 72 hours.  Coagulation profile Recent Labs  Lab 06/22/19 0529  INR 1.2    No results for input(s): DDIMER in the last 72 hours.  Cardiac Enzymes No results for input(s): CKMB, TROPONINI, MYOGLOBIN in the last 168 hours.  Invalid input(s): CK ------------------------------------------------------------------------------------------------------------------ No results found for: BNP  Inpatient Medications  Scheduled Meds: . aspirin EC  81 mg Oral Daily  . atorvastatin  10 mg Oral QHS  . Chlorhexidine Gluconate Cloth  6 each Topical Daily  . multivitamin with minerals  1 tablet Oral Daily  . omega-3 acid ethyl esters  2 g Oral Daily  . senna  1 tablet Oral BID  . sodium chloride flush  3 mL Intravenous Q12H  . tamsulosin  0.4 mg Oral Daily   Continuous Infusions: . sodium chloride 1,000 mL (06/22/19 0920)  . meropenem (MERREM) IV 1 g (06/22/19 0918)   PRN Meds:.acetaminophen **OR** acetaminophen, ondansetron **OR** ondansetron (ZOFRAN) IV  Micro Results Recent Results (from the past 240 hour(s))  Blood Culture (routine x 2)     Status: None (Preliminary result)   Collection Time: 06/21/19  7:05 AM   Specimen: BLOOD  Result Value Ref Range Status   Specimen Description BLOOD L FARM  Final   Special Requests   Final    BOTTLES DRAWN AEROBIC AND ANAEROBIC Blood Culture adequate volume   Culture  Setup Time   Final    Organism ID to follow IN BOTH AEROBIC AND ANAEROBIC BOTTLES GRAM NEGATIVE RODS CRITICAL RESULT CALLED TO, READ BACK BY AND VERIFIED WITH: LISA KLUTTZ ON 06/21/19 AT 2006 [facility] Performed at [facility], [address], [place], [place] [address]    Culture GRAM NEGATIVE RODS  Final   Report Status PENDING  Incomplete  Blood Culture (routine  x 2)     Status: None (Preliminary result)   Collection Time: 06/21/19  7:05 AM   Specimen: BLOOD  Result Value Ref Range Status   Specimen Description BLOOD R AC  Final   Special Requests   Final    BOTTLES DRAWN AEROBIC AND ANAEROBIC Blood Culture adequate volume   Culture  Setup Time   Final    IN BOTH AEROBIC AND ANAEROBIC BOTTLES GRAM NEGATIVE RODS CRITICAL VALUE NOTED.  VALUE IS CONSISTENT WITH PREVIOUSLY REPORTED AND CALLED VALUE. Performed at [facility], [address] [place], [place] [address]    Culture GRAM NEGATIVE RODS  Final   Report Status PENDING  Incomplete  Urine culture     Status: Abnormal (Preliminary result)   Collection Time: 06/21/19  7:05 AM   Specimen: In/Out Cath Urine  Result Value Ref Range Status   Specimen Description   Final    IN/OUT CATH URINE Performed at [facility], [address], [place], [place] [address]    Special Requests   Final    NONE Performed at [facility], [address] [place], [place] [address]    Culture >=100,000 COLONIES/mL GRAM NEGATIVE RODS (A)  Final   Report Status PENDING  Incomplete  Blood Culture ID Panel (Reflexed)  Status: Abnormal   Collection Time: 06/21/19  7:05 AM  Result Value Ref Range Status   Enterococcus species NOT DETECTED NOT DETECTED Final   Listeria monocytogenes NOT DETECTED NOT DETECTED Final   Staphylococcus species NOT DETECTED NOT DETECTED Final   Staphylococcus aureus (BCID) NOT DETECTED NOT DETECTED Final   Streptococcus species NOT DETECTED NOT DETECTED Final   Streptococcus agalactiae NOT DETECTED NOT DETECTED Final   Streptococcus pneumoniae NOT DETECTED NOT DETECTED Final   Streptococcus pyogenes NOT DETECTED NOT DETECTED Final   Acinetobacter baumannii NOT DETECTED NOT DETECTED Final   Enterobacteriaceae species DETECTED (A) NOT DETECTED Final    Comment: Enterobacteriaceae represent a large family of gram-negative bacteria, not a single  organism. CRITICAL RESULT CALLED TO, READ BACK BY AND VERIFIED WITH: LISA KLUTTZ ON 06/21/19 AT 2006 [facility]    Enterobacter cloacae complex NOT DETECTED NOT DETECTED Final   Escherichia coli DETECTED (A) NOT DETECTED Final    Comment: CRITICAL RESULT CALLED TO, READ BACK BY AND VERIFIED WITH: LISA KLUTTZ ON 06/21/19 AT 2006 [facility]    Klebsiella oxytoca NOT DETECTED NOT DETECTED Final   Klebsiella pneumoniae NOT DETECTED NOT DETECTED Final   Proteus species NOT DETECTED NOT DETECTED Final   Serratia marcescens NOT DETECTED NOT DETECTED Final   Carbapenem resistance NOT DETECTED NOT DETECTED Final   Haemophilus influenzae NOT DETECTED NOT DETECTED Final   Neisseria meningitidis NOT DETECTED NOT DETECTED Final   Pseudomonas aeruginosa NOT DETECTED NOT DETECTED Final   Candida albicans NOT DETECTED NOT DETECTED Final   Candida glabrata NOT DETECTED NOT DETECTED Final   Candida krusei NOT DETECTED NOT DETECTED Final   Candida parapsilosis NOT DETECTED NOT DETECTED Final   Candida tropicalis NOT DETECTED NOT DETECTED Final    Comment: Performed at [facility], [address] [place], [place] [address]  Respiratory Panel by RT PCR (Flu A&B, Covid) - Nasopharyngeal Swab     Status: None   Collection Time: 06/21/19  9:44 AM   Specimen: Nasopharyngeal Swab  Result Value Ref Range Status   SARS Coronavirus 2 by RT PCR NEGATIVE NEGATIVE Final    Comment: (NOTE) SARS-CoV-2 target nucleic acids are NOT DETECTED. The SARS-CoV-2 RNA is generally detectable in upper respiratoy specimens during the acute phase of infection. The lowest concentration of SARS-CoV-2 viral copies this assay can detect is 131 copies/mL. A negative result does not preclude SARS-Cov-2 infection and should not be used as the sole basis for treatment or other patient management decisions. A negative result may occur with  improper specimen collection/handling, submission of specimen other than nasopharyngeal swab,  presence of viral mutation(s) within the areas targeted by this assay, and inadequate number of viral copies (<131 copies/mL). A negative result must be combined with clinical observations, patient history, and epidemiological information. The expected result is Negative. Fact Sheet for Patients:  PinkCheek.be Fact Sheet for Healthcare Providers:  GravelBags.it This test is not yet ap proved or cleared by the [place] FDA and  has been authorized for detection and/or diagnosis of SARS-CoV-2 by FDA under an Emergency Use Authorization (EUA). This EUA will remain  in effect (meaning this test can be used) for the duration of the COVID-19 declaration under Section 564(b)(1) of the Act, 21 U.S.C. section 360bbb-3(b)(1), unless the authorization is terminated or revoked sooner.    Influenza A by PCR NEGATIVE NEGATIVE Final   Influenza B by PCR NEGATIVE NEGATIVE Final    Comment: (NOTE) The Xpert Xpress SARS-CoV-2/FLU/RSV assay is  intended as an aid in  the diagnosis of influenza from Nasopharyngeal swab specimens and  should not be used as a sole basis for treatment. Nasal washings and  aspirates are unacceptable for Xpert Xpress SARS-CoV-2/FLU/RSV  testing. Fact Sheet for Patients: PinkCheek.be Fact Sheet for Healthcare Providers: GravelBags.it This test is not yet approved or cleared by the [place] FDA and  has been authorized for detection and/or diagnosis of SARS-CoV-2 by  FDA under an Emergency Use Authorization (EUA). This EUA will remain  in effect (meaning this test can be used) for the duration of the  Covid-19 declaration under Section 564(b)(1) of the Act, 21  U.S.C. section 360bbb-3(b)(1), unless the authorization is  terminated or revoked. Performed at [facility], [address] [place], [place] [address]     Radiology Reports DG  Chest [place] 1 View  Result Date: 06/21/2019 CLINICAL DATA:  Altered mental status EXAM: PORTABLE CHEST 1 VIEW COMPARISON:  02/19/2018 chest radiograph. FINDINGS: Stable cardiomediastinal silhouette with normal heart size. No pneumothorax. No significant pleural effusions. No pulmonary edema. Mild bibasilar scarring versus atelectasis, unchanged. No acute consolidative airspace disease. IMPRESSION: Stable mild bibasilar scarring versus atelectasis. Electronically Signed   By: Ilona Sorrel M.D.   On: 06/21/2019 08:00   DG OR UROLOGY CYSTO IMAGE ([facility] ONLY)  Result Date: 06/21/2019 There is no interpretation for this exam.  This order is for images obtained during a surgical procedure.  Please See "Surgeries" Tab for more information regarding the procedure.   CT Renal Stone Study  Result Date: 06/21/2019 CLINICAL DATA:  86 year old male with history of left-sided flank pain. Suspected kidney stones. EXAM: CT ABDOMEN AND PELVIS WITHOUT CONTRAST TECHNIQUE: Multidetector CT imaging of the abdomen and pelvis was performed following the standard protocol without IV contrast. COMPARISON:  CT the abdomen and pelvis 02/19/2018. FINDINGS: Lower chest: Mild chronic scarring in the left lower lobe, similar to the prior study. Aortic atherosclerosis. Calcifications of the aortic valve and mitral annulus. Hepatobiliary: No definite cystic or solid hepatic lesions are confidently identified on today's noncontrast CT examination. Calcified gallstones are noted in the gallbladder measuring up to 2.2 cm in diameter. No findings to suggest an acute cholecystitis at this time. Pancreas: No definite pancreatic mass or peripancreatic fluid collections or inflammatory changes are noted on today's noncontrast CT examination. Spleen: Unremarkable. Adrenals/Urinary Tract: Nonobstructive calculi are noted within the collecting systems of both kidneys, largest of which is in the lower pole of the right kidney measuring 1.1 cm in diameter.  In addition, there is a 4 mm calculus adjacent to the left ureterovesicular junction in the distal third of the left ureter (axial image 77 of series 2). This is associated with minimal proximal left hydroureteronephrosis indicative of very mild obstruction. Unenhanced appearance of the kidneys is otherwise unremarkable. Urinary bladder is normal in appearance. Bilateral adrenal glands are normal in appearance. Stomach/Bowel: Unenhanced appearance of the stomach is normal. No pathologic dilatation of small bowel or colon. Normal appendix. Vascular/Lymphatic: Aortic atherosclerosis. No lymphadenopathy noted in the abdomen or pelvis. Reproductive: Prostate gland and seminal vesicles are poorly visualized secondary to beam hardening artifact from the patient's bilateral hip arthroplasties. Other: No significant volume of ascites.  No pneumoperitoneum. Musculoskeletal: Status post bilateral hip arthroplasty. There are no aggressive appearing lytic or blastic lesions noted in the visualized portions of the skeleton. IMPRESSION: 1. 4 mm calculus adjacent to the left ureterovesicular junction with very mild proximal left hydroureteronephrosis indicative of very mild obstruction. 2. Other  nonobstructive calculi in the collecting systems of the kidneys bilaterally measuring up to 1.1 cm in diameter in the lower pole collecting system of the right kidney. 3. Cholelithiasis without evidence of acute cholecystitis at this time. 4. Aortic atherosclerosis. 5. There are calcifications of the aortic valve and mitral annulus. Echocardiographic correlation for evaluation of potential valvular dysfunction may be warranted if clinically indicated. Electronically Signed   By: Vinnie Langton M.D.   On: 06/21/2019 09:11    Time Spent in minutes 35   Rahim Astorga M.D on 06/22/2019 at 1:23 PM  Between 7am to 7pm - Pager - [phone]  After 7pm go to [email or web address] - password [facility]  [facility] -  Office   [phone]

## 2019-06-23 ENCOUNTER — Inpatient Hospital Stay: Payer: Medicare PPO

## 2019-06-23 DIAGNOSIS — J810 Acute pulmonary edema: Secondary | ICD-10-CM

## 2019-06-23 LAB — CBC
HCT: 36 % — ABNORMAL LOW (ref 39.0–52.0)
Hemoglobin: 11.6 g/dL — ABNORMAL LOW (ref 13.0–17.0)
MCH: 31.8 pg (ref 26.0–34.0)
MCHC: 32.2 g/dL (ref 30.0–36.0)
MCV: 98.6 fL (ref 80.0–100.0)
Platelets: 93 10*3/uL — ABNORMAL LOW (ref 150–400)
RBC: 3.65 MIL/uL — ABNORMAL LOW (ref 4.22–5.81)
RDW: 14.6 % (ref 11.5–15.5)
WBC: 8.9 10*3/uL (ref 4.0–10.5)
nRBC: 0 % (ref 0.0–0.2)

## 2019-06-23 LAB — BASIC METABOLIC PANEL
Anion gap: 10 (ref 5–15)
BUN: 23 mg/dL (ref 8–23)
CO2: 20 mmol/L — ABNORMAL LOW (ref 22–32)
Calcium: 8.2 mg/dL — ABNORMAL LOW (ref 8.9–10.3)
Chloride: 112 mmol/L — ABNORMAL HIGH (ref 98–111)
Creatinine, Ser: 1.1 mg/dL (ref 0.61–1.24)
GFR calc Af Amer: >60 mL/min (ref >60)
GFR calc non Af Amer: 56 mL/min — ABNORMAL LOW (ref >60)
Glucose, Bld: 95 mg/dL (ref 70–99)
Potassium: 3.9 mmol/L (ref 3.5–5.1)
Sodium: 142 mmol/L (ref 135–145)

## 2019-06-23 LAB — URINE CULTURE: Culture: >=100000 — AB

## 2019-06-23 MED ORDER — FUROSEMIDE 10 MG/ML IJ SOLN
20.0000 mg | Freq: Once | INTRAMUSCULAR | Status: AC
  Administered 2019-06-23: 20 mg via INTRAVENOUS
  Filled 2019-06-23: qty 2

## 2019-06-23 MED ORDER — ALBUTEROL SULFATE (2.5 MG/3ML) 0.083% IN NEBU
2.5000 mg | INHALATION_SOLUTION | Freq: Four times a day (QID) | RESPIRATORY_TRACT | Status: DC | PRN
  Administered 2019-06-24: 2.5 mg via RESPIRATORY_TRACT
  Filled 2019-06-23: qty 3

## 2019-06-23 MED ORDER — VANCOMYCIN HCL 1250 MG/250ML IV SOLN
1250.0000 mg | INTRAVENOUS | Status: DC
  Administered 2019-06-24: 1250 mg via INTRAVENOUS
  Filled 2019-06-23 (×2): qty 250

## 2019-06-23 MED ORDER — VANCOMYCIN HCL 2000 MG/400ML IV SOLN
2000.0000 mg | Freq: Once | INTRAVENOUS | Status: AC
  Administered 2019-06-23: 2000 mg via INTRAVENOUS
  Filled 2019-06-23: qty 400

## 2019-06-23 MED ORDER — FUROSEMIDE 10 MG/ML IJ SOLN
40.0000 mg | Freq: Once | INTRAMUSCULAR | Status: AC
  Administered 2019-06-23: 40 mg via INTRAVENOUS
  Filled 2019-06-23: qty 4

## 2019-06-23 NOTE — Progress Notes (Signed)
PT Cancellation Note  Patient Details Name: Robert [name] MRN: [id number] DOB: 1921-02-06   Cancelled Treatment:    Reason Eval/Treat Not Completed: Medical issues which prohibited therapy. Attempted PT eval; however, pt's O2 sat (supine) at start is 84% on 3 L O2 via [place]. After discussing with RN and given permission, increased O2 to 4L and then 5L, but O2 continues to range from 81-84%. RN called to further assess; will re-attempt PT eval when patient is more stable.   Madilyn Hook 06/23/2019, 2:30 PM

## 2019-06-23 NOTE — Plan of Care (Addendum)
VSS. No tele per order. Denies any pain or SOB. Falls precautions maintained. Assessment as documented. Pt breathing w/o difficulty on 4L O2 via nasal cannula. IV antibiotics continued per order. Medications crushed and given with applesauce. Pt tolerates well. Foley in place despite removal order per verbal report from day shift RN. Pt had respiratory distress and needed lasix, per dr at time, keep foley in place for accurate I/os. Urine output clear yellow and amt adequate. Pt has hearing aids but wife takes home at night. Pt seems to communicate understanding. POC reviewed. No new needs/concerns at this time.   Problem: Education: Goal: Knowledge of General Education information will improve Description: Including pain rating scale, medication(s)/side effects and non-pharmacologic comfort measures Outcome: Progressing   Problem: Health Behavior/Discharge Planning: Goal: Ability to manage health-related needs will improve Outcome: Progressing   Problem: Clinical Measurements: Goal: Ability to maintain clinical measurements within normal limits will improve Outcome: Progressing Goal: Will remain free from infection Outcome: Progressing Goal: Diagnostic test results will improve Outcome: Progressing Goal: Respiratory complications will improve Outcome: Progressing Goal: Cardiovascular complication will be avoided Outcome: Progressing   Problem: Activity: Goal: Risk for activity intolerance will decrease Outcome: Progressing   Problem: Nutrition: Goal: Adequate nutrition will be maintained Outcome: Progressing   Problem: Coping: Goal: Level of anxiety will decrease Outcome: Progressing   Problem: Elimination: Goal: Will not experience complications related to bowel motility Outcome: Progressing Goal: Will not experience complications related to urinary retention Outcome: Progressing   Problem: Pain Managment: Goal: General experience of comfort will improve Outcome:  Progressing   Problem: Safety: Goal: Ability to remain free from injury will improve Outcome: Progressing   Problem: Skin Integrity: Goal: Risk for impaired skin integrity will decrease Outcome: Progressing

## 2019-06-23 NOTE — Progress Notes (Addendum)
PROGRESS NOTE                                                                                                                                                                                                             Patient Demographics:    Robert [name], is a 86 y.o. male, DOB - August 03, 1920, BWL:[id number]  Admit date - 06/21/2019   Admitting Physician Collier Bullock, MD  Outpatient Primary MD for the patient is Dion Body, MD  LOS - 2    Chief Complaint  Patient presents with  . Altered Mental Status       Brief Narrative 86 year old male with history of CVA, TIAs, hypertension and nephrolithiasis presented with acute change in his mental status of 1 day duration.  Patient having poor p.o. intake and nausea with low back pain, subsequently developed chills but no fever. In the ED he has significant leukocytosis with WBC of 20 K, UA Sater UTI, elevated lactic acid of 2.9.  Patient met criteria for sepsis.  CT scan with renal study showed 4 mm calculus at this into the left UPJ with mild proximal left hydroureteronephrosis. Patient given IV fluids.  Sepsis pathway initiated.  Empiric antibiotic started and urology consulted. Patient taken to the OR for cystoscopy with retrograde pyelogram and stent placement.   Subjective:   No overnight events.  Appears congested and coughing.  Assessment  & Plan :    Principal Problem: Severe sepsis ([place]) Secondary to UTI with bacteremia. Blood culture 3/4 growing GNR, E. Coli/E faecalis.  Sensitivity pending.  On IV meropenem.   Active problems   Hydronephrosis with urinary obstruction due to ureteral calculus S/p retrograde pyelogram and left ureteral stent placement.  Discontinue Foley.  Urology following.  Acute kidney injury Likely combination of ATN with sepsis and postobstructive.  Received IV hydration and resolved.  Acute pulmonary edema Possibly due to IV  hydration.  Discontinued IV fluids and ordered 20 mg IV Lasix.  Monitor O2 sat.    Acute metabolic encephalopathy Secondary to sepsis.  Resolved.  Essential hypertension Resume amlodipine.  History of CVA Continue aspirin and statin.  Thrombocytopenia Mild, possibly due to sepsis.  Monitor for now.   Code Status : Full code.  Discussed in detail with patient and his wife and patient wants to have full scope of treatment.  Family Communication  :  Wife at bedside  Disposition Plan  : Home possibly in 24-48 hours based on final culture and sensitivity and antibiotic course.  Barriers For Discharge : Bacteremia  Consults  : Urology  Procedures  : CT renal study, left ureteral stent  DVT Prophylaxis  :  Lovenox -   Lab Results  Component Value Date   PLT 93 (L) 06/23/2019    Antibiotics  :    Anti-infectives (From admission, onward)   Start     Dose/Rate Route Frequency Ordered Stop   06/22/19 1000  ceFEPIme (MAXIPIME) 2 g in sodium chloride 0.9 % 100 mL IVPB  Status:  Discontinued     2 g 200 mL/hr over 30 Minutes Intravenous Every 24 hours 06/21/19 1036 06/21/19 2213   06/21/19 2215  meropenem (MERREM) 1 g in sodium chloride 0.9 % 100 mL IVPB     1 g 200 mL/hr over 30 Minutes Intravenous Every 12 hours 06/21/19 2213     06/21/19 1100  vancomycin (VANCOREADY) IVPB 750 mg/150 mL     750 mg 150 mL/hr over 60 Minutes Intravenous  Once 06/21/19 1028 06/21/19 1208   06/21/19 1015  ceFEPIme (MAXIPIME) 2 g in sodium chloride 0.9 % 100 mL IVPB     2 g 200 mL/hr over 30 Minutes Intravenous  Once 06/21/19 1013 06/21/19 1115   06/21/19 0830  cefTRIAXone (ROCEPHIN) 1 g in sodium chloride 0.9 % 100 mL IVPB     1 g 200 mL/hr over 30 Minutes Intravenous  Once 06/21/19 0820 06/21/19 0924   06/21/19 0830  vancomycin (VANCOCIN) IVPB 1000 mg/200 mL premix     1,000 mg 200 mL/hr over 60 Minutes Intravenous STAT 06/21/19 0829 06/21/19 1022        Objective:   Vitals:   06/22/19  1620 06/22/19 1931 06/23/19 0524 06/23/19 0809  BP: 136/64 (!) 145/72 (!) 156/73 (!) 180/98  Pulse: 65 67 86 92  Resp:  16 20 18   Temp: 98.2 F (36.8 C) 98.2 F (36.8 C) 98.3 F (36.8 C) 98.6 F (37 C)  TempSrc: Oral Oral Oral   SpO2: 92% 94% 90% (!) 86%  Weight:   89.1 kg   Height:        Wt Readings from Last 3 Encounters:  06/23/19 89.1 kg  01/28/19 89.8 kg  04/03/18 92.1 kg     Intake/Output Summary (Last 24 hours) at 06/23/2019 1202 Last data filed at 06/23/2019 1038 Gross per 24 hour  Intake 1418.67 ml  Output 2625 ml  Net -1206.33 ml    Physical exam Elderly male, fatigued HEENT: Moist mucosa, supple neck Chest: Fine crackles over lung base CVs: Normal S1-S2 GI: Soft, nondistended, nontender, Foley + Musculoskeletal: Warm, no edema     Data Review:    CBC Recent Labs  Lab 06/21/19 0705 06/22/19 0529 06/23/19 0508  WBC 21.3* 8.8 8.9  HGB 12.6* 12.0* 11.6*  HCT 38.1* 36.0* 36.0*  PLT 135* 102* 93*  MCV 96.5 98.6 98.6  MCH 31.9 32.9 31.8  MCHC 33.1 33.3 32.2  RDW 14.5 14.7 14.6  LYMPHSABS 0.3*  --   --   MONOABS 0.7  --   --   EOSABS 0.1  --   --   BASOSABS 0.1  --   --     Chemistries  Recent Labs  Lab 06/21/19 0705 06/22/19 0529 06/23/19 0508  NA 142 143 142  K 4.2 4.1 3.9  CL 112* 114* 112*  CO2 20* 22 20*  GLUCOSE 101* 94 95  BUN 28* 27* 23  CREATININE 1.54* 1.37* 1.10  CALCIUM 9.0 7.8* 8.2*  AST 38  --   --   ALT 16  --   --   ALKPHOS 57  --   --   BILITOT 1.3*  --   --    ------------------------------------------------------------------------------------------------------------------ No results for input(s): CHOL, HDL, LDLCALC, TRIG, CHOLHDL, LDLDIRECT in the last 72 hours.  No results found for: HGBA1C ------------------------------------------------------------------------------------------------------------------ No results for input(s): TSH, T4TOTAL, T3FREE, THYROIDAB in the last 72 hours.  Invalid input(s):  FREET3 ------------------------------------------------------------------------------------------------------------------ No results for input(s): VITAMINB12, FOLATE, FERRITIN, TIBC, IRON, RETICCTPCT in the last 72 hours.  Coagulation profile Recent Labs  Lab 06/22/19 0529  INR 1.2    No results for input(s): DDIMER in the last 72 hours.  Cardiac Enzymes No results for input(s): CKMB, TROPONINI, MYOGLOBIN in the last 168 hours.  Invalid input(s): CK ------------------------------------------------------------------------------------------------------------------ No results found for: BNP  Inpatient Medications  Scheduled Meds: . amLODipine  5 mg Oral Daily  . aspirin EC  81 mg Oral Daily  . atorvastatin  10 mg Oral QHS  . Chlorhexidine Gluconate Cloth  6 each Topical Daily  . enoxaparin (LOVENOX) injection  40 mg Subcutaneous Q24H  . multivitamin with minerals  1 tablet Oral Daily  . omega-3 acid ethyl esters  2 g Oral Daily  . senna  1 tablet Oral BID  . sodium chloride flush  3 mL Intravenous Q12H  . tamsulosin  0.4 mg Oral Daily   Continuous Infusions: . meropenem (MERREM) IV 1 g (06/23/19 0908)   PRN Meds:.acetaminophen **OR** acetaminophen, ondansetron **OR** ondansetron (ZOFRAN) IV  Micro Results Recent Results (from the past 240 hour(s))  Blood Culture (routine x 2)     Status: Abnormal (Preliminary result)   Collection Time: 06/21/19  7:05 AM   Specimen: BLOOD  Result Value Ref Range Status   Specimen Description   Final    BLOOD L FARM Performed at [facility], [address], [place], [place] [address]    Special Requests   Final    BOTTLES DRAWN AEROBIC AND ANAEROBIC Blood Culture adequate volume Performed at [facility], [address] [place], [name] [address]    Culture  Setup Time   Final    Organism ID to follow IN BOTH AEROBIC AND ANAEROBIC BOTTLES GRAM NEGATIVE RODS CRITICAL RESULT CALLED TO, READ BACK BY AND VERIFIED  WITH: [place] ON 06/21/19 AT 2006 [facility] Performed at [facility], [address], [place], [place] [address]    Culture (A)  Final    ESCHERICHIA COLI SUSCEPTIBILITIES TO FOLLOW Performed at [facility], [place] [address] [place], [place] [address]    Report Status PENDING  Incomplete  Blood Culture (routine x 2)     Status: Abnormal (Preliminary result)   Collection Time: 06/21/19  7:05 AM   Specimen: BLOOD  Result Value Ref Range Status   Specimen Description   Final    BLOOD R AC Performed at [facility], [address] [place], [place] [address]    Special Requests   Final    BOTTLES DRAWN AEROBIC AND ANAEROBIC Blood Culture adequate volume Performed at [facility], [address] [place], [place] [address]    Culture  Setup Time   Final    IN BOTH AEROBIC AND ANAEROBIC BOTTLES GRAM NEGATIVE RODS CRITICAL VALUE NOTED.  VALUE IS CONSISTENT WITH PREVIOUSLY REPORTED AND CALLED VALUE. Performed  at [facility], [address] [place], [place] [address]    Culture ESCHERICHIA COLI (A)  Final   Report Status PENDING  Incomplete  Urine culture     Status: Abnormal   Collection Time: 06/21/19  7:05 AM   Specimen: In/Out Cath Urine  Result Value Ref Range Status   Specimen Description   Final    IN/OUT CATH URINE Performed at [facility], [address] [place], [place] [address]    Special Requests   Final    NONE Performed at [facility], [address] [place], [place] [address]    Culture >=100,000 COLONIES/mL ESCHERICHIA COLI (A)  Final   Report Status 06/23/2019 FINAL  Final   Organism ID, Bacteria ESCHERICHIA COLI (A)  Final      Susceptibility   Escherichia coli - MIC*    AMPICILLIN >=32 RESISTANT Resistant     CEFAZOLIN <=4 SENSITIVE Sensitive     CEFTRIAXONE <=0.25 SENSITIVE Sensitive     CIPROFLOXACIN >=4 RESISTANT Resistant     GENTAMICIN <=1 SENSITIVE Sensitive     IMIPENEM <=0.25  SENSITIVE Sensitive     NITROFURANTOIN <=16 SENSITIVE Sensitive     TRIMETH/SULFA >=320 RESISTANT Resistant     AMPICILLIN/SULBACTAM 16 INTERMEDIATE Intermediate     PIP/TAZO <=4 SENSITIVE Sensitive     * >=100,000 COLONIES/mL ESCHERICHIA COLI  Blood Culture ID Panel (Reflexed)     Status: Abnormal   Collection Time: 06/21/19  7:05 AM  Result Value Ref Range Status   Enterococcus species NOT DETECTED NOT DETECTED Final   Listeria monocytogenes NOT DETECTED NOT DETECTED Final   Staphylococcus species NOT DETECTED NOT DETECTED Final   Staphylococcus aureus (BCID) NOT DETECTED NOT DETECTED Final   Streptococcus species NOT DETECTED NOT DETECTED Final   Streptococcus agalactiae NOT DETECTED NOT DETECTED Final   Streptococcus pneumoniae NOT DETECTED NOT DETECTED Final   Streptococcus pyogenes NOT DETECTED NOT DETECTED Final   Acinetobacter baumannii NOT DETECTED NOT DETECTED Final   Enterobacteriaceae species DETECTED (A) NOT DETECTED Final    Comment: Enterobacteriaceae represent a large family of gram-negative bacteria, not a single organism. CRITICAL RESULT CALLED TO, READ BACK BY AND VERIFIED WITH: LISA KLUTTZ ON 06/21/19 AT 2006 [facility]    Enterobacter cloacae complex NOT DETECTED NOT DETECTED Final   Escherichia coli DETECTED (A) NOT DETECTED Final    Comment: CRITICAL RESULT CALLED TO, READ BACK BY AND VERIFIED WITH: LISA KLUTTZ ON 06/21/19 AT 2006 [facility]    Klebsiella oxytoca NOT DETECTED NOT DETECTED Final   Klebsiella pneumoniae NOT DETECTED NOT DETECTED Final   Proteus species NOT DETECTED NOT DETECTED Final   Serratia marcescens NOT DETECTED NOT DETECTED Final   Carbapenem resistance NOT DETECTED NOT DETECTED Final   Haemophilus influenzae NOT DETECTED NOT DETECTED Final   Neisseria meningitidis NOT DETECTED NOT DETECTED Final   Pseudomonas aeruginosa NOT DETECTED NOT DETECTED Final   Candida albicans NOT DETECTED NOT DETECTED Final   Candida glabrata NOT DETECTED NOT DETECTED Final    Candida krusei NOT DETECTED NOT DETECTED Final   Candida parapsilosis NOT DETECTED NOT DETECTED Final   Candida tropicalis NOT DETECTED NOT DETECTED Final    Comment: Performed at [facility], [address] [place], [place] [address]  Respiratory Panel by RT PCR (Flu A&B, Covid) - Nasopharyngeal Swab     Status: None   Collection Time: 06/21/19  9:44 AM   Specimen: Nasopharyngeal Swab  Result Value Ref Range Status  SARS Coronavirus 2 by RT PCR NEGATIVE NEGATIVE Final    Comment: (NOTE) SARS-CoV-2 target nucleic acids are NOT DETECTED. The SARS-CoV-2 RNA is generally detectable in upper respiratoy specimens during the acute phase of infection. The lowest concentration of SARS-CoV-2 viral copies this assay can detect is 131 copies/mL. A negative result does not preclude SARS-Cov-2 infection and should not be used as the sole basis for treatment or other patient management decisions. A negative result may occur with  improper specimen collection/handling, submission of specimen other than nasopharyngeal swab, presence of viral mutation(s) within the areas targeted by this assay, and inadequate number of viral copies (<131 copies/mL). A negative result must be combined with clinical observations, patient history, and epidemiological information. The expected result is Negative. Fact Sheet for Patients:  PinkCheek.be Fact Sheet for Healthcare Providers:  GravelBags.it This test is not yet ap proved or cleared by the [place] FDA and  has been authorized for detection and/or diagnosis of SARS-CoV-2 by FDA under an Emergency Use Authorization (EUA). This EUA will remain  in effect (meaning this test can be used) for the duration of the COVID-19 declaration under Section 564(b)(1) of the Act, 21 U.S.C. section 360bbb-3(b)(1), unless the authorization is terminated or revoked sooner.    Influenza A by PCR  NEGATIVE NEGATIVE Final   Influenza B by PCR NEGATIVE NEGATIVE Final    Comment: (NOTE) The Xpert Xpress SARS-CoV-2/FLU/RSV assay is intended as an aid in  the diagnosis of influenza from Nasopharyngeal swab specimens and  should not be used as a sole basis for treatment. Nasal washings and  aspirates are unacceptable for Xpert Xpress SARS-CoV-2/FLU/RSV  testing. Fact Sheet for Patients: PinkCheek.be Fact Sheet for Healthcare Providers: GravelBags.it This test is not yet approved or cleared by the [place] FDA and  has been authorized for detection and/or diagnosis of SARS-CoV-2 by  FDA under an Emergency Use Authorization (EUA). This EUA will remain  in effect (meaning this test can be used) for the duration of the  Covid-19 declaration under Section 564(b)(1) of the Act, 21  U.S.C. section 360bbb-3(b)(1), unless the authorization is  terminated or revoked. Performed at [facility], [address] [place], [place] [address]     Radiology Reports DG Abd 1 View  Result Date: 06/22/2019 CLINICAL DATA:  Obstructing nephrolithiasis. EXAM: ABDOMEN - 1 VIEW COMPARISON:  Abdomen and pelvis CT obtained earlier today. FINDINGS: Normal bowel gas pattern. Interval left ureteral stent in satisfactory position. No radiographically visible calculi. Bilateral hip prostheses. Thoracic and lumbar spine degenerative changes and mild scoliosis. IMPRESSION: No acute abnormality. Interval left ureteral stent in satisfactory position. Electronically Signed   By: Claudie Revering M.D.   On: 06/22/2019 13:49   DG Chest Port 1 View  Result Date: 06/21/2019 CLINICAL DATA:  Altered mental status EXAM: PORTABLE CHEST 1 VIEW COMPARISON:  02/19/2018 chest radiograph. FINDINGS: Stable cardiomediastinal silhouette with normal heart size. No pneumothorax. No significant pleural effusions. No pulmonary edema. Mild bibasilar scarring versus atelectasis,  unchanged. No acute consolidative airspace disease. IMPRESSION: Stable mild bibasilar scarring versus atelectasis. Electronically Signed   By: Ilona Sorrel M.D.   On: 06/21/2019 08:00   DG OR UROLOGY CYSTO IMAGE ([facility] ONLY)  Result Date: 06/21/2019 There is no interpretation for this exam.  This order is for images obtained during a surgical procedure.  Please See "Surgeries" Tab for more information regarding the procedure.   CT Renal Stone Study  Result Date: 06/21/2019 CLINICAL DATA:  86 year old male  with history of left-sided flank pain. Suspected kidney stones. EXAM: CT ABDOMEN AND PELVIS WITHOUT CONTRAST TECHNIQUE: Multidetector CT imaging of the abdomen and pelvis was performed following the standard protocol without IV contrast. COMPARISON:  CT the abdomen and pelvis 02/19/2018. FINDINGS: Lower chest: Mild chronic scarring in the left lower lobe, similar to the prior study. Aortic atherosclerosis. Calcifications of the aortic valve and mitral annulus. Hepatobiliary: No definite cystic or solid hepatic lesions are confidently identified on today's noncontrast CT examination. Calcified gallstones are noted in the gallbladder measuring up to 2.2 cm in diameter. No findings to suggest an acute cholecystitis at this time. Pancreas: No definite pancreatic mass or peripancreatic fluid collections or inflammatory changes are noted on today's noncontrast CT examination. Spleen: Unremarkable. Adrenals/Urinary Tract: Nonobstructive calculi are noted within the collecting systems of both kidneys, largest of which is in the lower pole of the right kidney measuring 1.1 cm in diameter. In addition, there is a 4 mm calculus adjacent to the left ureterovesicular junction in the distal third of the left ureter (axial image 77 of series 2). This is associated with minimal proximal left hydroureteronephrosis indicative of very mild obstruction. Unenhanced appearance of the kidneys is otherwise unremarkable. Urinary  bladder is normal in appearance. Bilateral adrenal glands are normal in appearance. Stomach/Bowel: Unenhanced appearance of the stomach is normal. No pathologic dilatation of small bowel or colon. Normal appendix. Vascular/Lymphatic: Aortic atherosclerosis. No lymphadenopathy noted in the abdomen or pelvis. Reproductive: Prostate gland and seminal vesicles are poorly visualized secondary to beam hardening artifact from the patient's bilateral hip arthroplasties. Other: No significant volume of ascites.  No pneumoperitoneum. Musculoskeletal: Status post bilateral hip arthroplasty. There are no aggressive appearing lytic or blastic lesions noted in the visualized portions of the skeleton. IMPRESSION: 1. 4 mm calculus adjacent to the left ureterovesicular junction with very mild proximal left hydroureteronephrosis indicative of very mild obstruction. 2. Other nonobstructive calculi in the collecting systems of the kidneys bilaterally measuring up to 1.1 cm in diameter in the lower pole collecting system of the right kidney. 3. Cholelithiasis without evidence of acute cholecystitis at this time. 4. Aortic atherosclerosis. 5. There are calcifications of the aortic valve and mitral annulus. Echocardiographic correlation for evaluation of potential valvular dysfunction may be warranted if clinically indicated. Electronically Signed   By: Vinnie Langton M.D.   On: 06/21/2019 09:11    Time Spent in minutes 35   Yolonda Purtle M.D on 06/23/2019 at 12:02 PM  Between 7am to 7pm - Pager - [phone]  After 7pm go to [email or web address] - password [facility]  [facility] -  Office  [phone]

## 2019-06-23 NOTE — Progress Notes (Signed)
Urology Consult Follow Up  Subjective: No complaints this morning.  Urine culture positive E. coli sensitive to cephalosporins and imipenem.  Blood culture growing E. coli with sensitivities pending  KUB-stent in good position.  Stone not visualized  Anti-infectives: Anti-infectives (From admission, onward)   Start     Dose/Rate Route Frequency Ordered Stop   06/22/19 1000  ceFEPIme (MAXIPIME) 2 g in sodium chloride 0.9 % 100 mL IVPB  Status:  Discontinued     2 g 200 mL/hr over 30 Minutes Intravenous Every 24 hours 06/21/19 1036 06/21/19 2213   06/21/19 2215  meropenem (MERREM) 1 g in sodium chloride 0.9 % 100 mL IVPB     1 g 200 mL/hr over 30 Minutes Intravenous Every 12 hours 06/21/19 2213     06/21/19 1100  vancomycin (VANCOREADY) IVPB 750 mg/150 mL     750 mg 150 mL/hr over 60 Minutes Intravenous  Once 06/21/19 1028 06/21/19 1208   06/21/19 1015  ceFEPIme (MAXIPIME) 2 g in sodium chloride 0.9 % 100 mL IVPB     2 g 200 mL/hr over 30 Minutes Intravenous  Once 06/21/19 1013 06/21/19 1115   06/21/19 0830  cefTRIAXone (ROCEPHIN) 1 g in sodium chloride 0.9 % 100 mL IVPB     1 g 200 mL/hr over 30 Minutes Intravenous  Once 06/21/19 0820 06/21/19 0924   06/21/19 0830  vancomycin (VANCOCIN) IVPB 1000 mg/200 mL premix     1,000 mg 200 mL/hr over 60 Minutes Intravenous STAT 06/21/19 0829 06/21/19 1022      Current Facility-Administered Medications  Medication Dose Route Frequency Provider Last Rate Last Admin  . acetaminophen (TYLENOL) tablet 650 mg  650 mg Oral Q6H PRN Agbata, Tochukwu, MD       Or  . acetaminophen (TYLENOL) suppository 650 mg  650 mg Rectal Q6H PRN Agbata, Tochukwu, MD      . amLODipine (NORVASC) tablet 5 mg  5 mg Oral Daily Dhungel, Nishant, MD   5 mg at 06/23/19 0910  . aspirin EC tablet 81 mg  81 mg Oral Daily Agbata, Tochukwu, MD   81 mg at 06/23/19 0910  . atorvastatin (LIPITOR) tablet 10 mg  10 mg Oral QHS Agbata, Tochukwu, MD   10 mg at 06/22/19 2126  .  Chlorhexidine Gluconate Cloth 2 % PADS 6 each  6 each Topical Daily Agbata, Tochukwu, MD   6 each at 06/23/19 0910  . enoxaparin (LOVENOX) injection 40 mg  40 mg Subcutaneous Q24H Dhungel, Nishant, MD   40 mg at 06/22/19 2127  . meropenem (MERREM) 1 g in sodium chloride 0.9 % 100 mL IVPB  1 g Intravenous Q12H Lang Snow, NP 200 mL/hr at 06/23/19 0908 1 g at 06/23/19 0908  . multivitamin with minerals tablet 1 tablet  1 tablet Oral Daily Agbata, Tochukwu, MD   1 tablet at 06/23/19 0910  . omega-3 acid ethyl esters (LOVAZA) capsule 2 g  2 g Oral Daily Agbata, Tochukwu, MD   2 g at 06/23/19 0914  . ondansetron (ZOFRAN) tablet 4 mg  4 mg Oral Q6H PRN Agbata, Tochukwu, MD       Or  . ondansetron (ZOFRAN) injection 4 mg  4 mg Intravenous Q6H PRN Agbata, Tochukwu, MD   4 mg at 06/21/19 1232  . senna (SENOKOT) tablet 8.6 mg  1 tablet Oral BID Agbata, Tochukwu, MD   8.6 mg at 06/23/19 0910  . sodium chloride flush (NS) 0.9 % injection 3 mL  3 mL Intravenous  Q12H Collier Bullock, MD   3 mL at 06/23/19 0911  . tamsulosin (FLOMAX) capsule 0.4 mg  0.4 mg Oral Daily Agbata, Tochukwu, MD   0.4 mg at 06/23/19 0910     Objective: Vital signs in last 24 hours: Temp:  [98.2 F (36.8 C)-98.6 F (37 C)] 98.6 F (37 C) (03/07 0809) Pulse Rate:  [65-92] 92 (03/07 0809) Resp:  [16-20] 18 (03/07 0809) BP: (136-180)/(64-98) 180/98 (03/07 0809) SpO2:  [86 %-94 %] 86 % (03/07 0809) Weight:  [89.1 kg] 89.1 kg (03/07 0524)  Intake/Output from previous day: 03/06 0701 - 03/07 0700 In: 1014.2 [I.V.:814.2; IV Piggyback:200] Out: 1150 [Urine:1150] Intake/Output this shift: Total I/O In: 404.5 [P.O.:118; I.V.:286.5] Out: 1475 [Urine:1475]   Physical Exam: Alert Abdomen soft Urine clear  Lab Results:  Recent Labs    06/22/19 0529 06/23/19 0508  WBC 8.8 8.9  HGB 12.0* 11.6*  HCT 36.0* 36.0*  PLT 102* 93*   BMET Recent Labs    06/22/19 0529 06/23/19 0508  NA 143 142  K 4.1 3.9  CL  114* 112*  CO2 22 20*  GLUCOSE 94 95  BUN 27* 23  CREATININE 1.37* 1.10  CALCIUM 7.8* 8.2*   PT/INR Recent Labs    06/22/19 0529  LABPROT 15.3*  INR 1.2     Assessment: Clinically improved status post stent placement  s/p Procedure(s): CYSTOSCOPY WITH STENT PLACEMENT CYSTOSCOPY WITH RETROGRADE PYELOGRAM  Plan: -Okay to DC Foley   LOS: 2 days    Robert [name] 06/23/2019

## 2019-06-23 NOTE — Progress Notes (Signed)
Pharmacy Antibiotic Note  Robert [name] is a 86 y.o. male admitted on 06/21/2019 with pneumonia.  Pharmacy has been consulted for Vancomycin dosing.  Plan: Vancomycin 2 gm IV X 1 ordered to be given on 3/7 @ 1700. Vancomycin 1250 mg IV Q24H ordered to be given on 3/8 @ 1700.  No peak or trough currently.   Vd = 64.2 L Ke = 0.039 hr-1 T1/2 = 17.7 hrs AUC = 498.1 Vanc trough = 13.3 mcg/mL   Height: 5\' 11"  (180.3 cm) Weight: 196 lb 6.9 oz (89.1 kg) IBW/kg (Calculated) : 75.3  Temp (24hrs), Avg:98.3 F (36.8 C), Min:98.2 F (36.8 C), Max:98.6 F (37 C)  Recent Labs  Lab 06/21/19 0705 06/21/19 1008 06/22/19 0529 06/23/19 0508  WBC 21.3*  --  8.8 8.9  CREATININE 1.54*  --  1.37* 1.10  LATICACIDVEN 2.9* 2.4*  --   --     Estimated Creatinine Clearance: 41.8 mL/min (by C-G formula based on SCr of 1.1 mg/dL).    Allergies  Allergen Reactions  . Morphine And Related Nausea And Vomiting  . Tramadol Nausea And Vomiting  . Sulfamethoxazole-Trimethoprim Rash    Antimicrobials this admission:   >>    >>   Dose adjustments this admission:   Microbiology results:  BCx:   UCx:    Sputum:    MRSA PCR:   Thank you for allowing pharmacy to be a part of this patient's care.  Robert [name] D 06/23/2019 5:22 PM

## 2019-06-24 DIAGNOSIS — J9601 Acute respiratory failure with hypoxia: Secondary | ICD-10-CM

## 2019-06-24 DIAGNOSIS — Z7189 Other specified counseling: Secondary | ICD-10-CM

## 2019-06-24 DIAGNOSIS — Z515 Encounter for palliative care: Secondary | ICD-10-CM

## 2019-06-24 DIAGNOSIS — J690 Pneumonitis due to inhalation of food and vomit: Secondary | ICD-10-CM

## 2019-06-24 LAB — CULTURE, BLOOD (ROUTINE X 2)
Special Requests: ADEQUATE
Special Requests: ADEQUATE

## 2019-06-24 LAB — MRSA PCR SCREENING: MRSA by PCR: NEGATIVE

## 2019-06-24 MED ORDER — FUROSEMIDE 10 MG/ML IJ SOLN
40.0000 mg | Freq: Once | INTRAMUSCULAR | Status: AC
  Administered 2019-06-24: 40 mg via INTRAVENOUS
  Filled 2019-06-24: qty 4

## 2019-06-24 MED ORDER — METRONIDAZOLE IN NACL 5-0.79 MG/ML-% IV SOLN
500.0000 mg | Freq: Three times a day (TID) | INTRAVENOUS | Status: DC
  Administered 2019-06-24 – 2019-06-28 (×11): 500 mg via INTRAVENOUS
  Filled 2019-06-24 (×15): qty 100

## 2019-06-24 MED ORDER — SODIUM CHLORIDE 0.9 % IV SOLN
2.0000 g | INTRAVENOUS | Status: DC
  Administered 2019-06-24 – 2019-06-28 (×4): 2 g via INTRAVENOUS
  Filled 2019-06-24: qty 20
  Filled 2019-06-24: qty 2
  Filled 2019-06-24 (×2): qty 20
  Filled 2019-06-24: qty 2

## 2019-06-24 NOTE — Progress Notes (Addendum)
Speech Language Pathology Treatment: Dysphagia  Patient Details Name: Robert [name] MRN: [id number] DOB: 03-20-1921 Today's Date: 06/24/2019 Time: 1300-1350 SLP Time Calculation (min) (ACUTE ONLY): 50 min  Assessment / Plan / Recommendation Clinical Impression  Pt was seen for ongoing assessment of swallowing; toleration of currently recommended dysphagia diet (post eval on 06/22/2018) where overt s/s of aspiration were noted w/ thin liquids. Per a change in status yesterday, pt's diet was discontinued per chart orders. Acute Pulmonary edema was noted secondary to volume overload with IV hydration; no fluids running currently and intermittent IV Lasix given. Pt remains on [place] O2 support. No Cough noted at rest and upon moving in bed - nor w/ most po trials given except x1(larger sip). Noted WBC, afebrile. Noted Palliative Care following for discussion on [place]; Code status. Pt has had Palliative Care services at Home per chart notes.  Pt alert, awake, and Min+ HOH w/out his HAs which are charging today per Wife. Pt stated he was "Not Hungry" when asked several times by SLP. This has been Ongoing at Home per pt/Wife and chart notes. Noted pt's advanced Age. Education given to Wife and pt on aspiration precautions; oropharyngeal phase dysphagia; impact from illness and advanced age on swallowing; impact of dysphagia on the airway/lungs and oral intake in general. Discussed f/u w/ MBSS next 1-2 days for objective assessment of swallowing.  Recommend reinitiate the Dysphagia level 3 diet w/ moistened foods; NECTAR consistency liquids VIA CUP - NO STRAWS. Aspiration precautions. Pills in Puree for safer swallowing. Supervision at meals for follow through w/ aspiration precautions including Small sips, Slowly AND Sitting UPRIGHT w/ Head Forward w/ all oral intake.     HPI HPI: Pt is a 86 y.o. 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  Patient was brought in this morning for evaluation of mental status changes which the wife noted 1 day prior to his admission.  She states that patient complained of not feeling well and had nausea but no vomiting.  He has a history of low back pain but the wife states that the pain got worse the day prior to his admission and they thought it was exacerbated from physical therapy that he had earlier in the day.  At baseline patient is normally awake, alert and oriented x 2 but on admission he is only oriented to person.  Labs revealed marked leukocytosis, 20K with a left shift, he has pyuria and lactate is elevated at 2.9.  CT scan renal study showed 4 mm calculus adjacent to the left ureterovesicular junction with very mild proximal left hydroureteronephrosis indicative of mild obstruction.  Chest x-ray does not show any acute findings.  Pt admitted w/ dx of metabolic encephaloapathy; CT showed mild left-sided hydronephrosis from an obstructing stone.  Unsure of pt's baseline Cognitive status - noted decreased orientation per Wife's report.       SLP Plan  Continue with current plan of care(MBSS TBD)       Recommendations  Diet recommendations: Dysphagia 3 (mechanical soft);Nectar-thick liquid Liquids provided via: Cup;No straw Medication Administration: Whole meds with puree(Crushed if needed -- for safer swallowing ) Supervision: Patient able to self feed;Staff to assist with self feeding;Intermittent supervision to cue for compensatory strategies Compensations: Minimize environmental distractions;Slow rate;Small sips/bites;Lingual sweep for clearance of pocketing;Multiple dry swallows after each bite/sip;Follow solids with liquid Postural Changes and/or Swallow Maneuvers: Seated upright 90 degrees;Upright 30-60 min after meal;Out of bed  for meals                General recommendations: (Dietician f/u; Palliative Care f/u for [place] - ongoing) Oral Care Recommendations: Oral  care BID;Oral care before and after PO;Staff/trained caregiver to provide oral care Follow up Recommendations: (TBD) SLP Visit Diagnosis: Dysphagia, oropharyngeal phase (R13.12);Dysphagia, pharyngeal phase (R13.13) Plan: Continue with current plan of care(MBSS TBD)       GO                 [name], MS, CCC-SLP [name] 06/24/2019, 5:13 PM

## 2019-06-24 NOTE — Progress Notes (Signed)
Patient having some SOB this morning.   Sats were at 86-87 until finally going up to 90%.   Gave breathing treatment and sat up right more.  MD is aware-  Pt report breathing feels better after neb.   Will recheck O2 sats.

## 2019-06-24 NOTE — Progress Notes (Signed)
Removed urinary catheter (ok per MD).  Placed Condom cath.  Will monitor urination

## 2019-06-24 NOTE — Anesthesia Postprocedure Evaluation (Signed)
Anesthesia Post Note  Patient: Robert [name]  Procedure(s) Performed: CYSTOSCOPY WITH STENT PLACEMENT (Left Ureter) CYSTOSCOPY WITH RETROGRADE PYELOGRAM (Left Ureter)  Patient location during evaluation: PACU Anesthesia Type: General Level of consciousness: awake and alert Pain management: pain level controlled Vital Signs Assessment: post-procedure vital signs reviewed and stable Respiratory status: spontaneous breathing and respiratory function stable Cardiovascular status: stable Anesthetic complications: no     Last Vitals:  Vitals:   06/23/19 1946 06/24/19 0525  BP: 140/62 (!) 156/86  Pulse: 79 72  Resp:    Temp: 36.9 C 36.8 C  SpO2: 91% 90%    Last Pain:  Vitals:   06/24/19 0525  TempSrc: Oral  PainSc:                  [name] K

## 2019-06-24 NOTE — Evaluation (Signed)
Physical Therapy Evaluation Patient Details Name: Robert [name] MRN: [id number] DOB: March 20, 1921 Today's Date: 06/24/2019   History of Present Illness  Pt is a 86 y.o. 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  Patient was brought in this morning for evaluation of mental status changes which the wife note prior to his admission.  Pt is now s/p cystocopy with L ureteral stent placement (06/21/2019).    Clinical Impression  Pt alert, oriented, HOH. Difficult to assess confusion/AMS versus HOH but wife stated his mental status seemed better than yesterday. Per wife pt was working with PT/OT at home and was walking with a rollator, wife assists with ADLs.  The patient needed significant time to transfer to EOB, modA to complete, Pt sat EOB for several minutes with fair balance, on 4L. With time pt able to improve to 90% (initally 86% after transfer). Sit <> Stand with RW and modA. Pt challenged by fatigue and SOB during transfer to recliner. Also with significant difficulties with stepping backwards to recliner once turned, modA with RW to accomplish safely. SpO2 83% after transfer, recovered to 92% on 6L, and placed on 5L with RN permission to be at least 90%.  Overall the patient demonstrated deficits (see "PT Problem List") that impede the patient's functional abilities, safety, and mobility and would benefit from skilled PT intervention. Recommendation is SNF due to acute decline in functional status and current level of assistance needed.     Follow Up Recommendations SNF    Equipment Recommendations  Rolling walker with 5" wheels    Recommendations for Other Services OT consult     Precautions / Restrictions Precautions Precautions: Fall Restrictions Weight Bearing Restrictions: No      Mobility  Bed Mobility Overal bed mobility: Needs Assistance Bed Mobility: Supine to Sit     Supine to  sit: Mod assist;HOB elevated        Transfers Overall transfer level: Needs assistance Equipment used: Rolling walker (2 wheeled) Transfers: Sit to/from Stand Sit to Stand: Mod assist;From elevated surface         General transfer comment: pt pulls heavily on RW  Ambulation/Gait Ambulation/Gait assistance: Mod assist Gait Distance (Feet): 2 Feet Assistive device: Rolling walker (2 wheeled)       General Gait Details: Pt challenged by fatigue and SOB during transfer to recliner. Also with significant difficulties with stepping backwards to recliner once turned, modA to accomplish safely.  Stairs            Wheelchair Mobility    Modified Rankin (Stroke Patients Only)       Balance Overall balance assessment: Needs assistance Sitting-balance support: Feet supported Sitting balance-Leahy Scale: Fair       Standing balance-Leahy Scale: Poor                               Pertinent Vitals/Pain Pain Assessment: No/denies pain    Home Living Family/patient expects to be discharged to:: Private residence Living Arrangements: Spouse/significant other Available Help at Discharge: Family;Available 24 hours/day Type of Home: Independent living facility Home Access: Level entry     Home Layout: One level Home Equipment: Shower seat;Grab bars - toilet;Grab bars - tub/shower;Walker - 4 wheels;Cane - single point;Bedside commode      Prior Function Level of Independence: Needs assistance   Gait / Transfers Assistance Needed: Pt has been working with  PT/OT, able to ambulate household distances with rollator  ADL's / Homemaking Assistance Needed: Pt needs assistances with ADLs  Comments: utilizes transport chair for community ambulation     Hand Dominance        Extremity/Trunk Assessment   Upper Extremity Assessment Upper Extremity Assessment: Generalized weakness    Lower Extremity Assessment Lower Extremity Assessment: Generalized  weakness    Cervical / Trunk Assessment Cervical / Trunk Assessment: Kyphotic  Communication   Communication: HOH  Cognition Arousal/Alertness: Awake/alert Behavior During Therapy: WFL for tasks assessed/performed Overall Cognitive Status: Within Functional Limits for tasks assessed                                        General Comments      Exercises     Assessment/Plan    PT Assessment Patient needs continued PT services  PT Problem List Decreased strength;Decreased knowledge of use of DME;Decreased activity tolerance;Decreased balance;Decreased mobility       PT Treatment Interventions DME instruction;Balance training;Gait training;Neuromuscular re-education;Stair training;Cognitive remediation;Functional mobility training;Patient/family education;Therapeutic activities;Therapeutic exercise    PT Goals (Current goals can be found in the Care Plan section)  Acute Rehab PT Goals Patient Stated Goal: to get better PT Goal Formulation: With patient/family Time For Goal Achievement: 07/08/19 Potential to Achieve Goals: Good    Frequency Min 2X/week   Barriers to discharge        Co-evaluation               AM-PAC PT "6 Clicks" Mobility  Outcome Measure Help needed turning from your back to your side while in a flat bed without using bedrails?: A Little Help needed moving from lying on your back to sitting on the side of a flat bed without using bedrails?: A Lot Help needed moving to and from a bed to a chair (including a wheelchair)?: A Lot Help needed standing up from a chair using your arms (e.g., wheelchair or bedside chair)?: A Lot Help needed to walk in hospital room?: A Lot Help needed climbing 3-5 steps with a railing? : Total 6 Click Score: 12    End of Session Equipment Utilized During Treatment: Gait belt Activity Tolerance: Patient tolerated treatment well;Patient limited by fatigue Patient left: in chair;with family/visitor  present;with call bell/phone within reach;with chair alarm set Nurse Communication: Mobility status PT Visit Diagnosis: Other abnormalities of gait and mobility (R26.89);Muscle weakness (generalized) (M62.81);Difficulty in walking, not elsewhere classified (R26.2)    Time: 1000-1036 PT Time Calculation (min) (ACUTE ONLY): 36 min   Charges:   PT Evaluation $PT Eval Moderate Complexity: 1 Mod PT Treatments $Therapeutic Exercise: 23-37 mins       Lieutenant Diego PT, DPT 11:37 AM,06/24/19

## 2019-06-24 NOTE — Progress Notes (Signed)
PROGRESS NOTE                                                                                                                                                                                                             Patient Demographics:    Robert [name], is a 86 y.o. male, DOB - [birth year], TYV:[id number]  Admit date - 06/21/2019   Admitting Physician Collier Bullock, MD  Outpatient Primary MD for the patient is Dion Body, MD  LOS - 3    Chief Complaint  Patient presents with  . Altered Mental Status       Brief Narrative 86 year old male with history of CVA, TIAs, hypertension and nephrolithiasis presented with acute change in his mental status of 1 day duration.  Patient having poor p.o. intake and nausea with low back pain, subsequently developed chills but no fever. In the ED he has significant leukocytosis with WBC of 20 K, UA Sater UTI, elevated lactic acid of 2.9.  Patient met criteria for sepsis.  CT scan with renal study showed 4 mm calculus at this into the left UPJ with mild proximal left hydroureteronephrosis. Patient given IV fluids.  Sepsis pathway initiated.  Empiric antibiotic started and urology consulted. Patient taken to the OR for cystoscopy with retrograde pyelogram and stent placement.   Subjective:  Still requiring 4 L via nasal cannula and having cough with congestion.  Assessment  & Plan :    Principal Problem: Severe sepsis ([place]) Sepsis currently resolved. Secondary to UTI with bacteremia. Blood culture 3/4 growing GNR, E. Coli/E faecalis.  Sensitivity still pending.  Urine culture growing E. coli resistant to penicillin, quinolone and Bactrim.  Continue IV meropenem, IV vancomycin added for aspiration pneumonia   Active problems Acute respiratory failure with hypoxia ([facility]) Aspiration pneumonia into the right upper lung Patient went into respiratory distress requiring up to 6 L  on 3/7.  Chest exercises were right upper lobe infiltrate.  Given IV Lasix and broaden antibiotic coverage by adding IV vancomycin. Currently maintaining sats on 4 L.  Keep n.p.o. until repeat swallow evaluation.  Intermittent IV Lasix and nebs. Palliative care consulted for goals of care discussion.    Hydronephrosis with urinary obstruction due to ureteral calculus S/p retrograde pyelogram and left ureteral stent placement.  Discontinue Foley.  Urology following.  Acute kidney injury Likely combination  of ATN with sepsis and postobstructive.  Received IV hydration and resolved.  Acute pulmonary edema Secondary to volume overload with IV hydration.  Intermittent IV Lasix.    Acute metabolic encephalopathy Secondary to sepsis.  Resolved.  Essential hypertension Resume amlodipine.  History of CVA Continue aspirin and statin.  Thrombocytopenia Mild, possibly due to sepsis.  Monitor for now.   Code Status : Full code.  Discussed in detail with patient and his wife and patient wants to have full scope of treatment.  Palliative care consulted for goals of care discussion.  Family Communication  : Wife involved in care  Disposition Plan  : Seen by PT and recommends SNF.  Possibly in the next 48 hours if respiratory symptoms improve  Barriers For Discharge : Bacteremia, acute respiratory failure with hypoxia, aspiration pneumonia  Consults  : Urology  Procedures  : CT renal study, left ureteral stent  DVT Prophylaxis  :  Lovenox -   Lab Results  Component Value Date   PLT 93 (L) 06/23/2019    Antibiotics  :    Anti-infectives (From admission, onward)   Start     Dose/Rate Route Frequency Ordered Stop   06/24/19 1700  vancomycin (VANCOREADY) IVPB 1250 mg/250 mL     1,250 mg 166.7 mL/hr over 90 Minutes Intravenous Every 24 hours 06/23/19 1637     06/23/19 1700  vancomycin (VANCOREADY) IVPB 2000 mg/400 mL     2,000 mg 200 mL/hr over 120 Minutes Intravenous  Once 06/23/19  1630 06/23/19 1959   06/22/19 1000  ceFEPIme (MAXIPIME) 2 g in sodium chloride 0.9 % 100 mL IVPB  Status:  Discontinued     2 g 200 mL/hr over 30 Minutes Intravenous Every 24 hours 06/21/19 1036 06/21/19 2213   06/21/19 2215  meropenem (MERREM) 1 g in sodium chloride 0.9 % 100 mL IVPB     1 g 200 mL/hr over 30 Minutes Intravenous Every 12 hours 06/21/19 2213     06/21/19 1100  vancomycin (VANCOREADY) IVPB 750 mg/150 mL     750 mg 150 mL/hr over 60 Minutes Intravenous  Once 06/21/19 1028 06/21/19 1208   06/21/19 1015  ceFEPIme (MAXIPIME) 2 g in sodium chloride 0.9 % 100 mL IVPB     2 g 200 mL/hr over 30 Minutes Intravenous  Once 06/21/19 1013 06/21/19 1115   06/21/19 0830  cefTRIAXone (ROCEPHIN) 1 g in sodium chloride 0.9 % 100 mL IVPB     1 g 200 mL/hr over 30 Minutes Intravenous  Once 06/21/19 0820 06/21/19 0924   06/21/19 0830  vancomycin (VANCOCIN) IVPB 1000 mg/200 mL premix     1,000 mg 200 mL/hr over 60 Minutes Intravenous STAT 06/21/19 0829 06/21/19 1022        Objective:   Vitals:   06/24/19 0525 06/24/19 0818 06/24/19 0841 06/24/19 0945  BP: (!) 156/86 (!) 156/80  (!) 135/47  Pulse: 72 84  95  Resp:  17  17  Temp: 98.2 F (36.8 C) 99.8 F (37.7 C)  98.4 F (36.9 C)  TempSrc: Oral Oral  Oral  SpO2: 90% 90% 90% 92%  Weight:      Height:        Wt Readings from Last 3 Encounters:  06/23/19 89.1 kg  01/28/19 89.8 kg  04/03/18 92.1 kg     Intake/Output Summary (Last 24 hours) at 06/24/2019 1149 Last data filed at 06/24/2019 0528 Gross per 24 hour  Intake 200 ml  Output 2250 ml  Net -  2050 ml  Physical exam Elderly male, fatigued HEENT: Moist mucosa, supple neck Chest: Coarse crackles over bilateral lung base (L >R) few scattered rhonchi CVs: Normal S1-S2, no murmurs GI: Soft, nondistended, nontender Musculoskeletal: Warm, no edema     Data Review:    CBC Recent Labs  Lab 06/21/19 0705 06/22/19 0529 06/23/19 0508  WBC 21.3* 8.8 8.9  HGB 12.6* 12.0*  11.6*  HCT 38.1* 36.0* 36.0*  PLT 135* 102* 93*  MCV 96.5 98.6 98.6  MCH 31.9 32.9 31.8  MCHC 33.1 33.3 32.2  RDW 14.5 14.7 14.6  LYMPHSABS 0.3*  --   --   MONOABS 0.7  --   --   EOSABS 0.1  --   --   BASOSABS 0.1  --   --     Chemistries  Recent Labs  Lab 06/21/19 0705 06/22/19 0529 06/23/19 0508  NA 142 143 142  K 4.2 4.1 3.9  CL 112* 114* 112*  CO2 20* 22 20*  GLUCOSE 101* 94 95  BUN 28* 27* 23  CREATININE 1.54* 1.37* 1.10  CALCIUM 9.0 7.8* 8.2*  AST 38  --   --   ALT 16  --   --   ALKPHOS 57  --   --   BILITOT 1.3*  --   --    ------------------------------------------------------------------------------------------------------------------ No results for input(s): CHOL, HDL, LDLCALC, TRIG, CHOLHDL, LDLDIRECT in the last 72 hours.  No results found for: HGBA1C ------------------------------------------------------------------------------------------------------------------ No results for input(s): TSH, T4TOTAL, T3FREE, THYROIDAB in the last 72 hours.  Invalid input(s): FREET3 ------------------------------------------------------------------------------------------------------------------ No results for input(s): VITAMINB12, FOLATE, FERRITIN, TIBC, IRON, RETICCTPCT in the last 72 hours.  Coagulation profile Recent Labs  Lab 06/22/19 0529  INR 1.2    No results for input(s): DDIMER in the last 72 hours.  Cardiac Enzymes No results for input(s): CKMB, TROPONINI, MYOGLOBIN in the last 168 hours.  Invalid input(s): CK ------------------------------------------------------------------------------------------------------------------ No results found for: BNP  Inpatient Medications  Scheduled Meds: . amLODipine  5 mg Oral Daily  . aspirin EC  81 mg Oral Daily  . atorvastatin  10 mg Oral QHS  . Chlorhexidine Gluconate Cloth  6 each Topical Daily  . enoxaparin (LOVENOX) injection  40 mg Subcutaneous Q24H  . multivitamin with minerals  1 tablet Oral Daily    . omega-3 acid ethyl esters  2 g Oral Daily  . senna  1 tablet Oral BID  . sodium chloride flush  3 mL Intravenous Q12H  . tamsulosin  0.4 mg Oral Daily   Continuous Infusions: . meropenem (MERREM) IV 1 g (06/24/19 0855)  . vancomycin     PRN Meds:.acetaminophen **OR** acetaminophen, albuterol, ondansetron **OR** ondansetron (ZOFRAN) IV  Micro Results Recent Results (from the past 240 hour(s))  Blood Culture (routine x 2)     Status: Abnormal   Collection Time: 06/21/19  7:05 AM   Specimen: BLOOD  Result Value Ref Range Status   Specimen Description   Final    BLOOD L FARM Performed at [facility], [address], [place], [place]  [address]    Special Requests   Final    BOTTLES DRAWN AEROBIC AND ANAEROBIC Blood Culture adequate volume Performed at [facility], [address] [place], [place] [address]    Culture  Setup Time   Final    IN BOTH AEROBIC AND ANAEROBIC BOTTLES GRAM NEGATIVE RODS CRITICAL RESULT CALLED TO, READ BACK BY AND VERIFIED WITH: LISA KLUTTZ ON 06/21/19 AT 2006 [facility] Performed at  [facility], [place] [address] [place], [place] [address]    Culture ESCHERICHIA COLI (A)  Final   Report Status 06/24/2019 FINAL  Final   Organism ID, Bacteria ESCHERICHIA COLI  Final      Susceptibility   Escherichia coli - MIC*    AMPICILLIN >=32 RESISTANT Resistant     CEFAZOLIN <=4 SENSITIVE Sensitive     CEFEPIME <=0.12 SENSITIVE Sensitive     CEFTAZIDIME <=1 SENSITIVE Sensitive     CEFTRIAXONE <=0.25 SENSITIVE Sensitive     CIPROFLOXACIN >=4 RESISTANT Resistant     GENTAMICIN <=1 SENSITIVE Sensitive     IMIPENEM <=0.25 SENSITIVE Sensitive     TRIMETH/SULFA >=320 RESISTANT Resistant     AMPICILLIN/SULBACTAM >=32 RESISTANT Resistant     PIP/TAZO <=4 SENSITIVE Sensitive     * ESCHERICHIA COLI  Blood Culture (routine x 2)     Status: Abnormal   Collection Time: 06/21/19  7:05 AM   Specimen: BLOOD  Result Value Ref Range Status   Specimen  Description   Final    BLOOD R AC Performed at [facility], [address], [place], [place] [address]    Special Requests   Final    BOTTLES DRAWN AEROBIC AND ANAEROBIC Blood Culture adequate volume Performed at [facility], [address] [place], [place] [address]    Culture  Setup Time   Final    IN BOTH AEROBIC AND ANAEROBIC BOTTLES GRAM NEGATIVE RODS CRITICAL VALUE NOTED.  VALUE IS CONSISTENT WITH PREVIOUSLY REPORTED AND CALLED VALUE. Performed at [facility], [address] [place], [place] [address]    Culture (A)  Final    ESCHERICHIA COLI SUSCEPTIBILITIES PERFORMED ON PREVIOUS CULTURE WITHIN THE LAST 5 DAYS. Performed at [facility], [place] [address], [place], [place] [address]    Report Status 06/24/2019 FINAL  Final  Urine culture     Status: Abnormal   Collection Time: 06/21/19  7:05 AM   Specimen: In/Out Cath Urine  Result Value Ref Range Status   Specimen Description   Final    IN/OUT CATH URINE Performed at [facility], [address] [place], [place] [address]    Special Requests   Final    NONE Performed at [facility], [address] [place], [place] [address]    Culture >=100,000 COLONIES/mL ESCHERICHIA COLI (A)  Final   Report Status 06/23/2019 FINAL  Final   Organism ID, Bacteria ESCHERICHIA COLI (A)  Final      Susceptibility   Escherichia coli - MIC*    AMPICILLIN >=32 RESISTANT Resistant     CEFAZOLIN <=4 SENSITIVE Sensitive     CEFTRIAXONE <=0.25 SENSITIVE Sensitive     CIPROFLOXACIN >=4 RESISTANT Resistant     GENTAMICIN <=1 SENSITIVE Sensitive     IMIPENEM <=0.25 SENSITIVE Sensitive     NITROFURANTOIN <=16 SENSITIVE Sensitive     TRIMETH/SULFA >=320 RESISTANT Resistant     AMPICILLIN/SULBACTAM 16 INTERMEDIATE Intermediate     PIP/TAZO <=4 SENSITIVE Sensitive     * >=100,000 COLONIES/mL ESCHERICHIA COLI  Blood Culture ID Panel (Reflexed)     Status: Abnormal   Collection Time:  06/21/19  7:05 AM  Result Value Ref Range Status   Enterococcus species NOT DETECTED NOT DETECTED Final   Listeria monocytogenes NOT DETECTED NOT DETECTED Final   Staphylococcus species NOT DETECTED NOT DETECTED Final   Staphylococcus aureus (BCID) NOT DETECTED NOT DETECTED Final   Streptococcus species NOT DETECTED NOT  DETECTED Final   Streptococcus agalactiae NOT DETECTED NOT DETECTED Final   Streptococcus pneumoniae NOT DETECTED NOT DETECTED Final   Streptococcus pyogenes NOT DETECTED NOT DETECTED Final   Acinetobacter baumannii NOT DETECTED NOT DETECTED Final   Enterobacteriaceae species DETECTED (A) NOT DETECTED Final    Comment: Enterobacteriaceae represent a large family of gram-negative bacteria, not a single organism. CRITICAL RESULT CALLED TO, READ BACK BY AND VERIFIED WITH: LISA KLUTTZ ON 06/21/19 AT 2006 [facility]    Enterobacter cloacae complex NOT DETECTED NOT DETECTED Final   Escherichia coli DETECTED (A) NOT DETECTED Final    Comment: CRITICAL RESULT CALLED TO, READ BACK BY AND VERIFIED WITH: LISA KLUTTZ ON 06/21/19 AT 2006 [facility]    Klebsiella oxytoca NOT DETECTED NOT DETECTED Final   Klebsiella pneumoniae NOT DETECTED NOT DETECTED Final   Proteus species NOT DETECTED NOT DETECTED Final   Serratia marcescens NOT DETECTED NOT DETECTED Final   Carbapenem resistance NOT DETECTED NOT DETECTED Final   Haemophilus influenzae NOT DETECTED NOT DETECTED Final   Neisseria meningitidis NOT DETECTED NOT DETECTED Final   Pseudomonas aeruginosa NOT DETECTED NOT DETECTED Final   Candida albicans NOT DETECTED NOT DETECTED Final   Candida glabrata NOT DETECTED NOT DETECTED Final   Candida krusei NOT DETECTED NOT DETECTED Final   Candida parapsilosis NOT DETECTED NOT DETECTED Final   Candida tropicalis NOT DETECTED NOT DETECTED Final    Comment: Performed at [facility], [address] [place], [place] [address]  Respiratory Panel by RT PCR (Flu A&B, Covid) - Nasopharyngeal Swab      Status: None   Collection Time: 06/21/19  9:44 AM   Specimen: Nasopharyngeal Swab  Result Value Ref Range Status   SARS Coronavirus 2 by RT PCR NEGATIVE NEGATIVE Final    Comment: (NOTE) SARS-CoV-2 target nucleic acids are NOT DETECTED. The SARS-CoV-2 RNA is generally detectable in upper respiratoy specimens during the acute phase of infection. The lowest concentration of SARS-CoV-2 viral copies this assay can detect is 131 copies/mL. A negative result does not preclude SARS-Cov-2 infection and should not be used as the sole basis for treatment or other patient management decisions. A negative result may occur with  improper specimen collection/handling, submission of specimen other than nasopharyngeal swab, presence of viral mutation(s) within the areas targeted by this assay, and inadequate number of viral copies (<131 copies/mL). A negative result must be combined with clinical observations, patient history, and epidemiological information. The expected result is Negative. Fact Sheet for Patients:  PinkCheek.be Fact Sheet for Healthcare Providers:  GravelBags.it This test is not yet ap proved or cleared by the [place] FDA and  has been authorized for detection and/or diagnosis of SARS-CoV-2 by FDA under an Emergency Use Authorization (EUA). This EUA will remain  in effect (meaning this test can be used) for the duration of the COVID-19 declaration under Section 564(b)(1) of the Act, 21 U.S.C. section 360bbb-3(b)(1), unless the authorization is terminated or revoked sooner.    Influenza A by PCR NEGATIVE NEGATIVE Final   Influenza B by PCR NEGATIVE NEGATIVE Final    Comment: (NOTE) The Xpert Xpress SARS-CoV-2/FLU/RSV assay is intended as an aid in  the diagnosis of influenza from Nasopharyngeal swab specimens and  should not be used as a sole basis for treatment. Nasal washings and  aspirates are unacceptable for  Xpert Xpress SARS-CoV-2/FLU/RSV  testing. Fact Sheet for Patients: PinkCheek.be Fact Sheet for Healthcare Providers: GravelBags.it This test is not yet approved or cleared by the  [place] Architectural technologist and  has been authorized for detection and/or diagnosis of SARS-CoV-2 by  FDA under an Print production planner (EUA). This EUA will remain  in effect (meaning this test can be used) for the duration of the  Covid-19 declaration under Section 564(b)(1) of the Act, 21  U.S.C. section 360bbb-3(b)(1), unless the authorization is  terminated or revoked. Performed at [facility], [address], [place], [place] [address]     Radiology Reports DG Abd 1 View  Result Date: 06/22/2019 CLINICAL DATA:  Obstructing nephrolithiasis. EXAM: ABDOMEN - 1 VIEW COMPARISON:  Abdomen and pelvis CT obtained earlier today. FINDINGS: Normal bowel gas pattern. Interval left ureteral stent in satisfactory position. No radiographically visible calculi. Bilateral hip prostheses. Thoracic and lumbar spine degenerative changes and mild scoliosis. IMPRESSION: No acute abnormality. Interval left ureteral stent in satisfactory position. Electronically Signed   By: Claudie Revering M.D.   On: 06/22/2019 13:49   DG Chest Port 1 View  Result Date: 06/23/2019 CLINICAL DATA:  86 year old male with shortness of breath. EXAM: PORTABLE CHEST 1 VIEW COMPARISON:  Chest radiograph dated 06/21/2019. FINDINGS: Diffuse interstitial prominence and hazy airspace densities throughout the lungs, with a newly developed area of airspace opacity in the right upper lung field most concerning for pneumonia. Clinical correlation and follow-up recommended. A small left pleural effusion may be present. There is no pneumothorax. There is mild cardiomegaly. Atherosclerotic calcification of the aorta. No acute osseous pathology. IMPRESSION: Findings most concerning for pneumonia. Clinical  correlation and follow-up recommended. Electronically Signed   By: Anner Crete M.D.   On: 06/23/2019 15:42   DG Chest Port 1 View  Result Date: 06/21/2019 CLINICAL DATA:  Altered mental status EXAM: PORTABLE CHEST 1 VIEW COMPARISON:  02/19/2018 chest radiograph. FINDINGS: Stable cardiomediastinal silhouette with normal heart size. No pneumothorax. No significant pleural effusions. No pulmonary edema. Mild bibasilar scarring versus atelectasis, unchanged. No acute consolidative airspace disease. IMPRESSION: Stable mild bibasilar scarring versus atelectasis. Electronically Signed   By: Ilona Sorrel M.D.   On: 06/21/2019 08:00   DG OR UROLOGY CYSTO IMAGE ([facility] ONLY)  Result Date: 06/21/2019 There is no interpretation for this exam.  This order is for images obtained during a surgical procedure.  Please See "Surgeries" Tab for more information regarding the procedure.   CT Renal Stone Study  Result Date: 06/21/2019 CLINICAL DATA:  86 year old male with history of left-sided flank pain. Suspected kidney stones. EXAM: CT ABDOMEN AND PELVIS WITHOUT CONTRAST TECHNIQUE: Multidetector CT imaging of the abdomen and pelvis was performed following the standard protocol without IV contrast. COMPARISON:  CT the abdomen and pelvis 02/19/2018. FINDINGS: Lower chest: Mild chronic scarring in the left lower lobe, similar to the prior study. Aortic atherosclerosis. Calcifications of the aortic valve and mitral annulus. Hepatobiliary: No definite cystic or solid hepatic lesions are confidently identified on today's noncontrast CT examination. Calcified gallstones are noted in the gallbladder measuring up to 2.2 cm in diameter. No findings to suggest an acute cholecystitis at this time. Pancreas: No definite pancreatic mass or peripancreatic fluid collections or inflammatory changes are noted on today's noncontrast CT examination. Spleen: Unremarkable. Adrenals/Urinary Tract: Nonobstructive calculi are noted within the  collecting systems of both kidneys, largest of which is in the lower pole of the right kidney measuring 1.1 cm in diameter. In addition, there is a 4 mm calculus adjacent to the left ureterovesicular junction in the distal third of the left ureter (axial image 77 of series 2). This is  associated with minimal proximal left hydroureteronephrosis indicative of very mild obstruction. Unenhanced appearance of the kidneys is otherwise unremarkable. Urinary bladder is normal in appearance. Bilateral adrenal glands are normal in appearance. Stomach/Bowel: Unenhanced appearance of the stomach is normal. No pathologic dilatation of small bowel or colon. Normal appendix. Vascular/Lymphatic: Aortic atherosclerosis. No lymphadenopathy noted in the abdomen or pelvis. Reproductive: Prostate gland and seminal vesicles are poorly visualized secondary to beam hardening artifact from the patient's bilateral hip arthroplasties. Other: No significant volume of ascites.  No pneumoperitoneum. Musculoskeletal: Status post bilateral hip arthroplasty. There are no aggressive appearing lytic or blastic lesions noted in the visualized portions of the skeleton. IMPRESSION: 1. 4 mm calculus adjacent to the left ureterovesicular junction with very mild proximal left hydroureteronephrosis indicative of very mild obstruction. 2. Other nonobstructive calculi in the collecting systems of the kidneys bilaterally measuring up to 1.1 cm in diameter in the lower pole collecting system of the right kidney. 3. Cholelithiasis without evidence of acute cholecystitis at this time. 4. Aortic atherosclerosis. 5. There are calcifications of the aortic valve and mitral annulus. Echocardiographic correlation for evaluation of potential valvular dysfunction may be warranted if clinically indicated. Electronically Signed   By: Vinnie Langton M.D.   On: 06/21/2019 09:11    Time Spent in minutes 35   Latissa Frick M.D on 06/24/2019 at 11:49 AM  Between 7am  to 7pm - Pager - [phone]  After 7pm go to [email or web address] - password [facility]  [facility] -  Office  [phone]

## 2019-06-24 NOTE — Plan of Care (Signed)

## 2019-06-24 NOTE — Progress Notes (Signed)
Pharmacy Antibiotic Note  Robert [name] is a 86 y.o. male admitted on 06/21/2019 with aspiration pneumonia and E. Coli bacteremia. Patient was admitted with hydronephrosis secondary to ureteral stone and is s/p retrograde pyelogram and left ureteral stenting on 3/5. Pharmacy has been consulted for Vancomycin dosing for pneumonia. Patient initially on meropenem now narrowed to ceftriaxone and metronidazole.  Patient requiring 5L supplemental oxygen (baseline none). Afebrile x24h. PCT 38.90 >> 27.72. WBC 21.3 >> 8.9. Creatinine improving.   Day 4 of appropriate antimicrobial coverage.   Plan: Vancomycin 1250 mg IV Q24H  AUC = 498.1 Vanc trough = 13.3   Daily Scr per protocol.   Height: 5\' 11"  (180.3 cm) Weight: 196 lb 6.9 oz (89.1 kg) IBW/kg (Calculated) : 75.3  Temp (24hrs), Avg:98.6 F (37 C), Min:98.2 F (36.8 C), Max:99.8 F (37.7 C)  Recent Labs  Lab 06/21/19 0705 06/21/19 1008 06/22/19 0529 06/23/19 0508  WBC 21.3*  --  8.8 8.9  CREATININE 1.54*  --  1.37* 1.10  LATICACIDVEN 2.9* 2.4*  --   --     Estimated Creatinine Clearance: 41.8 mL/min (by C-G formula based on SCr of 1.1 mg/dL).    Allergies  Allergen Reactions  . Morphine And Related Nausea And Vomiting  . Tramadol Nausea And Vomiting  . Sulfamethoxazole-Trimethoprim Rash    Antimicrobials this admission: Cefepime 3/5 x1 Ceftriaxone 3/5 x1 Meropenem 3/5 >> 3/8 Vancomycin 3/5 x1, 3/7 >>  Dose adjustments this admission: n/a  Microbiology results: 3/5 BCx: E coli (4/4 bottles) 3/5 UCx:  >100k colonies/mL E coli 3/8 MRSA PCR: negative  Thank you for allowing pharmacy to be a part of this patient's care.  [place] Resident 06/24/2019 2:45 PM

## 2019-06-24 NOTE — Consult Note (Addendum)
Consultation Note Date: 06/24/2019   Patient Name: Robert [name]  DOB: 05-03-1920  MRN: [id number]  Age / Sex: 86 y.o., male  PCP: Dion Body, MD Referring Physician: Louellen Molder, MD  Reason for Consultation: Establishing goals of care  HPI/Patient Profile:  CONSUELO [name] is a 86 y.o. 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  Patient was brought in this morning for evaluation of mental status changes which the wife noted 1 day prior to his admission.    Clinical Assessment and Goals of Care:  Patient is sitting in bedside chair. His wife is at bedside. Upon introducing myself and role, she states he was followed by palliative previously, but because they were just talking with them and not physically doing anything with him, she opted out of palliative care. She states they helped with getting PT/OT.   She states they have 7 children. 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 He speaks Mandrin and learned how to play multiple instruments.    Functionally, she states he had been improving with PT/OT, but required assistance with bathing. His appetite has not been good, he has blindness in 1 eye, and he uses hearing aids. She describes him as an a independent and private man, and does not want anyone to touch him.   She states due to her own illness, when she required a care giver to come into the home to care for her, he did not like having someone come into the house.    We discussed his diagnoses, and possible prognosis as well as some GOC.  Discussion on the difference between an aggressive medical intervention path and a comfort care path was initiated.  Values and goals of care important to patient and family were attempted to be  elicited. Discussed limitations of medical interventions to prolong quality of life in some situations and discussed the concept of human mortality.  She is concerned he would not do well in a nursing facility as he did not like residing in one previously.  She voices concern about his being somewhere she cannot see him, and where others would need to care for him. We talked about the helpfulness of a discussion of what QOL would be acceptable to him. She advises currently his hearing aids are charging and the conversation would need to occur at a later time.   She states they are people of faith, and she prays that when the time comes, God will take him quickly before he looses the ability to do things that give him QOL.     Will follow up tomorrow.  SUMMARY OF RECOMMENDATIONS   Working on Software engineer. Continue current treatment.    Prognosis:   Poor overall       Primary Diagnoses: Present on Admission: . Sepsis ([place]) . UTI (urinary tract infection) . HTN (hypertension) . Acute metabolic encephalopathy  I have reviewed the medical record, interviewed the patient and family, and examined the patient. The following aspects are pertinent.  Past Medical History:  Diagnosis Date  . Aortic atherosclerosis ([place]) 03/01/2016  . Arthritis   . 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 High cholesterol   . History of kidney stones   . Hypertension   . TIA (transient ischemic attack)   . 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 Marital status: Married    Spouse name: Not on file  . Number of children: Not on file  . Years of education: Not on file  . Highest education level: Not on file  Occupational History  . Not on file  Tobacco Use  . Smoking status: Former Smoker    Types: Pipe  . Smokeless tobacco: Never Used  Substance and Sexual Activity  . Alcohol use: Yes    Alcohol/week: 7.0 standard drinks    Types: 7 Glasses of wine per  week  . Drug use: No  . Sexual activity: Not on file  Other Topics Concern  . Not on file  Social History Narrative  . Not on file   Social Determinants of Health   Financial Resource Strain:   . Difficulty of Paying Living Expenses: Not on file  Food Insecurity:   . Worried About Charity fundraiser in the Last Year: Not on file  . Ran Out of Food in the Last Year: Not on file  Transportation Needs:   . Lack of Transportation (Medical): Not on file  . Lack of Transportation (Non-Medical): Not on file  Physical Activity:   . Days of Exercise per Week: Not on file  . Minutes of Exercise per Session: Not on file  Stress:   . Feeling of Stress : Not on file  Social Connections:   . Frequency of Communication with Friends and Family: Not on file  . Frequency of Social Gatherings with Friends and Family: Not on file  . Attends Religious Services: Not on file  . Active Member of Clubs or Organizations: Not on file  . Attends Archivist Meetings: Not on file  . Marital Status: Not on file   Family History  Problem Relation Age of Onset  . Prostate cancer Neg Hx   . Bladder Cancer Neg Hx   . Kidney cancer Neg Hx    Scheduled Meds: . amLODipine  5 mg Oral Daily  . aspirin EC  81 mg Oral Daily  . atorvastatin  10 mg Oral QHS  . Chlorhexidine Gluconate Cloth  6 each Topical Daily  . enoxaparin (LOVENOX) injection  40 mg Subcutaneous Q24H  . multivitamin with minerals  1 tablet Oral Daily  . omega-3 acid ethyl esters  2 g Oral Daily  . senna  1 tablet Oral BID  . sodium chloride flush  3 mL Intravenous Q12H  . tamsulosin  0.4 mg Oral Daily   Continuous Infusions: . meropenem (MERREM) IV 1 g (06/24/19 0855)  . vancomycin     PRN Meds:.acetaminophen **OR** acetaminophen, albuterol, ondansetron **OR** ondansetron (ZOFRAN) IV Medications Prior to Admission:  Prior to Admission medications   Medication Sig Start Date End Date Taking? Authorizing Provider  amLODipine  (NORVASC) 5 MG tablet Take 5 mg by mouth daily.   Yes [provider]  aspirin EC 81 MG tablet Take 81 mg by mouth daily.    Yes [provider]  atorvastatin (LIPITOR) 10 MG tablet Take 10 mg by mouth at bedtime.    Yes [provider]  Multiple Vitamin (MULTIVITAMIN WITH MINERALS) TABS tablet Take 1 tablet by mouth daily.   Yes [provider]  Omega-3 Fatty Acids (FISH OIL PO) Take 1 capsule by mouth daily.   Yes [provider]  tamsulosin (FLOMAX) 0.4 MG CAPS capsule Take 0.4 mg by mouth daily.   Yes [provider]  senna (SENOKOT) 8.6 MG TABS tablet Take 1 tablet (8.6 mg total) by mouth 2 (two) times daily. 03/30/18   Vaughan Basta, MD   Allergies  Allergen Reactions  . Morphine And Related Nausea And Vomiting  . Tramadol Nausea And Vomiting  . Sulfamethoxazole-Trimethoprim Rash   Review of Systems  Unable to perform ROS   Physical Exam Pulmonary:     Effort: Pulmonary effort is normal.     Comments: O2 in place. Skin:    General: Skin is warm and dry.  Neurological:     Mental Status: He is alert.     Vital Signs: BP (!) 157/74 (BP Location: Left Arm)   Pulse (!) 40   Temp 98.5 F (36.9 C) (Oral)   Resp 20   Ht 5\' 11"  (1.803 m)   Wt 89.1 kg   SpO2 93%   BMI 27.40 kg/m  Pain Scale: 0-10   Pain Score: 0-No pain   SpO2: SpO2: 93 % O2 Device:SpO2: 93 % O2 Flow Rate: .O2 Flow Rate (L/min): 5 L/min  IO: Intake/output summary:   Intake/Output Summary (Last 24 hours) at 06/24/2019 1231 Last data filed at 06/24/2019 [id number] Gross per 24 hour  Intake 200 ml  Output 2250 ml  Net -2050 ml    LBM: Last BM Date: 06/23/19 Baseline Weight: Weight: 82 kg Most recent weight: Weight: 89.1 kg     Palliative Assessment/Data: 30%     Time In: 10:50 Time Out: 11:40 Time Total 50 min Greater than 50%  of this time was spent counseling and coordinating care related to the above assessment and plan.  Signed  by: Asencion Gowda, NP   Please contact Palliative Medicine Team phone at [phone] for questions and concerns.  For individual provider: See Shea Evans

## 2019-06-25 DIAGNOSIS — L8990 Pressure ulcer of unspecified site, unspecified stage: Secondary | ICD-10-CM | POA: Insufficient documentation

## 2019-06-25 DIAGNOSIS — E876 Hypokalemia: Secondary | ICD-10-CM

## 2019-06-25 LAB — CBC
HCT: 35.7 % — ABNORMAL LOW (ref 39.0–52.0)
Hemoglobin: 12.2 g/dL — ABNORMAL LOW (ref 13.0–17.0)
MCH: 32.4 pg (ref 26.0–34.0)
MCHC: 34.2 g/dL (ref 30.0–36.0)
MCV: 94.7 fL (ref 80.0–100.0)
Platelets: 111 10*3/uL — ABNORMAL LOW (ref 150–400)
RBC: 3.77 MIL/uL — ABNORMAL LOW (ref 4.22–5.81)
RDW: 14 % (ref 11.5–15.5)
WBC: 10.9 10*3/uL — ABNORMAL HIGH (ref 4.0–10.5)
nRBC: 0 % (ref 0.0–0.2)

## 2019-06-25 LAB — BASIC METABOLIC PANEL
Anion gap: 6 (ref 5–15)
BUN: 28 mg/dL — ABNORMAL HIGH (ref 8–23)
CO2: 28 mmol/L (ref 22–32)
Calcium: 8.8 mg/dL — ABNORMAL LOW (ref 8.9–10.3)
Chloride: 110 mmol/L (ref 98–111)
Creatinine, Ser: 0.99 mg/dL (ref 0.61–1.24)
GFR calc Af Amer: >60 mL/min (ref >60)
GFR calc non Af Amer: >60 mL/min (ref >60)
Glucose, Bld: 112 mg/dL — ABNORMAL HIGH (ref 70–99)
Potassium: 3.4 mmol/L — ABNORMAL LOW (ref 3.5–5.1)
Sodium: 144 mmol/L (ref 135–145)

## 2019-06-25 MED ORDER — SODIUM CHLORIDE 0.9 % IV SOLN
INTRAVENOUS | Status: DC | PRN
  Administered 2019-06-26 – 2019-06-27 (×3): 500 mL via INTRAVENOUS

## 2019-06-25 MED ORDER — POTASSIUM CHLORIDE CRYS ER 20 MEQ PO TBCR
40.0000 meq | EXTENDED_RELEASE_TABLET | Freq: Once | ORAL | Status: AC
  Administered 2019-06-25: 40 meq via ORAL
  Filled 2019-06-25: qty 2

## 2019-06-25 NOTE — Progress Notes (Signed)
PROGRESS NOTE                                                                                                                                                                                                             Patient Demographics:    Robert [name], is a 86 y.o. male, DOB - [birth year], BVP:[id number]  Admit date - 06/21/2019   Admitting Physician Collier Bullock, MD  Outpatient Primary MD for the patient is Dion Body, MD  LOS - 4    Chief Complaint  Patient presents with  . Altered Mental Status       Brief Narrative 86 year old male with history of CVA, TIAs, hypertension and nephrolithiasis presented with acute change in his mental status of 1 day duration.  Patient having poor p.o. intake and nausea with low back pain, subsequently developed chills but no fever. In the ED he has significant leukocytosis with WBC of 20 K, UA Sater UTI, elevated lactic acid of 2.9.  Patient met criteria for sepsis.  CT scan with renal study showed 4 mm calculus at this into the left UPJ with mild proximal left hydroureteronephrosis. Patient given IV fluids.  Sepsis pathway initiated.  Empiric antibiotic started and urology consulted. Patient taken to the OR for cystoscopy with retrograde pyelogram and stent placement.  Hospital course prolonged due to development of aspiration pneumonia and acute respiratory failure.   Subjective:   Oxygen weaned to 3 L.  Tolerating well.  Still having cough.  Afebrile.  Assessment  & Plan :    Principal Problem: Severe sepsis ([place]) Sepsis has resolved.  Secondary to E. coli UTI with bacteremia. Now on Rocephin and Flagyl for bacteremia and aspiration pneumonia. Possibly discharge on omnicef +/- flagyl to cover for both aspiration and ecoli bacteremia ( will need omnicef to cover 2 weeks abx for bacteremia).  Active problems Acute respiratory failure with hypoxia (HCC) Aspiration  pneumonia into the right upper lung Went into respiratory distress on 3/7 requiring 6 L nasal cannula.  Chest x-ray showing right upper lobe infiltrate. SLP following and diet change to dysphagia level 3 with thin liquid.  Maintain aspiration precautions.     Hydronephrosis with urinary obstruction due to ureteral calculus S/p retrograde pyelogram and left ureteral stent placement.  Foley removed and voiding without difficulty.  Acute kidney injury Likely combination of ATN  with sepsis and postobstructive.  Resolved with IV hydration.  Acute pulmonary edema Secondary to volume overload with IV hydration.  Intermittent IV Lasix.    Acute metabolic encephalopathy Secondary to sepsis.  Resolved.  Essential hypertension Continue amlodipine.  History of CVA Continue aspirin and statin.  Thrombocytopenia Mild, possibly due to sepsis.  Monitor.  Hypokalemia Replenished    Code Status : Full code.  Palliative care involved in goals of care discussion.  Currently full scope of treatment.  Family Communication  : Wife involved in care  Disposition Plan  : Seen by PT and recommends SNF.  Possibly on 3/11 if respiratory symptoms improve.  Barriers For Discharge : Bacteremia, acute respiratory failure with hypoxia, aspiration pneumonia  Consults  : Urology  Procedures  : CT renal study, left ureteral stent  DVT Prophylaxis  :  Lovenox -   Lab Results  Component Value Date   PLT 111 (L) 06/25/2019    Antibiotics  :    Anti-infectives (From admission, onward)   Start     Dose/Rate Route Frequency Ordered Stop   06/24/19 2200  cefTRIAXone (ROCEPHIN) 2 g in sodium chloride 0.9 % 100 mL IVPB     2 g 200 mL/hr over 30 Minutes Intravenous Every 24 hours 06/24/19 1412     06/24/19 2200  metroNIDAZOLE (FLAGYL) IVPB 500 mg     500 mg 100 mL/hr over 60 Minutes Intravenous Every 8 hours 06/24/19 1412     06/24/19 1700  vancomycin (VANCOREADY) IVPB 1250 mg/250 mL  Status:   Discontinued     1,250 mg 166.7 mL/hr over 90 Minutes Intravenous Every 24 hours 06/23/19 1637 06/25/19 1341   06/23/19 1700  vancomycin (VANCOREADY) IVPB 2000 mg/400 mL     2,000 mg 200 mL/hr over 120 Minutes Intravenous  Once 06/23/19 1630 06/23/19 1959   06/22/19 1000  ceFEPIme (MAXIPIME) 2 g in sodium chloride 0.9 % 100 mL IVPB  Status:  Discontinued     2 g 200 mL/hr over 30 Minutes Intravenous Every 24 hours 06/21/19 1036 06/21/19 2213   06/21/19 2215  meropenem (MERREM) 1 g in sodium chloride 0.9 % 100 mL IVPB  Status:  Discontinued     1 g 200 mL/hr over 30 Minutes Intravenous Every 12 hours 06/21/19 2213 06/24/19 1412   06/21/19 1100  vancomycin (VANCOREADY) IVPB 750 mg/150 mL     750 mg 150 mL/hr over 60 Minutes Intravenous  Once 06/21/19 1028 06/21/19 1208   06/21/19 1015  ceFEPIme (MAXIPIME) 2 g in sodium chloride 0.9 % 100 mL IVPB     2 g 200 mL/hr over 30 Minutes Intravenous  Once 06/21/19 1013 06/21/19 1115   06/21/19 0830  cefTRIAXone (ROCEPHIN) 1 g in sodium chloride 0.9 % 100 mL IVPB     1 g 200 mL/hr over 30 Minutes Intravenous  Once 06/21/19 0820 06/21/19 0924   06/21/19 0830  vancomycin (VANCOCIN) IVPB 1000 mg/200 mL premix     1,000 mg 200 mL/hr over 60 Minutes Intravenous STAT 06/21/19 0829 06/21/19 1022        Objective:   Vitals:   06/25/19 1015 06/25/19 1025 06/25/19 1026 06/25/19 1159  BP:    (!) 147/68  Pulse:    66  Resp:    18  Temp:    98.2 F (36.8 C)  TempSrc:      SpO2: (!) 88% 90% 92% 92%  Weight:      Height:  Wt Readings from Last 3 Encounters:  06/25/19 85.2 kg  01/28/19 89.8 kg  04/03/18 92.1 kg     Intake/Output Summary (Last 24 hours) at 06/25/2019 1421 Last data filed at 06/25/2019 1345 Gross per 24 hour  Intake 989 ml  Output 2050 ml  Net -1061 ml   Physical exam Elderly male fatigued, not in distress HEENT: Moist mucosa, supple neck Chest: Fine crackles over right lung CVs: Normal S1-S2, no murmurs GI: Soft,  nondistended, nontender Musculoskeletal: Warm, no edema      Data Review:    CBC Recent Labs  Lab 06/21/19 0705 06/22/19 0529 06/23/19 0508 06/25/19 0515  WBC 21.3* 8.8 8.9 10.9*  HGB 12.6* 12.0* 11.6* 12.2*  HCT 38.1* 36.0* 36.0* 35.7*  PLT 135* 102* 93* 111*  MCV 96.5 98.6 98.6 94.7  MCH 31.9 32.9 31.8 32.4  MCHC 33.1 33.3 32.2 34.2  RDW 14.5 14.7 14.6 14.0  LYMPHSABS 0.3*  --   --   --   MONOABS 0.7  --   --   --   EOSABS 0.1  --   --   --   BASOSABS 0.1  --   --   --     Chemistries  Recent Labs  Lab 06/21/19 0705 06/22/19 0529 06/23/19 0508 06/25/19 0515  NA 142 143 142 144  K 4.2 4.1 3.9 3.4*  CL 112* 114* 112* 110  CO2 20* 22 20* 28  GLUCOSE 101* 94 95 112*  BUN 28* 27* 23 28*  CREATININE 1.54* 1.37* 1.10 0.99  CALCIUM 9.0 7.8* 8.2* 8.8*  AST 38  --   --   --   ALT 16  --   --   --   ALKPHOS 57  --   --   --   BILITOT 1.3*  --   --   --    ------------------------------------------------------------------------------------------------------------------ No results for input(s): CHOL, HDL, LDLCALC, TRIG, CHOLHDL, LDLDIRECT in the last 72 hours.  No results found for: HGBA1C ------------------------------------------------------------------------------------------------------------------ No results for input(s): TSH, T4TOTAL, T3FREE, THYROIDAB in the last 72 hours.  Invalid input(s): FREET3 ------------------------------------------------------------------------------------------------------------------ No results for input(s): VITAMINB12, FOLATE, FERRITIN, TIBC, IRON, RETICCTPCT in the last 72 hours.  Coagulation profile Recent Labs  Lab 06/22/19 0529  INR 1.2    No results for input(s): DDIMER in the last 72 hours.  Cardiac Enzymes No results for input(s): CKMB, TROPONINI, MYOGLOBIN in the last 168 hours.  Invalid input(s):  CK ------------------------------------------------------------------------------------------------------------------ No results found for: BNP  Inpatient Medications  Scheduled Meds: . amLODipine  5 mg Oral Daily  . aspirin EC  81 mg Oral Daily  . atorvastatin  10 mg Oral QHS  . Chlorhexidine Gluconate Cloth  6 each Topical Daily  . enoxaparin (LOVENOX) injection  40 mg Subcutaneous Q24H  . multivitamin with minerals  1 tablet Oral Daily  . omega-3 acid ethyl esters  2 g Oral Daily  . senna  1 tablet Oral BID  . sodium chloride flush  3 mL Intravenous Q12H  . tamsulosin  0.4 mg Oral Daily   Continuous Infusions: . sodium chloride    . cefTRIAXone (ROCEPHIN)  IV 2 g (06/24/19 2100)  . metronidazole Stopped (06/25/19 0618)   PRN Meds:.sodium chloride, acetaminophen **OR** acetaminophen, albuterol, ondansetron **OR** ondansetron (ZOFRAN) IV  Micro Results Recent Results (from the past 240 hour(s))  Blood Culture (routine x 2)     Status: Abnormal   Collection Time: 06/21/19  7:05 AM   Specimen: BLOOD  Result  Value Ref Range Status   Specimen Description   Final    BLOOD L FARM Performed at [facility], [address] [place], [place] [address]    Special Requests   Final    BOTTLES DRAWN AEROBIC AND ANAEROBIC Blood Culture adequate volume Performed at [facility], [address] [place], [place] [address]    Culture  Setup Time   Final    IN BOTH AEROBIC AND ANAEROBIC BOTTLES GRAM NEGATIVE RODS CRITICAL RESULT CALLED TO, READ BACK BY AND VERIFIED WITH: LISA KLUTTZ ON 06/21/19 AT 2006 [facility] Performed at [facility], [place] [address], [place], [place] [address]    Culture ESCHERICHIA COLI (A)  Final   Report Status 06/24/2019 FINAL  Final   Organism ID, Bacteria ESCHERICHIA COLI  Final      Susceptibility   Escherichia coli - MIC*    AMPICILLIN >=32 RESISTANT Resistant     CEFAZOLIN <=4 SENSITIVE Sensitive     CEFEPIME <=0.12 SENSITIVE  Sensitive     CEFTAZIDIME <=1 SENSITIVE Sensitive     CEFTRIAXONE <=0.25 SENSITIVE Sensitive     CIPROFLOXACIN >=4 RESISTANT Resistant     GENTAMICIN <=1 SENSITIVE Sensitive     IMIPENEM <=0.25 SENSITIVE Sensitive     TRIMETH/SULFA >=320 RESISTANT Resistant     AMPICILLIN/SULBACTAM >=32 RESISTANT Resistant     PIP/TAZO <=4 SENSITIVE Sensitive     * ESCHERICHIA COLI  Blood Culture (routine x 2)     Status: Abnormal   Collection Time: 06/21/19  7:05 AM   Specimen: BLOOD  Result Value Ref Range Status   Specimen Description   Final    BLOOD R AC Performed at [facility], [address] [place], [place] [address]    Special Requests   Final    BOTTLES DRAWN AEROBIC AND ANAEROBIC Blood Culture adequate volume Performed at [facility], [address] [place], [place] [address]    Culture  Setup Time   Final    IN BOTH AEROBIC AND ANAEROBIC BOTTLES GRAM NEGATIVE RODS CRITICAL VALUE NOTED.  VALUE IS CONSISTENT WITH PREVIOUSLY REPORTED AND CALLED VALUE. Performed at [facility], [address] [place], [place] [address]    Culture (A)  Final    ESCHERICHIA COLI SUSCEPTIBILITIES PERFORMED ON PREVIOUS CULTURE WITHIN THE LAST 5 DAYS. Performed at [facility], [place] [address], [place], [place] [address]    Report Status 06/24/2019 FINAL  Final  Urine culture     Status: Abnormal   Collection Time: 06/21/19  7:05 AM   Specimen: In/Out Cath Urine  Result Value Ref Range Status   Specimen Description   Final    IN/OUT CATH URINE Performed at [facility], [address] [place], [place] [address]    Special Requests   Final    NONE Performed at [facility], [address] [place], [place] [address]    Culture >=100,000 COLONIES/mL ESCHERICHIA COLI (A)  Final   Report Status 06/23/2019 FINAL  Final   Organism ID, Bacteria ESCHERICHIA COLI (A)  Final      Susceptibility   Escherichia coli - MIC*    AMPICILLIN >=32  RESISTANT Resistant     CEFAZOLIN <=4 SENSITIVE Sensitive     CEFTRIAXONE <=0.25 SENSITIVE Sensitive     CIPROFLOXACIN >=4 RESISTANT Resistant     GENTAMICIN <=1 SENSITIVE Sensitive     IMIPENEM <=0.25 SENSITIVE Sensitive     NITROFURANTOIN <=16 SENSITIVE Sensitive  TRIMETH/SULFA >=320 RESISTANT Resistant     AMPICILLIN/SULBACTAM 16 INTERMEDIATE Intermediate     PIP/TAZO <=4 SENSITIVE Sensitive     * >=100,000 COLONIES/mL ESCHERICHIA COLI  Blood Culture ID Panel (Reflexed)     Status: Abnormal   Collection Time: 06/21/19  7:05 AM  Result Value Ref Range Status   Enterococcus species NOT DETECTED NOT DETECTED Final   Listeria monocytogenes NOT DETECTED NOT DETECTED Final   Staphylococcus species NOT DETECTED NOT DETECTED Final   Staphylococcus aureus (BCID) NOT DETECTED NOT DETECTED Final   Streptococcus species NOT DETECTED NOT DETECTED Final   Streptococcus agalactiae NOT DETECTED NOT DETECTED Final   Streptococcus pneumoniae NOT DETECTED NOT DETECTED Final   Streptococcus pyogenes NOT DETECTED NOT DETECTED Final   Acinetobacter baumannii NOT DETECTED NOT DETECTED Final   Enterobacteriaceae species DETECTED (A) NOT DETECTED Final    Comment: Enterobacteriaceae represent a large family of gram-negative bacteria, not a single organism. CRITICAL RESULT CALLED TO, READ BACK BY AND VERIFIED WITH: LISA KLUTTZ ON 06/21/19 AT 2006 [facility]    Enterobacter cloacae complex NOT DETECTED NOT DETECTED Final   Escherichia coli DETECTED (A) NOT DETECTED Final    Comment: CRITICAL RESULT CALLED TO, READ BACK BY AND VERIFIED WITH: LISA KLUTTZ ON 06/21/19 AT 2006 [facility]    Klebsiella oxytoca NOT DETECTED NOT DETECTED Final   Klebsiella pneumoniae NOT DETECTED NOT DETECTED Final   Proteus species NOT DETECTED NOT DETECTED Final   Serratia marcescens NOT DETECTED NOT DETECTED Final   Carbapenem resistance NOT DETECTED NOT DETECTED Final   Haemophilus influenzae NOT DETECTED NOT DETECTED Final   Neisseria  meningitidis NOT DETECTED NOT DETECTED Final   Pseudomonas aeruginosa NOT DETECTED NOT DETECTED Final   Candida albicans NOT DETECTED NOT DETECTED Final   Candida glabrata NOT DETECTED NOT DETECTED Final   Candida krusei NOT DETECTED NOT DETECTED Final   Candida parapsilosis NOT DETECTED NOT DETECTED Final   Candida tropicalis NOT DETECTED NOT DETECTED Final    Comment: Performed at [facility], [address] [place], [place] [address]  Respiratory Panel by RT PCR (Flu A&B, Covid) - Nasopharyngeal Swab     Status: None   Collection Time: 06/21/19  9:44 AM   Specimen: Nasopharyngeal Swab  Result Value Ref Range Status   SARS Coronavirus 2 by RT PCR NEGATIVE NEGATIVE Final    Comment: (NOTE) SARS-CoV-2 target nucleic acids are NOT DETECTED. The SARS-CoV-2 RNA is generally detectable in upper respiratoy specimens during the acute phase of infection. The lowest concentration of SARS-CoV-2 viral copies this assay can detect is 131 copies/mL. A negative result does not preclude SARS-Cov-2 infection and should not be used as the sole basis for treatment or other patient management decisions. A negative result may occur with  improper specimen collection/handling, submission of specimen other than nasopharyngeal swab, presence of viral mutation(s) within the areas targeted by this assay, and inadequate number of viral copies (<131 copies/mL). A negative result must be combined with clinical observations, patient history, and epidemiological information. The expected result is Negative. Fact Sheet for Patients:  PinkCheek.be Fact Sheet for Healthcare Providers:  GravelBags.it This test is not yet ap proved or cleared by the [place] FDA and  has been authorized for detection and/or diagnosis of SARS-CoV-2 by FDA under an Emergency Use Authorization (EUA). This EUA will remain  in effect (meaning this test can be used)  for the duration of the COVID-19 declaration under Section 564(b)(1) of the Act, 21 U.S.C. section  360bbb-3(b)(1), unless the authorization is terminated or revoked sooner.    Influenza A by PCR NEGATIVE NEGATIVE Final   Influenza B by PCR NEGATIVE NEGATIVE Final    Comment: (NOTE) The Xpert Xpress SARS-CoV-2/FLU/RSV assay is intended as an aid in  the diagnosis of influenza from Nasopharyngeal swab specimens and  should not be used as a sole basis for treatment. Nasal washings and  aspirates are unacceptable for Xpert Xpress SARS-CoV-2/FLU/RSV  testing. Fact Sheet for Patients: PinkCheek.be Fact Sheet for Healthcare Providers: GravelBags.it This test is not yet approved or cleared by the [place] FDA and  has been authorized for detection and/or diagnosis of SARS-CoV-2 by  FDA under an Emergency Use Authorization (EUA). This EUA will remain  in effect (meaning this test can be used) for the duration of the  Covid-19 declaration under Section 564(b)(1) of the Act, 21  U.S.C. section 360bbb-3(b)(1), unless the authorization is  terminated or revoked. Performed at [facility], [address] [place], [place] [address]   MRSA PCR Screening     Status: None   Collection Time: 06/24/19  7:59 AM   Specimen: Nasal Mucosa; Nasopharyngeal  Result Value Ref Range Status   MRSA by PCR NEGATIVE NEGATIVE Final    Comment:        The GeneXpert MRSA Assay (FDA approved for NASAL specimens only), is one component of a comprehensive MRSA colonization surveillance program. It is not intended to diagnose MRSA infection nor to guide or monitor treatment for MRSA infections. Performed at [facility], [address] [place], [place] [address]     Radiology Reports DG Abd 1 View  Result Date: 06/22/2019 CLINICAL DATA:  Obstructing nephrolithiasis. EXAM: ABDOMEN - 1 VIEW COMPARISON:  Abdomen and pelvis CT  obtained earlier today. FINDINGS: Normal bowel gas pattern. Interval left ureteral stent in satisfactory position. No radiographically visible calculi. Bilateral hip prostheses. Thoracic and lumbar spine degenerative changes and mild scoliosis. IMPRESSION: No acute abnormality. Interval left ureteral stent in satisfactory position. Electronically Signed   By: Claudie Revering M.D.   On: 06/22/2019 13:49   DG Chest Port 1 View  Result Date: 06/23/2019 CLINICAL DATA:  86 year old male with shortness of breath. EXAM: PORTABLE CHEST 1 VIEW COMPARISON:  Chest radiograph dated 06/21/2019. FINDINGS: Diffuse interstitial prominence and hazy airspace densities throughout the lungs, with a newly developed area of airspace opacity in the right upper lung field most concerning for pneumonia. Clinical correlation and follow-up recommended. A small left pleural effusion may be present. There is no pneumothorax. There is mild cardiomegaly. Atherosclerotic calcification of the aorta. No acute osseous pathology. IMPRESSION: Findings most concerning for pneumonia. Clinical correlation and follow-up recommended. Electronically Signed   By: Anner Crete M.D.   On: 06/23/2019 15:42   DG Chest Port 1 View  Result Date: 06/21/2019 CLINICAL DATA:  Altered mental status EXAM: PORTABLE CHEST 1 VIEW COMPARISON:  02/19/2018 chest radiograph. FINDINGS: Stable cardiomediastinal silhouette with normal heart size. No pneumothorax. No significant pleural effusions. No pulmonary edema. Mild bibasilar scarring versus atelectasis, unchanged. No acute consolidative airspace disease. IMPRESSION: Stable mild bibasilar scarring versus atelectasis. Electronically Signed   By: Ilona Sorrel M.D.   On: 06/21/2019 08:00   DG OR UROLOGY CYSTO IMAGE ([facility] ONLY)  Result Date: 06/21/2019 There is no interpretation for this exam.  This order is for images obtained during a surgical procedure.  Please See "Surgeries" Tab for more information regarding the  procedure.   CT Renal Stone Study  Result Date: 06/21/2019 CLINICAL DATA:  86 year old male with history of left-sided flank pain. Suspected kidney stones. EXAM: CT ABDOMEN AND PELVIS WITHOUT CONTRAST TECHNIQUE: Multidetector CT imaging of the abdomen and pelvis was performed following the standard protocol without IV contrast. COMPARISON:  CT the abdomen and pelvis 02/19/2018. FINDINGS: Lower chest: Mild chronic scarring in the left lower lobe, similar to the prior study. Aortic atherosclerosis. Calcifications of the aortic valve and mitral annulus. Hepatobiliary: No definite cystic or solid hepatic lesions are confidently identified on today's noncontrast CT examination. Calcified gallstones are noted in the gallbladder measuring up to 2.2 cm in diameter. No findings to suggest an acute cholecystitis at this time. Pancreas: No definite pancreatic mass or peripancreatic fluid collections or inflammatory changes are noted on today's noncontrast CT examination. Spleen: Unremarkable. Adrenals/Urinary Tract: Nonobstructive calculi are noted within the collecting systems of both kidneys, largest of which is in the lower pole of the right kidney measuring 1.1 cm in diameter. In addition, there is a 4 mm calculus adjacent to the left ureterovesicular junction in the distal third of the left ureter (axial image 77 of series 2). This is associated with minimal proximal left hydroureteronephrosis indicative of very mild obstruction. Unenhanced appearance of the kidneys is otherwise unremarkable. Urinary bladder is normal in appearance. Bilateral adrenal glands are normal in appearance. Stomach/Bowel: Unenhanced appearance of the stomach is normal. No pathologic dilatation of small bowel or colon. Normal appendix. Vascular/Lymphatic: Aortic atherosclerosis. No lymphadenopathy noted in the abdomen or pelvis. Reproductive: Prostate gland and seminal vesicles are poorly visualized secondary to beam hardening artifact from the  patient's bilateral hip arthroplasties. Other: No significant volume of ascites.  No pneumoperitoneum. Musculoskeletal: Status post bilateral hip arthroplasty. There are no aggressive appearing lytic or blastic lesions noted in the visualized portions of the skeleton. IMPRESSION: 1. 4 mm calculus adjacent to the left ureterovesicular junction with very mild proximal left hydroureteronephrosis indicative of very mild obstruction. 2. Other nonobstructive calculi in the collecting systems of the kidneys bilaterally measuring up to 1.1 cm in diameter in the lower pole collecting system of the right kidney. 3. Cholelithiasis without evidence of acute cholecystitis at this time. 4. Aortic atherosclerosis. 5. There are calcifications of the aortic valve and mitral annulus. Echocardiographic correlation for evaluation of potential valvular dysfunction may be warranted if clinically indicated. Electronically Signed   By: Vinnie Langton M.D.   On: 06/21/2019 09:11    Time Spent in minutes 35   Velera [place] M.D on 06/25/2019 at 2:21 PM  Between 7am to 7pm - Pager - [phone]  After 7pm go to [email or web address] - password [facility]  [facility] -  Office  [phone]

## 2019-06-25 NOTE — Progress Notes (Signed)
Physical Therapy Treatment Patient Details Name: Robert [name] MRN: [id number] DOB: 13-Aug-1920 Today's Date: 06/25/2019    History of Present Illness Pt is a 86 y.o. 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  Patient was brought in this morning for evaluation of mental status changes which the wife note prior to his admission.  Pt is now s/p cystocopy with L ureteral stent placement (06/21/2019).    PT Comments    Pt alert, oriented, with working hearing aids this morning, behavior WFLs. The patient with RN at bedside to assess skin and sacral pad. Pt performed supine to sit with HOB elevated, modA for LE management and complete trunk elevation. Pt in sitting on 3L 86-87%, placed on 4L for mobility. The patient performed sit <> stand with modA and RW and was able to stand for several minutes for skin assessment cleanup after BM by RN. Pt performed stand pivot transfer with RW and CGA, very slow but overall steady. Pt SOB and fatigued after transfer, O2 86%, with rest and support >90% in ~31minutes. The patient demonstrated progression towards goals this session and would benefit from continued skilled PT intervention. Recommendation remains appropriate.   Follow Up Recommendations  SNF     Equipment Recommendations  Rolling walker with 5" wheels    Recommendations for Other Services OT consult     Precautions / Restrictions Precautions Precautions: Fall;Other (comment) Precaution Comments: aspiration precautions Restrictions Weight Bearing Restrictions: No    Mobility  Bed Mobility Overal bed mobility: Needs Assistance Bed Mobility: Supine to Sit     Supine to sit: HOB elevated;Mod assist Sit to supine: HOB elevated;Mod assist   General bed mobility comments: assistance for LE management and to complete trunk elevation  Transfers Overall transfer level: Needs assistance Equipment  used: Rolling walker (2 wheeled) Transfers: Sit to/from [facility] Sit to Stand: Mod assist;From elevated surface Stand pivot transfers: Min guard       General transfer comment: Unable to assess  Ambulation/Gait                 Stairs             Wheelchair Mobility    Modified Rankin (Stroke Patients Only)       Balance Overall balance assessment: Needs assistance Sitting-balance support: Feet supported Sitting balance-Leahy Scale: Fair       Standing balance-Leahy Scale: Poor Standing balance comment: reliant on RW                            Cognition Arousal/Alertness: Awake/alert Behavior During Therapy: WFL for tasks assessed/performed Overall Cognitive Status: History of cognitive impairments - at baseline                                 General Comments: Patient polite.  Oriented to name.  Oriented that he was in hospital.      Exercises Other Exercises Other Exercises: Pt able to stand for several minutes with CGA, cues for posture to allow for RN to assess skin/clean BM. Other Exercises: Safety education on use of call light and bed controls Other Exercises: Performed simple grooming at bed level with set up/SBA/SPV.  Transitioned to EOB with MOD A and tolerated EOB for ~4 minutes.  Complaints of increased low back pain. Other Exercises: Set up  for breakfast meal.  Required assistance with dexterity (openning containers and seasoning).  Patient requires SPV due to aspiration precautions at this time.    General Comments        Pertinent Vitals/Pain Pain Assessment: No/denies pain Faces Pain Scale: Hurts a little bit Pain Location: low back Pain Descriptors / Indicators: Aching Pain Intervention(s): Limited activity within patient's tolerance;Monitored during session;Repositioned    Home Living Family/patient expects to be discharged to:: Private residence Living Arrangements:  Spouse/significant other Available Help at Discharge: Family;Available 24 hours/day Type of Home: Independent living facility Home Access: Level entry   Home Layout: One level Home Equipment: Shower seat;Grab bars - toilet;Grab bars - tub/shower;Walker - 4 wheels;Cane - single point;Bedside commode      Prior Function Level of Independence: Needs assistance  Gait / Transfers Assistance Needed: Pt has been working with PT/OT, able to ambulate household distances with rollator ADL's / Homemaking Assistance Needed: Pt needs assistances with ADLs.  Especially bathing. Comments: utilizes transport chair for community ambulation   PT Goals (current goals can now be found in the care plan section) Acute Rehab PT Goals Patient Stated Goal: "do more for myself" Progress towards PT goals: Progressing toward goals    Frequency    Min 2X/week      PT Plan Current plan remains appropriate    Co-evaluation              AM-PAC PT "6 Clicks" Mobility   Outcome Measure  Help needed turning from your back to your side while in a flat bed without using bedrails?: A Little Help needed moving from lying on your back to sitting on the side of a flat bed without using bedrails?: A Lot Help needed moving to and from a bed to a chair (including a wheelchair)?: A Lot Help needed standing up from a chair using your arms (e.g., wheelchair or bedside chair)?: A Lot Help needed to walk in hospital room?: A Lot Help needed climbing 3-5 steps with a railing? : Total 6 Click Score: 12    End of Session Equipment Utilized During Treatment: Gait belt Activity Tolerance: Patient tolerated treatment well;Patient limited by fatigue Patient left: in chair;with family/visitor present;with call bell/phone within reach;with chair alarm set Nurse Communication: Mobility status PT Visit Diagnosis: Other abnormalities of gait and mobility (R26.89);Muscle weakness (generalized) (M62.81);Difficulty in walking,  not elsewhere classified (R26.2)     Time: [id number] PT Time Calculation (min) (ACUTE ONLY): 24 min  Charges:  $Therapeutic Exercise: 23-37 mins                     Lieutenant Diego PT, DPT 10:45 AM,06/25/19

## 2019-06-25 NOTE — Evaluation (Signed)
Occupational Therapy Evaluation Patient Details Name: Robert [name] MRN: [id number] DOB: December 04, 1920 Today's Date: 06/25/2019    History of Present Illness Pt is a 86 y.o. 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  Patient was brought in this morning for evaluation of mental status changes which the wife note prior to his admission.  Pt is now s/p cystocopy with L ureteral stent placement (06/21/2019).   Clinical Impression   Patient seen this AM for OT evaluation.  Patient agreeable to therapy.  Previously, patient lived at home with wife and was participating in OT/PT and making progress.  Patient did require some assistance for BADLs, especially bathing.  Patient with no pain supine but noted low back pain with movement.  Completed simple grooming tasks with set up/SBA at bed level. Patient moved to EOB with HOB elevated with MOD A and extra time with cues for sequencing/safety.  Noted SOB with exertion, but 02 remained WFL for duration of session.  Educated patient on breathing techniques.  Patient tolerated sitting at EOB for ~4 minutes.  Breakfast came and patient wished to eat in bed.  Assisted patient with set up.  Required assistance for FMC/dexterity tasks including opening cartons and spices.  Noted patient is with aspiration precautions and informed nurse that he will need supervision during mealtimes.  Patient would benefit from continued occupational therapy services to address activity tolerance, functional transfers, safety awareness, breathing techniques, strengthening, standing tolerance, ADL retraining, energy conservation techniques, compensatory techniques and general family/patient education training.  Based on today's performance, recommending SNF for follow up.      Follow Up Recommendations  SNF(Wife wishes to take patient home.)    Equipment Recommendations  Other (comment)(defer to  next level of care)    Recommendations for Other Services       Precautions / Restrictions Precautions Precautions: Fall;Other (comment)(aspiration precautions) Precaution Comments: aspiration precautions Restrictions Weight Bearing Restrictions: No      Mobility Bed Mobility Overal bed mobility: Needs Assistance Bed Mobility: Supine to Sit;Sit to Supine     Supine to sit: HOB elevated;Mod assist Sit to supine: HOB elevated;Mod assist   General bed mobility comments: Assistance for LB from sit<>supine. Requires extra time and cues for sequencing/hand placement.  Transfers                 General transfer comment: Unable to assess    Balance   Sitting-balance support: Feet supported Sitting balance-Leahy Scale: Fair                                     ADL either performed or assessed with clinical judgement   ADL Overall ADL's : Needs assistance/impaired Eating/Feeding: Set up;Supervision/ safety;Sitting;Cueing for safety Eating/Feeding Details (indicate cue type and reason): Aspiration precautions, nectar thick liquids, no straws Grooming: Wash/dry hands;Wash/dry face;Set up;Supervision/safety;Bed level   Upper Body Bathing: Moderate assistance;Sitting Upper Body Bathing Details (indicate cue type and reason): Poor activity tolerance Lower Body Bathing: Maximal assistance;Sitting/lateral leans Lower Body Bathing Details (indicate cue type and reason): Poor activity tolerance     Lower Body Dressing: Maximal assistance;Sit to/from stand;Cueing for compensatory techniques;Cueing for sequencing   Toilet Transfer: Moderate assistance;RW   Toileting- Clothing Manipulation and Hygiene: Maximal assistance;Sit to/from stand         General ADL Comments: Requires extra time for all tasks due to  poor activity tolerance.  Becomes SOB easily with exertion.     Vision Patient Visual Report: No change from baseline Additional Comments: Blind in 1  eye     Perception     Praxis      Pertinent Vitals/Pain Pain Assessment: Faces Faces Pain Scale: Hurts a little bit Pain Location: low back Pain Descriptors / Indicators: Aching Pain Intervention(s): Limited activity within patient's tolerance;Monitored during session;Repositioned     Hand Dominance Right   Extremity/Trunk Assessment Upper Extremity Assessment Upper Extremity Assessment: Generalized weakness   Lower Extremity Assessment Lower Extremity Assessment: Defer to PT evaluation   Cervical / Trunk Assessment Cervical / Trunk Assessment: Kyphotic   Communication Communication Communication: HOH   Cognition Arousal/Alertness: Awake/alert Behavior During Therapy: WFL for tasks assessed/performed Overall Cognitive Status: History of cognitive impairments - at baseline                                 General Comments: Patient polite.  Oriented to name.  Oriented that he was in hospital.   General Comments       Exercises Other Exercises Other Exercises: General education on goals of OT in acute setting Other Exercises: Safety education on use of call light and bed controls Other Exercises: Performed simple grooming at bed level with set up/SBA/SPV.  Transitioned to EOB with MOD A and tolerated EOB for ~4 minutes.  Complaints of increased low back pain. Other Exercises: Set up for breakfast meal.  Required assistance with dexterity (openning containers and seasoning).  Patient requires SPV due to aspiration precautions at this time.   Shoulder Instructions      Home Living Family/patient expects to be discharged to:: Private residence Living Arrangements: Spouse/significant other Available Help at Discharge: Family;Available 24 hours/day Type of Home: Independent living facility Home Access: Level entry     Home Layout: One level     Bathroom Shower/Tub: Walk-in shower         Home Equipment: Shower seat;Grab bars - toilet;Grab bars -  tub/shower;Walker - 4 wheels;Cane - single point;Bedside commode          Prior Functioning/Environment Level of Independence: Needs assistance  Gait / Transfers Assistance Needed: Pt has been working with PT/OT, able to ambulate household distances with rollator ADL's / Homemaking Assistance Needed: Pt needs assistances with ADLs.  Especially bathing.   Comments: utilizes Systems analyst for community ambulation        OT Problem List: Decreased strength;Decreased activity tolerance;Decreased safety awareness;Decreased coordination      OT Treatment/Interventions: Self-care/ADL training;Therapeutic exercise;Energy conservation;DME and/or AE instruction;Therapeutic activities;Patient/family education    OT Goals(Current goals can be found in the care plan section) Acute Rehab OT Goals Patient Stated Goal: "do more for myself" OT Goal Formulation: With patient Time For Goal Achievement: 07/09/19 Potential to Achieve Goals: Fair  OT Frequency: Min 1X/week   Barriers to D/C:            Co-evaluation              AM-PAC OT "6 Clicks" Daily Activity     Outcome Measure Help from another person eating meals?: A Little Help from another person taking care of personal grooming?: A Little Help from another person toileting, which includes using toliet, bedpan, or urinal?: A Lot Help from another person bathing (including washing, rinsing, drying)?: A Lot Help from another person to put on and taking off regular upper body  clothing?: A Little Help from another person to put on and taking off regular lower body clothing?: A Lot 6 Click Score: 15   End of Session Nurse Communication: Other (comment)(Spoke to nurse that SLP recommending SPV for meals due to aspiration risk)  Activity Tolerance: Patient tolerated treatment well Patient left: in bed;with call bell/phone within reach;with bed alarm set  OT Visit Diagnosis: Muscle weakness (generalized) (M62.81)                 Time: [id number] OT Time Calculation (min): 17 min Charges:  OT General Charges $OT Visit: 1 Visit OT Evaluation $OT Eval Moderate Complexity: 1 Mod OT Treatments $Self Care/Home Management : 8-22 mins $Therapeutic Activity: 8-22 mins  Baldomero Lamy, MS, OTR/L 06/25/19, 10:39 AM

## 2019-06-25 NOTE — Care Management Important Message (Signed)
Important Message  Patient Details  Name: ANTOINO STAPF MRN: [id number] Date of Birth: 1920-10-01   Medicare Important Message Given:  Yes     Dannette Barbara 06/25/2019, 11:07 AM

## 2019-06-25 NOTE — Addendum Note (Signed)
Addendum  created 06/25/19 0807 by Doreen Salvage, CRNA   Charge Capture section accepted, Visit diagnoses modified

## 2019-06-25 NOTE — Progress Notes (Signed)
Daily Progress Note   Patient Name: Robert [name]       Date: 06/25/2019 DOB: 11-09-1920  Age: 86 y.o. MRN#: [id number] Attending Physician: Louellen Molder, MD Primary Care Physician: Dion Body, MD Admit Date: 06/21/2019  Reason for Consultation/Follow-up: Establishing goals of care  Subjective: Patient is sitting up in bed. He has hearing aids and is engaging. Wife at bedside. They are discussing concerns with the menu and needing information. He does not like nectar thick consistency and discussions had of what he may tolerate. Call received from hospital personal by family during visit. Meal tray to bedside and family focusing on his eating. Discussed with SLP fluids he may have. Nepro provided by SLP which patient enjoyed.  Will return tomorrow.    Length of Stay: 4  Current Medications: Scheduled Meds:  . amLODipine  5 mg Oral Daily  . aspirin EC  81 mg Oral Daily  . atorvastatin  10 mg Oral QHS  . Chlorhexidine Gluconate Cloth  6 each Topical Daily  . enoxaparin (LOVENOX) injection  40 mg Subcutaneous Q24H  . multivitamin with minerals  1 tablet Oral Daily  . omega-3 acid ethyl esters  2 g Oral Daily  . senna  1 tablet Oral BID  . sodium chloride flush  3 mL Intravenous Q12H  . tamsulosin  0.4 mg Oral Daily    Continuous Infusions: . sodium chloride    . cefTRIAXone (ROCEPHIN)  IV 2 g (06/24/19 2100)  . metronidazole Stopped (06/25/19 0618)  . vancomycin 1,250 mg (06/24/19 1841)    PRN Meds: sodium chloride, acetaminophen **OR** acetaminophen, albuterol, ondansetron **OR** ondansetron (ZOFRAN) IV  Physical Exam Pulmonary:     Effort: Pulmonary effort is normal.  Neurological:     Mental Status: He is alert.             Vital Signs: BP (!) 147/68 (BP  Location: Left Arm)   Pulse 66   Temp 98.2 F (36.8 C)   Resp 18   Ht 5\' 11"  (1.803 m)   Wt 85.2 kg   SpO2 92%   BMI 26.20 kg/m  SpO2: SpO2: 92 % O2 Device: O2 Device: Nasal Cannula O2 Flow Rate: O2 Flow Rate (L/min): 4 L/min  Intake/output summary:   Intake/Output Summary (Last 24 hours) at 06/25/2019 1254 Last data filed  at 06/25/2019 1210 Gross per 24 hour  Intake 749 ml  Output 2050 ml  Net -1301 ml   LBM: Last BM Date: 06/25/19 Baseline Weight: Weight: 82 kg Most recent weight: Weight: 85.2 kg       Palliative Assessment/Data:      Patient Active Problem List   Diagnosis Date Noted  . Pressure injury of skin 06/25/2019  . Ureteral calculus   . Hydronephrosis with urinary obstruction due to ureteral calculus 06/21/2019  . Acute metabolic encephalopathy [id number]  . ARF (acute renal failure) ([place]) 03/27/2018  . Kidney stone 02/19/2018  . UTI (urinary tract infection) 02/19/2018  . Sepsis ([place]) 02/01/2017  . HCAP (healthcare-associated pneumonia) 02/01/2017  . Acute on chronic renal failure ([place]) 02/01/2017  . CKD (chronic kidney disease) stage 3, GFR 30-59 ml/min 11/08/2016  . HTN (hypertension) 10/03/2016  . History of CVA (cerebrovascular accident) 10/03/2016  . Pure hypercholesterolemia 10/03/2016  . Other male erectile dysfunction 06/27/2016  . Aortic atherosclerosis ([place]) 03/01/2016  . Vaccine counseling 12/30/2015    Palliative Care Assessment & Plan    Recommendations/Plan:  Support offered to patient and family. Discussed need for [place] conversations.     Code Status:    Code Status Orders  (From admission, onward)         Start     Ordered   06/21/19 1010  Full code  Continuous     06/21/19 1013        Code Status History    Date Active Date Inactive Code Status Order ID Comments User Context   03/27/2018 2005 03/30/2018 2133 Full Code [id number]  Demetrios Loll, MD ED   02/20/2018 0104 02/22/2018 1428 Full Code [id number]  Lance Coon,  MD Inpatient   02/01/2017 2317 02/03/2017 2117 Full Code [id number]  Lance Coon, MD ED   Advance Care Planning Activity    Advance Directive Documentation     Most Recent Value  Type of Advance Directive  Living will  Pre-existing out of facility DNR order (yellow form or pink MOST form)  --  "MOST" Form in Place?  --       Prognosis:   Unable to determine  Discharge Planning:  To Be Determined  Care plan was discussed with RN, SLP  Thank you for allowing the Palliative Medicine Team to assist in the care of this patient.   Time In: 12:15 Time Out: 1:15 Total Time 60 Prolonged Time Billed  no       Greater than 50%  of this time was spent counseling and coordinating care related to the above assessment and plan.  Asencion Gowda, NP  Please contact Palliative Medicine Team phone at [phone] for questions and concerns.

## 2019-06-26 ENCOUNTER — Inpatient Hospital Stay: Payer: Medicare PPO

## 2019-06-26 DIAGNOSIS — I10 Essential (primary) hypertension: Secondary | ICD-10-CM

## 2019-06-26 DIAGNOSIS — G9341 Metabolic encephalopathy: Secondary | ICD-10-CM

## 2019-06-26 LAB — CBC WITH DIFFERENTIAL/PLATELET
Abs Immature Granulocytes: 0.7 10*3/uL — ABNORMAL HIGH (ref 0.00–0.07)
Basophils Absolute: 0 10*3/uL (ref 0.0–0.1)
Basophils Relative: 0 %
Eosinophils Absolute: 0.4 10*3/uL (ref 0.0–0.5)
Eosinophils Relative: 4 %
HCT: 33.6 % — ABNORMAL LOW (ref 39.0–52.0)
Hemoglobin: 11.2 g/dL — ABNORMAL LOW (ref 13.0–17.0)
Immature Granulocytes: 7 %
Lymphocytes Relative: 12 %
Lymphs Abs: 1.2 10*3/uL (ref 0.7–4.0)
MCH: 32 pg (ref 26.0–34.0)
MCHC: 33.3 g/dL (ref 30.0–36.0)
MCV: 96 fL (ref 80.0–100.0)
Monocytes Absolute: 0.9 10*3/uL (ref 0.1–1.0)
Monocytes Relative: 9 %
Neutro Abs: 7.2 10*3/uL (ref 1.7–7.7)
Neutrophils Relative %: 68 %
Platelets: 129 10*3/uL — ABNORMAL LOW (ref 150–400)
RBC: 3.5 MIL/uL — ABNORMAL LOW (ref 4.22–5.81)
RDW: 14 % (ref 11.5–15.5)
WBC: 10.5 10*3/uL (ref 4.0–10.5)
nRBC: 0 % (ref 0.0–0.2)

## 2019-06-26 LAB — BASIC METABOLIC PANEL
Anion gap: 10 (ref 5–15)
BUN: 34 mg/dL — ABNORMAL HIGH (ref 8–23)
CO2: 25 mmol/L (ref 22–32)
Calcium: 8.7 mg/dL — ABNORMAL LOW (ref 8.9–10.3)
Chloride: 110 mmol/L (ref 98–111)
Creatinine, Ser: 0.96 mg/dL (ref 0.61–1.24)
GFR calc Af Amer: >60 mL/min (ref >60)
GFR calc non Af Amer: >60 mL/min (ref >60)
Glucose, Bld: 111 mg/dL — ABNORMAL HIGH (ref 70–99)
Potassium: 3.4 mmol/L — ABNORMAL LOW (ref 3.5–5.1)
Sodium: 145 mmol/L (ref 135–145)

## 2019-06-26 LAB — MAGNESIUM: Magnesium: 2 mg/dL (ref 1.7–2.4)

## 2019-06-26 LAB — BLOOD GAS, ARTERIAL
Acid-Base Excess: 5.4 mmol/L — ABNORMAL HIGH (ref 0.0–2.0)
Bicarbonate: 29 mmol/L — ABNORMAL HIGH (ref 20.0–28.0)
FIO2: 1
O2 Saturation: 98.7 %
Patient temperature: 37
pCO2 arterial: 38 mmHg (ref 32.0–48.0)
pH, Arterial: 7.49 — ABNORMAL HIGH (ref 7.350–7.450)
pO2, Arterial: 113 mmHg — ABNORMAL HIGH (ref 83.0–108.0)

## 2019-06-26 MED ORDER — NEPRO/CARBSTEADY PO LIQD
237.0000 mL | Freq: Three times a day (TID) | ORAL | Status: DC
  Administered 2019-06-27 – 2019-06-28 (×4): 237 mL via ORAL

## 2019-06-26 MED ORDER — POTASSIUM CHLORIDE CRYS ER 20 MEQ PO TBCR
40.0000 meq | EXTENDED_RELEASE_TABLET | Freq: Once | ORAL | Status: AC
  Administered 2019-06-26: 40 meq via ORAL
  Filled 2019-06-26: qty 2

## 2019-06-26 NOTE — Progress Notes (Signed)
Daily Progress Note   Patient Name: Robert [name]       Date: 06/26/2019 DOB: April 19, 1920  Age: 86 y.o. MRN#: [id number] Attending Physician: Ezekiel Slocumb, DO Primary Care Physician: Dion Body, MD Admit Date: 06/21/2019  Reason for Consultation/Follow-up: Establishing goals of care  Subjective: Patient is sitting up in bedside chair. Wife is at bedside. He is eating lunch. He coughs with drinking Nepro. Wife is focusing on assisting him.   Broached GOC. She states they have discussed this in great detail and have papers completed and in the database. Offered a MOST form. They would like to complete a MOST form and the form was completed by wife and patient. The decisions marked were discussed from a clinical standpoint and patient and spouse verbalize understanding.    I completed a MOST form today and the signed original was placed in the chart. A photocopy was placed in the chart to be scanned into EMR. The patient outlined their wishes for the following treatment decisions, and wife completed the form for:  Cardiopulmonary Resuscitation: Attempt Resuscitation (CPR)  Medical Interventions: Limited Additional Interventions: Use medical treatment, IV fluids and cardiac monitoring as indicated, DO NOT USE intubation or mechanical ventilation. May consider use of less invasive airway support such as BiPAP or CPAP. Also provide comfort measures. Transfer to the hospital if indicated. Avoid intensive care.   Antibiotics: Antibiotics if indicated  IV Fluids: IV fluids for a defined trial period  Feeding Tube: Feeding tube for a defined trial period       Length of Stay: 5  Current Medications: Scheduled Meds:  . amLODipine  5 mg Oral Daily  . aspirin EC  81 mg Oral Daily  .  atorvastatin  10 mg Oral QHS  . Chlorhexidine Gluconate Cloth  6 each Topical Daily  . enoxaparin (LOVENOX) injection  40 mg Subcutaneous Q24H  . multivitamin with minerals  1 tablet Oral Daily  . omega-3 acid ethyl esters  2 g Oral Daily  . potassium chloride  40 mEq Oral Once  . senna  1 tablet Oral BID  . sodium chloride flush  3 mL Intravenous Q12H  . tamsulosin  0.4 mg Oral Daily    Continuous Infusions: . sodium chloride    . cefTRIAXone (ROCEPHIN)  IV 2 g (06/25/19 2019)  .  metronidazole 500 mg (06/26/19 0526)    PRN Meds: sodium chloride, acetaminophen **OR** acetaminophen, albuterol, ondansetron **OR** ondansetron (ZOFRAN) IV  Physical Exam Pulmonary:     Effort: Pulmonary effort is normal.  Neurological:     Mental Status: He is alert.             Vital Signs: BP 125/66 (BP Location: Left Arm)   Pulse 70   Temp 97.6 F (36.4 C) (Oral)   Resp 19   Ht 5\' 11"  (1.803 m)   Wt 85.2 kg   SpO2 93%   BMI 26.20 kg/m  SpO2: SpO2: 93 % O2 Device: O2 Device: Nasal Cannula O2 Flow Rate: O2 Flow Rate (L/min): 4 L/min  Intake/output summary:   Intake/Output Summary (Last 24 hours) at 06/26/2019 1346 Last data filed at 06/26/2019 1005 Gross per 24 hour  Intake 1318 ml  Output 800 ml  Net 518 ml   LBM: Last BM Date: 06/25/19 Baseline Weight: Weight: 82 kg Most recent weight: Weight: 85.2 kg       Palliative Assessment/Data:      Patient Active Problem List   Diagnosis Date Noted  . Pressure injury of skin 06/25/2019  . Ureteral calculus   . Hydronephrosis with urinary obstruction due to ureteral calculus 06/21/2019  . Acute metabolic encephalopathy [id number]  . ARF (acute renal failure) ([place]) 03/27/2018  . Kidney stone 02/19/2018  . UTI (urinary tract infection) 02/19/2018  . Sepsis ([place]) 02/01/2017  . HCAP (healthcare-associated pneumonia) 02/01/2017  . Acute on chronic renal failure ([place]) 02/01/2017  . CKD (chronic kidney disease) stage 3, GFR 30-59  ml/min 11/08/2016  . HTN (hypertension) 10/03/2016  . History of CVA (cerebrovascular accident) 10/03/2016  . Pure hypercholesterolemia 10/03/2016  . Other male erectile dysfunction 06/27/2016  . Aortic atherosclerosis ([place]) 03/01/2016  . Vaccine counseling 12/30/2015    Palliative Care Assessment & Plan    Recommendations/Plan:  MOST form completed.   Recommend palliative outpatient if patient/family are in agreement.     Code Status:    Code Status Orders  (From admission, onward)         Start     Ordered   06/21/19 1010  Full code  Continuous     06/21/19 1013        Code Status History    Date Active Date Inactive Code Status Order ID Comments User Context   03/27/2018 2005 03/30/2018 2133 Full Code [id number]  Demetrios Loll, MD ED   02/20/2018 0104 02/22/2018 1428 Full Code [id number]  Lance Coon, MD Inpatient   02/01/2017 2317 02/03/2017 2117 Full Code [id number]  Lance Coon, MD ED   Advance Care Planning Activity    Advance Directive Documentation     Most Recent Value  Type of Advance Directive  Living will  Pre-existing out of facility DNR order (yellow form or pink MOST form)  --  "MOST" Form in Place?  --       Prognosis:   Unable to determine  Discharge Planning:  To Be Determined   Thank you for allowing the Palliative Medicine Team to assist in the care of this patient.   Time In: 1:00 Time Out: 1:58 Total Time 58 min Prolonged Time Billed  no       Greater than 50%  of this time was spent counseling and coordinating care related to the above assessment and plan.  Asencion Gowda, NP  Please contact Palliative Medicine Team phone at [phone] for questions and concerns.

## 2019-06-26 NOTE — Progress Notes (Addendum)
Pt currently on venti mask with oxygen saturation of 98%. Patient asleep without any signs of distress at this time. Will continue to monitor.   Update [phone]: Patient awake, alert and oriented x3 now. Repositioned and sat up for breakfast. Venti Mask taken off and Nasal Cannula put on 4L, which is what he was on previously. Patient maintaining oxygen saturation at 94-96% while eating, no complaints of shortness of breath. Patient eating breakfast.

## 2019-06-26 NOTE — Progress Notes (Signed)
Pt noted to have some confusion during the night. He attempted to get OOB and stated "his wife was on the way to pick him up". I Called pt's wife per his request. Informed wife of pt's condition and she quickly reoriented him. Upon re-assessment of his VS I noticed the O2 saturation was high 80's to low 90s. Increased O2 from 3L to 4.5LNC. NP notified and orders for repeat imaging in the AM. NP does not want to give meds to patient for insomnia due to increase in O2 requirement.

## 2019-06-26 NOTE — Progress Notes (Signed)
PROGRESS NOTE    Robert [name]  YIR:[id number] DOB: 03-07-1921 DOA: 06/21/2019  PCP: Dion Body, MD    LOS - 5   Brief Narrative:  86 year old male with history of CVA, TIAs, hypertension and nephrolithiasis presented with acute change in his mental status of 1 day duration.  Patient having poor p.o. intake and nausea with low back pain, subsequently developed chills but no fever. In the ED he has significant leukocytosis with WBC of 20 K, UA Sater UTI, elevated lactic acid of 2.9.  Patient met criteria for sepsis.  CT scan with renal study showed 4 mm calculus at this into the left UPJ with mild proximal left hydroureteronephrosis. Patient given IV fluids.  Sepsis pathway initiated.  Empiric antibiotic started and urology consulted. Patient taken to the OR for cystoscopy with retrograde pyelogram and stent placement.  Hospital course prolonged due to development of aspiration pneumonia and acute respiratory failure.  Subjective 3/10: Patient seen while eating breakfast this morning.  Wife is not at bedside at that time but that was her later in the afternoon and answered all her questions.  Patient did have some confusion overnight, was reportedly tried to get out of bed and was disoriented.  Nursing called his wife and she was able to reorient him over the phone.  Patient denies any acute complaints today, states he just wants to go home.  Assessment & Plan:   Principal Problem:   Sepsis ([place]) Active Problems:   HTN (hypertension)   UTI (urinary tract infection)   Hydronephrosis with urinary obstruction due to ureteral calculus   Acute metabolic encephalopathy   Ureteral calculus   Pressure injury of skin   Severe sepsis  Sepsis has resolved.  Secondary to E. coli UTI with bacteremia. --Continue Rocephin and Flagyl for bacteremia and aspiration pneumonia. --Plan to discharge on omnicef +/- flagyl to cover for both aspiration and ecoli bacteremia  --will Omnicef to  cover 2 weeks abx for bacteremia  Acute respiratory failure with hypoxia  Aspiration pneumonia into the right upper lung Went into respiratory distress on 3/7 requiring 6 L nasal cannula.  Chest x-ray showing right upper lobe infiltrate.  SLP following and diet change to dysphagia level 3 with nectar thickened liquids.   --aspiration precautions --antibiotics as above  Hydronephrosis with urinary obstruction due to ureteral calculus S/p retrograde pyelogram and left ureteral stent placement.  Foley removed and voiding without difficulty.  Acute kidney injury Likely combination of ATN with sepsis and postobstructive.  Resolved with IV hydration.  Acute pulmonary edema Secondary to volume overload with IV hydration.  Intermittent IV Lasix.  Acute metabolic encephalopathy Secondary to sepsis.  Resolved.  Essential hypertension Continue amlodipine.  History of CVA Continue aspirin and statin.  Thrombocytopenia Mild, possibly due to sepsis.  Monitor.  Hypokalemia Replenished    DVT prophylaxis: Lovenox   Code Status: Full Code  Family Communication: wife at bedside, updated this afternoon and all questions answered   Disposition Plan:  To SNF pending improvement in hypoxia. Coming From Home Exp DC Date 3/12-13 Barriers ongoing hypoxia, bacteremia, aspirating Medically Stable for Discharge? No   Consultants:   Urology  Procedures:  retrograde pyelogram and left ureteral stent placement  Antimicrobials:   Rocephin  Flagyl    Objective: Vitals:   06/26/19 0640 06/26/19 0817 06/26/19 0818 06/26/19 0837  BP:   125/82   Pulse: 68  66   Resp:   19   Temp:    98 F (36.7 C)  TempSrc:    Oral  SpO2: 95% 95%  93%  Weight:      Height:        Intake/Output Summary (Last 24 hours) at 06/26/2019 0845 Last data filed at 06/26/2019 0762 Gross per 24 hour  Intake 1641 ml  Output 1250 ml  Net 391 ml   Filed Weights   06/22/19 0618 06/23/19 0524  06/25/19 0337  Weight: 89.1 kg 89.1 kg 85.2 kg    Examination:  General exam: awake, alert, no acute distress HEENT: atraumatic, clear conjunctiva, anicteric sclera, moist mucus membranes, hearing grossly normal  Respiratory system: mostly clear to auscultation on exam limited by patient's voice, no wheezes, rales or rhonchi, normal respiratory effort on 6L/min nasal cannula. Cardiovascular system: normal S1/S2, RRR, no pedal edema.   Gastrointestinal system: soft, NT, ND, no HSM felt, +bowel sounds. Central nervous system: A&O x3. no gross focal neurologic deficits, normal speech Extremities: moves all, no edema, normal tone Skin: dry, intact, normal temperature, normal color Psychiatry: normal mood, congruent affect abnormal judgement and insight     Data Reviewed: I have personally reviewed following labs and imaging studies  CBC: Recent Labs  Lab 06/21/19 0705 06/22/19 0529 06/23/19 0508 06/25/19 0515 06/26/19 0717  WBC 21.3* 8.8 8.9 10.9* 10.5  NEUTROABS 20.0*  --   --   --  7.2  HGB 12.6* 12.0* 11.6* 12.2* 11.2*  HCT 38.1* 36.0* 36.0* 35.7* 33.6*  MCV 96.5 98.6 98.6 94.7 96.0  PLT 135* 102* 93* 111* 263*   Basic Metabolic Panel: Recent Labs  Lab 06/21/19 0705 06/22/19 0529 06/23/19 0508 06/25/19 0515 06/26/19 0717  NA 142 143 142 144 145  K 4.2 4.1 3.9 3.4* 3.4*  CL 112* 114* 112* 110 110  CO2 20* 22 20* 28 25  GLUCOSE 101* 94 95 112* 111*  BUN 28* 27* 23 28* 34*  CREATININE 1.54* 1.37* 1.10 0.99 0.96  CALCIUM 9.0 7.8* 8.2* 8.8* 8.7*  MG  --   --   --   --  2.0   GFR: Estimated Creatinine Clearance: 47.9 mL/min (by C-G formula based on SCr of 0.96 mg/dL). Liver Function Tests: Recent Labs  Lab 06/21/19 0705  AST 38  ALT 16  ALKPHOS 57  BILITOT 1.3*  PROT 6.3*  ALBUMIN 3.6   No results for input(s): LIPASE, AMYLASE in the last 168 hours. No results for input(s): AMMONIA in the last 168 hours. Coagulation Profile: Recent Labs  Lab  06/22/19 0529  INR 1.2   Cardiac Enzymes: No results for input(s): CKTOTAL, CKMB, CKMBINDEX, TROPONINI in the last 168 hours. BNP (last 3 results) No results for input(s): PROBNP in the last 8760 hours. HbA1C: No results for input(s): HGBA1C in the last 72 hours. CBG: No results for input(s): GLUCAP in the last 168 hours. Lipid Profile: No results for input(s): CHOL, HDL, LDLCALC, TRIG, CHOLHDL, LDLDIRECT in the last 72 hours. Thyroid Function Tests: No results for input(s): TSH, T4TOTAL, FREET4, T3FREE, THYROIDAB in the last 72 hours. Anemia Panel: No results for input(s): VITAMINB12, FOLATE, FERRITIN, TIBC, IRON, RETICCTPCT in the last 72 hours. Sepsis Labs: Recent Labs  Lab 06/21/19 0705 06/21/19 1008 06/22/19 0529  PROCALCITON 38.90  --  27.72  LATICACIDVEN 2.9* 2.4*  --     Recent Results (from the past 240 hour(s))  Blood Culture (routine x 2)     Status: Abnormal   Collection Time: 06/21/19  7:05 AM   Specimen: BLOOD  Result Value Ref Range Status  Specimen Description   Final    BLOOD L FARM Performed at [facility], [address] [place], [place] [address]    Special Requests   Final    BOTTLES DRAWN AEROBIC AND ANAEROBIC Blood Culture adequate volume Performed at [facility], [address] [place], [place] [address]    Culture  Setup Time   Final    IN BOTH AEROBIC AND ANAEROBIC BOTTLES GRAM NEGATIVE RODS CRITICAL RESULT CALLED TO, READ BACK BY AND VERIFIED WITH: LISA KLUTTZ ON 06/21/19 AT 2006 [facility] Performed at [facility], [place] [address], [place], [place] [address]    Culture ESCHERICHIA COLI (A)  Final   Report Status 06/24/2019 FINAL  Final   Organism ID, Bacteria ESCHERICHIA COLI  Final      Susceptibility   Escherichia coli - MIC*    AMPICILLIN >=32 RESISTANT Resistant     CEFAZOLIN <=4 SENSITIVE Sensitive     CEFEPIME <=0.12 SENSITIVE Sensitive     CEFTAZIDIME <=1 SENSITIVE Sensitive     CEFTRIAXONE <=0.25  SENSITIVE Sensitive     CIPROFLOXACIN >=4 RESISTANT Resistant     GENTAMICIN <=1 SENSITIVE Sensitive     IMIPENEM <=0.25 SENSITIVE Sensitive     TRIMETH/SULFA >=320 RESISTANT Resistant     AMPICILLIN/SULBACTAM >=32 RESISTANT Resistant     PIP/TAZO <=4 SENSITIVE Sensitive     * ESCHERICHIA COLI  Blood Culture (routine x 2)     Status: Abnormal   Collection Time: 06/21/19  7:05 AM   Specimen: BLOOD  Result Value Ref Range Status   Specimen Description   Final    BLOOD R AC Performed at [facility], [address], [place], [place] [address]    Special Requests   Final    BOTTLES DRAWN AEROBIC AND ANAEROBIC Blood Culture adequate volume Performed at [facility], [address] [place], [place] [address]    Culture  Setup Time   Final    IN BOTH AEROBIC AND ANAEROBIC BOTTLES GRAM NEGATIVE RODS CRITICAL VALUE NOTED.  VALUE IS CONSISTENT WITH PREVIOUSLY REPORTED AND CALLED VALUE. Performed at [facility], [address] [place], [place] [address]    Culture (A)  Final    ESCHERICHIA COLI SUSCEPTIBILITIES PERFORMED ON PREVIOUS CULTURE WITHIN THE LAST 5 DAYS. Performed at [facility], [place] [address] [place], [place] [address]    Report Status 06/24/2019 FINAL  Final  Urine culture     Status: Abnormal   Collection Time: 06/21/19  7:05 AM   Specimen: In/Out Cath Urine  Result Value Ref Range Status   Specimen Description   Final    IN/OUT CATH URINE Performed at [facility], [address] [place], [place] [address]    Special Requests   Final    NONE Performed at [facility], [address] [place], [place] [address]    Culture >=100,000 COLONIES/mL ESCHERICHIA COLI (A)  Final   Report Status 06/23/2019 FINAL  Final   Organism ID, Bacteria ESCHERICHIA COLI (A)  Final      Susceptibility   Escherichia coli - MIC*    AMPICILLIN >=32 RESISTANT Resistant     CEFAZOLIN <=4 SENSITIVE Sensitive     CEFTRIAXONE  <=0.25 SENSITIVE Sensitive     CIPROFLOXACIN >=4 RESISTANT Resistant     GENTAMICIN <=1 SENSITIVE Sensitive     IMIPENEM <=0.25 SENSITIVE Sensitive     NITROFURANTOIN <=16 SENSITIVE Sensitive     TRIMETH/SULFA >=320 RESISTANT  Resistant     AMPICILLIN/SULBACTAM 16 INTERMEDIATE Intermediate     PIP/TAZO <=4 SENSITIVE Sensitive     * >=100,000 COLONIES/mL ESCHERICHIA COLI  Blood Culture ID Panel (Reflexed)     Status: Abnormal   Collection Time: 06/21/19  7:05 AM  Result Value Ref Range Status   Enterococcus species NOT DETECTED NOT DETECTED Final   Listeria monocytogenes NOT DETECTED NOT DETECTED Final   Staphylococcus species NOT DETECTED NOT DETECTED Final   Staphylococcus aureus (BCID) NOT DETECTED NOT DETECTED Final   Streptococcus species NOT DETECTED NOT DETECTED Final   Streptococcus agalactiae NOT DETECTED NOT DETECTED Final   Streptococcus pneumoniae NOT DETECTED NOT DETECTED Final   Streptococcus pyogenes NOT DETECTED NOT DETECTED Final   Acinetobacter baumannii NOT DETECTED NOT DETECTED Final   Enterobacteriaceae species DETECTED (A) NOT DETECTED Final    Comment: Enterobacteriaceae represent a large family of gram-negative bacteria, not a single organism. CRITICAL RESULT CALLED TO, READ BACK BY AND VERIFIED WITH: LISA KLUTTZ ON 06/21/19 AT 2006 [facility]    Enterobacter cloacae complex NOT DETECTED NOT DETECTED Final   Escherichia coli DETECTED (A) NOT DETECTED Final    Comment: CRITICAL RESULT CALLED TO, READ BACK BY AND VERIFIED WITH: LISA KLUTTZ ON 06/21/19 AT 2006 [facility]    Klebsiella oxytoca NOT DETECTED NOT DETECTED Final   Klebsiella pneumoniae NOT DETECTED NOT DETECTED Final   Proteus species NOT DETECTED NOT DETECTED Final   Serratia marcescens NOT DETECTED NOT DETECTED Final   Carbapenem resistance NOT DETECTED NOT DETECTED Final   Haemophilus influenzae NOT DETECTED NOT DETECTED Final   Neisseria meningitidis NOT DETECTED NOT DETECTED Final   Pseudomonas aeruginosa NOT  DETECTED NOT DETECTED Final   Candida albicans NOT DETECTED NOT DETECTED Final   Candida glabrata NOT DETECTED NOT DETECTED Final   Candida krusei NOT DETECTED NOT DETECTED Final   Candida parapsilosis NOT DETECTED NOT DETECTED Final   Candida tropicalis NOT DETECTED NOT DETECTED Final    Comment: Performed at [facility], [address] [place], [place] [address]  Respiratory Panel by RT PCR (Flu A&B, Covid) - Nasopharyngeal Swab     Status: None   Collection Time: 06/21/19  9:44 AM   Specimen: Nasopharyngeal Swab  Result Value Ref Range Status   SARS Coronavirus 2 by RT PCR NEGATIVE NEGATIVE Final    Comment: (NOTE) SARS-CoV-2 target nucleic acids are NOT DETECTED. The SARS-CoV-2 RNA is generally detectable in upper respiratoy specimens during the acute phase of infection. The lowest concentration of SARS-CoV-2 viral copies this assay can detect is 131 copies/mL. A negative result does not preclude SARS-Cov-2 infection and should not be used as the sole basis for treatment or other patient management decisions. A negative result may occur with  improper specimen collection/handling, submission of specimen other than nasopharyngeal swab, presence of viral mutation(s) within the areas targeted by this assay, and inadequate number of viral copies (<131 copies/mL). A negative result must be combined with clinical observations, patient history, and epidemiological information. The expected result is Negative. Fact Sheet for Patients:  PinkCheek.be Fact Sheet for Healthcare Providers:  GravelBags.it This test is not yet ap proved or cleared by the [place] FDA and  has been authorized for detection and/or diagnosis of SARS-CoV-2 by FDA under an Emergency Use Authorization (EUA). This EUA will remain  in effect (meaning this test can be used) for the duration of the COVID-19 declaration under Section 564(b)(1) of  the Act, 21 U.S.C. section 360bbb-3(b)(1), unless the  authorization is terminated or revoked sooner.    Influenza A by PCR NEGATIVE NEGATIVE Final   Influenza B by PCR NEGATIVE NEGATIVE Final    Comment: (NOTE) The Xpert Xpress SARS-CoV-2/FLU/RSV assay is intended as an aid in  the diagnosis of influenza from Nasopharyngeal swab specimens and  should not be used as a sole basis for treatment. Nasal washings and  aspirates are unacceptable for Xpert Xpress SARS-CoV-2/FLU/RSV  testing. Fact Sheet for Patients: PinkCheek.be Fact Sheet for Healthcare Providers: GravelBags.it This test is not yet approved or cleared by the [place] FDA and  has been authorized for detection and/or diagnosis of SARS-CoV-2 by  FDA under an Emergency Use Authorization (EUA). This EUA will remain  in effect (meaning this test can be used) for the duration of the  Covid-19 declaration under Section 564(b)(1) of the Act, 21  U.S.C. section 360bbb-3(b)(1), unless the authorization is  terminated or revoked. Performed at [facility], [address] [place], [place] [address]   MRSA PCR Screening     Status: None   Collection Time: 06/24/19  7:59 AM   Specimen: Nasal Mucosa; Nasopharyngeal  Result Value Ref Range Status   MRSA by PCR NEGATIVE NEGATIVE Final    Comment:        The GeneXpert MRSA Assay (FDA approved for NASAL specimens only), is one component of a comprehensive MRSA colonization surveillance program. It is not intended to diagnose MRSA infection nor to guide or monitor treatment for MRSA infections. Performed at [facility], [address] [place], [place] [address]          Radiology Studies: DG Chest 1 View  Result Date: 06/26/2019 CLINICAL DATA:  Worsening hypoxia.  Aspiration pneumonia EXAM: CHEST  1 VIEW COMPARISON:  Three days ago FINDINGS: Airspace opacity asymmetric to the right. Allowing  for differences in rotation there is no convincing change in the overall pattern or extent. No new Kerley lines or pleural fluid. Cardiomegaly is stable. No evidence of pneumothorax. IMPRESSION: Stable pneumonia pattern. Electronically Signed   By: Monte Fantasia M.D.   On: 06/26/2019 06:40        Scheduled Meds: . amLODipine  5 mg Oral Daily  . aspirin EC  81 mg Oral Daily  . atorvastatin  10 mg Oral QHS  . Chlorhexidine Gluconate Cloth  6 each Topical Daily  . enoxaparin (LOVENOX) injection  40 mg Subcutaneous Q24H  . multivitamin with minerals  1 tablet Oral Daily  . omega-3 acid ethyl esters  2 g Oral Daily  . senna  1 tablet Oral BID  . sodium chloride flush  3 mL Intravenous Q12H  . tamsulosin  0.4 mg Oral Daily   Continuous Infusions: . sodium chloride    . cefTRIAXone (ROCEPHIN)  IV 2 g (06/25/19 2019)  . metronidazole 500 mg (06/26/19 0526)     LOS: 5 days    Time spent: 50 minutes    Ezekiel Slocumb, DO [facility]   If 7PM-7AM, please contact night-coverage [email or web address] 06/26/2019, 8:45 AM

## 2019-06-26 NOTE — Progress Notes (Signed)
Physical Therapy Treatment Patient Details Name: Robert [name] MRN: [id number] DOB: 1921-01-13 Today's Date: 06/26/2019    History of Present Illness Pt is a 86 y.o. 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  Patient was brought in this morning for evaluation of mental status changes which the wife note prior to his admission.  Pt is now s/p cystocopy with L ureteral stent placement (06/21/2019).    PT Comments    Pt supine in bed with head of bed elevated upon Therapist arrival. He reports that he had a rough night and wife reports she received a call at 2:30 in the morning from nursing about need to increase O2. No pain reported. Pt used Therapist hand to pull himself up from supine to sit. Use of B UE and SBA required to sit edge of bed safely. Lateral lean to the R and verbal cuing required from Therapist to find neutral. Therapist donned gait belt in sitting. Transferred from sitting to standing using B UE to push up from the bed and MinA from Therapist. Rolling walker and MinA required in standing for support. Transferred 2 feet to R to standing in front of relciner with MinA. Pt controlled descent into recliner with B UE and MinA from Therapist. Pt on 4L O2 nasal cannula with SpO2 resting at 88%. O2 increased to 6L for gait training. Pt ambulated 30 feet with use of rolling walker and MinA from Therapist on 6L of O2 with SpO2 maintained at 90%. Pt educated on benefits on discharge to SNF to improve strength and endurance. Both Pt and spouse in agreement.  Pt left in recliner at end of session with chair alarm set and call bell in reach. Pt. Returned to 4L O2.   Follow Up Recommendations  SNF     Equipment Recommendations  Rolling walker with 5" wheels    Recommendations for Other Services       Precautions / Restrictions Restrictions Weight Bearing Restrictions: No    Mobility  Bed  Mobility Overal bed mobility: Needs Assistance Bed Mobility: Supine to Sit     Supine to sit: Min assist     General bed mobility comments: Pt. used Therapist hand to pull himseful up from supine to sit.   Transfers Overall transfer level: Needs assistance Equipment used: Rolling walker (2 wheeled)   Sit to Stand: Min assist         General transfer comment: Pt. used bed to push up from seated to standing position. Upon standing, Pt. used rolling walker for support. Patient turned 2 feet to R to infront of recliner. Patient reached arms posterior to arm rests to aide with descent into recliner.   Ambulation/Gait Ambulation/Gait assistance: Min assist Gait Distance (Feet): 30 Feet Assistive device: Rolling walker (2 wheeled) Gait Pattern/deviations: Decreased step length - right;Decreased step length - left     General Gait Details: Pt. limited by fatigue and SOB during ambulation   Stairs             Wheelchair Mobility    Modified Rankin (Stroke Patients Only)       Balance Overall balance assessment: Needs assistance Sitting-balance support: Feet supported;Bilateral upper extremity supported Sitting balance-Leahy Scale: Fair Sitting balance - Comments: Pt. leaned to R and required verbal cuing to find neutral. Pt. required use of B UE for support and SBA from Therapist for safety.  Postural control: Right lateral lean Standing balance support:  Bilateral upper extremity supported Standing balance-Leahy Scale: Fair Standing balance comment: Patient required use of rolling walker and MinA from Therapist.                             Cognition Arousal/Alertness: Awake/alert                                     General Comments: Patient polite.  Oriented to name.  Oriented that he was in hospital. Pt. cognition improved from previous night per wife.       Exercises      General Comments        Pertinent Vitals/Pain Pain  Assessment: No/denies pain Faces Pain Scale: No hurt    Home Living                      Prior Function            PT Goals (current goals can now be found in the care plan section)      Frequency    Min 2X/week      PT Plan Current plan remains appropriate    Co-evaluation              AM-PAC PT "6 Clicks" Mobility   Outcome Measure  Help needed turning from your back to your side while in a flat bed without using bedrails?: A Little Help needed moving from lying on your back to sitting on the side of a flat bed without using bedrails?: A Little Help needed moving to and from a bed to a chair (including a wheelchair)?: A Little Help needed standing up from a chair using your arms (e.g., wheelchair or bedside chair)?: A Little Help needed to walk in hospital room?: A Lot Help needed climbing 3-5 steps with a railing? : Total 6 Click Score: 15    End of Session Equipment Utilized During Treatment: Gait belt;Oxygen Activity Tolerance: Patient limited by fatigue Patient left: in chair;with call bell/phone within reach;with chair alarm set;with family/visitor present Nurse Communication: Other (comment)(Therapist communicated distance Pt. ambualated) PT Visit Diagnosis: Other abnormalities of gait and mobility (R26.89);Muscle weakness (generalized) (M62.81);Difficulty in walking, not elsewhere classified (R26.2)     Time: [id number] PT Time Calculation (min) (ACUTE ONLY): 25 min  Charges:  $Gait Training: 8-22 mins $Therapeutic Activity: 8-22 mins                    Julaine Fusi PTA 06/26/19, 12:10 PM

## 2019-06-26 NOTE — Progress Notes (Signed)
No note unable to cancel

## 2019-06-26 NOTE — Plan of Care (Signed)

## 2019-06-26 NOTE — Progress Notes (Signed)
Pt placed on NRB mask. O2 sats WDL, no s/s of distress. NP notified. Orders for ABG x 1. RT notified

## 2019-06-27 DIAGNOSIS — R7881 Bacteremia: Secondary | ICD-10-CM | POA: Diagnosis present

## 2019-06-27 DIAGNOSIS — B9620 Unspecified Escherichia coli [E. coli] as the cause of diseases classified elsewhere: Secondary | ICD-10-CM | POA: Diagnosis present

## 2019-06-27 LAB — BASIC METABOLIC PANEL
Anion gap: 6 (ref 5–15)
BUN: 30 mg/dL — ABNORMAL HIGH (ref 8–23)
CO2: 28 mmol/L (ref 22–32)
Calcium: 9 mg/dL (ref 8.9–10.3)
Chloride: 113 mmol/L — ABNORMAL HIGH (ref 98–111)
Creatinine, Ser: 0.89 mg/dL (ref 0.61–1.24)
GFR calc Af Amer: >60 mL/min (ref >60)
GFR calc non Af Amer: >60 mL/min (ref >60)
Glucose, Bld: 115 mg/dL — ABNORMAL HIGH (ref 70–99)
Potassium: 4.2 mmol/L (ref 3.5–5.1)
Sodium: 147 mmol/L — ABNORMAL HIGH (ref 135–145)

## 2019-06-27 LAB — MAGNESIUM: Magnesium: 2.1 mg/dL (ref 1.7–2.4)

## 2019-06-27 MED ORDER — DEXTROSE 5 % IV SOLN: INTRAVENOUS | Status: DC

## 2019-06-27 NOTE — Progress Notes (Addendum)
PROGRESS NOTE    Robert [name]  EQA:[id number] DOB: 1920-11-20 DOA: 06/21/2019  PCP: Dion Body, MD    LOS - 6   Brief Narrative:  86 year old male with history of CVA, TIAs, hypertension and nephrolithiasis presented with acute change in his mental status of 1 day duration. Patient having poor p.o. intake and nausea with low back pain, subsequently developed chills but no fever. In the ED he has significant leukocytosis with WBC of 20 K, UA Sater UTI, elevated lactic acid of 2.9. Patient met criteria for sepsis. CT scan with renal study showed 4 mm calculus at this into the left UPJ with mild proximal left hydroureteronephrosis. Patient given IV fluids. Sepsis pathway initiated. Empiric antibiotic started and urology consulted. Patient taken to the OR for cystoscopy with retrograde pyelogram and stent placement.  Hospital course prolonged due to development of aspiration pneumonia and acute respiratory failure.  Subjective 3/11: Patient seen with wife at bedside this AM.  No acute events reported overnight.  Patient still on oxygen, discussed weaning down/off with patient and wife.  He does not use oxygen at baseline.  Patient denies pain, SOB, N/V/D or other acute complaints.  Wife reports he isn't eating very much at all, concerned he will get more weak.  Patient intermittently falls asleep during encounter.    Assessment & Plan:   Principal Problem:   Bacteremia due to Escherichia coli Active Problems:   Sepsis ([place])   UTI (urinary tract infection)   Hydronephrosis with urinary obstruction due to ureteral calculus   Acute metabolic encephalopathy   Ureteral calculus   HTN (hypertension)   Pressure injury of skin   Sepsis- resolved Secondary to E. coli UTI with bacteremia. --Continue Rocephin and Flagyl for bacteremia and aspiration pneumonia. --Plan to discharge on omnicef +/- flagyl to cover for both aspiration and E. coli bacteremia  --will transition to  [facility] on d/c to cover 2 weeks abx for bacteremia  --follow up repeat blood cx's  Acute respiratory failure with hypoxia  Aspiration pneumonia into the right upper lung Went into respiratory distress on 3/7 requiring 6 L nasal cannula. Chest x-ray showing right upper lobe infiltrate.  SLP following and diet change to dysphagia level3with nectar thickened liquids.  --aspiration precautions --antibiotics as above --wean off oxygen as tolerated, use supplemental O2 to keep sats >90%  Hypernatremia - Na 147 on 3/11, previously normal.  Due to patient not eating or drinking. --start gentle D5W today --repeat BMP in AM  Dysphagia - SLP following  Hydronephrosis with urinary obstruction due to ureteral calculus S/p retrograde pyelogram and left ureteral stent placement.Foley removed and voiding without difficulty.  Acute kidney injury Likely combination of ATN with sepsis and postobstructive.Resolved with IV hydration.  Acute pulmonary edema Secondary to volume overload with IV hydration. Intermittent IV Lasix.  Acute metabolic encephalopathy Secondary to sepsis. Resolved.  Essential hypertension Continue amlodipine.  History of CVA Continue aspirin and statin.  Thrombocytopenia - improving Mild, possibly due to sepsis. Monitor CBC.  Hypokalemia Replenished.  Monitor BMP.    DVT prophylaxis: Lovenox   Code Status: Partial Code  Family Communication: wife at bedside updated and questions answered   Disposition Plan:  Likely can d/c to SNF tomorrow, pending improvement in hypoxia.  Expect he will need oxygen short term. Coming From Home Exp DC Date 3/12 Barriers hypoxia, weaning down oxygen Medically Stable for Discharge? no   Consultants:   Urology  Procedures:   Retrograde pyelogram and left ureteral stent placement  Antimicrobials:   Rocephin  Flagyl    Objective: Vitals:   06/27/19 0809 06/27/19 1156 06/27/19 1457 06/27/19 1629   BP: (!) 152/81 (!) 138/56  135/63  Pulse: 66 65  71  Resp: _0 Temp: 97.9 F (36.6 C) 98.8 F (37.1 C)  97.8 F (36.6 C)  TempSrc: Oral Oral  Oral  SpO2: 94% 92% 91% 91%  Weight:      Height:        Intake/Output Summary (Last 24 hours) at 06/27/2019 1744 Last data filed at 06/27/2019 1519 Gross per 24 hour  Intake 1191.72 ml  Output 1320 ml  Net -128.28 ml   Filed Weights   06/23/19 0524 06/25/19 0337 06/27/19 0524  Weight: 89.1 kg 85.2 kg 83.9 kg    Examination:  General exam: awake, alert, no acute distress HEENT: dry mucus membranes, mild hearing impairment  Respiratory system: CTAB, no wheezes, rales or rhonchi, normal respiratory effort. Cardiovascular system: normal S1/S2, RRR, no pedal edema.   Gastrointestinal system: soft, NT, ND, no HSM felt, +bowel sounds. Central nervous system: A&O x3. no gross focal neurologic deficits, normal speech Extremities: moves all, no cyanosis, normal tone Skin: dry, intact, normal temperature, normal color Psychiatry: normal mood, congruent affect, judgement and insight appear normal    Data Reviewed: I have personally reviewed following labs and imaging studies  CBC: Recent Labs  Lab 06/21/19 0705 06/22/19 0529 06/23/19 0508 06/25/19 0515 06/26/19 0717  WBC 21.3* 8.8 8.9 10.9* 10.5  NEUTROABS 20.0*  --   --   --  7.2  HGB 12.6* 12.0* 11.6* 12.2* 11.2*  HCT 38.1* 36.0* 36.0* 35.7* 33.6*  MCV 96.5 98.6 98.6 94.7 96.0  PLT 135* 102* 93* 111* 482*   Basic Metabolic Panel: Recent Labs  Lab 06/22/19 0529 06/23/19 0508 06/25/19 0515 06/26/19 0717 06/27/19 0439  NA 143 142 144 145 147*  K 4.1 3.9 3.4* 3.4* 4.2  CL 114* 112* 110 110 113*  CO2 22 20* _1 GLUCOSE 94 95 112* 111* 115*  BUN 27* 23 28* 34* 30*  CREATININE 1.37* 1.10 0.99 0.96 0.89  CALCIUM 7.8* 8.2* 8.8* 8.7* 9.0  MG  --   --   --  2.0 2.1   GFR: Estimated Creatinine Clearance: 51.7 mL/min (by C-G formula based on SCr of 0.89 mg/dL).  Liver Function Tests: Recent Labs  Lab 06/21/19 0705  AST 38  ALT 16  ALKPHOS 57  BILITOT 1.3*  PROT 6.3*  ALBUMIN 3.6   No results for input(s): LIPASE, AMYLASE in the last 168 hours. No results for input(s): AMMONIA in the last 168 hours. Coagulation Profile: Recent Labs  Lab 06/22/19 0529  INR 1.2   Cardiac Enzymes: No results for input(s): CKTOTAL, CKMB, CKMBINDEX, TROPONINI in the last 168 hours. BNP (last 3 results) No results for input(s): PROBNP in the last 8760 hours. HbA1C: No results for input(s): HGBA1C in the last 72 hours. CBG: No results for input(s): GLUCAP in the last 168 hours. Lipid Profile: No results for input(s): CHOL, HDL, LDLCALC, TRIG, CHOLHDL, LDLDIRECT in the last 72 hours. Thyroid Function Tests: No results for input(s): TSH, T4TOTAL, FREET4, T3FREE, THYROIDAB in the last 72 hours. Anemia Panel: No results for input(s): VITAMINB12, FOLATE, FERRITIN, TIBC, IRON, RETICCTPCT in the last 72 hours. Sepsis Labs: Recent Labs  Lab 06/21/19 0705 06/21/19 1008 06/22/19 0529  PROCALCITON 38.90  --  27.72  LATICACIDVEN 2.9* 2.4*  --     Recent  Results (from the past 240 hour(s))  Blood Culture (routine x 2)     Status: Abnormal   Collection Time: 06/21/19  7:05 AM   Specimen: BLOOD  Result Value Ref Range Status   Specimen Description   Final    BLOOD L FARM Performed at [facility], [address], [place], [place] [address]    Special Requests   Final    BOTTLES DRAWN AEROBIC AND ANAEROBIC Blood Culture adequate volume Performed at [facility], [address] [place], [place] [address]    Culture  Setup Time   Final    IN BOTH AEROBIC AND ANAEROBIC BOTTLES GRAM NEGATIVE RODS CRITICAL RESULT CALLED TO, READ BACK BY AND VERIFIED WITH: LISA KLUTTZ ON 06/21/19 AT 2006 [facility] Performed at [facility], [place] [address] [place], [place] [address]    Culture ESCHERICHIA COLI (A)  Final   Report Status 06/24/2019 FINAL   Final   Organism ID, Bacteria ESCHERICHIA COLI  Final      Susceptibility   Escherichia coli - MIC*    AMPICILLIN >=32 RESISTANT Resistant     CEFAZOLIN <=4 SENSITIVE Sensitive     CEFEPIME <=0.12 SENSITIVE Sensitive     CEFTAZIDIME <=1 SENSITIVE Sensitive     CEFTRIAXONE <=0.25 SENSITIVE Sensitive     CIPROFLOXACIN >=4 RESISTANT Resistant     GENTAMICIN <=1 SENSITIVE Sensitive     IMIPENEM <=0.25 SENSITIVE Sensitive     TRIMETH/SULFA >=320 RESISTANT Resistant     AMPICILLIN/SULBACTAM >=32 RESISTANT Resistant     PIP/TAZO <=4 SENSITIVE Sensitive     * ESCHERICHIA COLI  Blood Culture (routine x 2)     Status: Abnormal   Collection Time: 06/21/19  7:05 AM   Specimen: BLOOD  Result Value Ref Range Status   Specimen Description   Final    BLOOD R AC Performed at [facility], [address], [place], [place] [address]    Special Requests   Final    BOTTLES DRAWN AEROBIC AND ANAEROBIC Blood Culture adequate volume Performed at [facility], [address] [place], [place] [address]    Culture  Setup Time   Final    IN BOTH AEROBIC AND ANAEROBIC BOTTLES GRAM NEGATIVE RODS CRITICAL VALUE NOTED.  VALUE IS CONSISTENT WITH PREVIOUSLY REPORTED AND CALLED VALUE. Performed at [facility], [address] [place], [place] [address]    Culture (A)  Final    ESCHERICHIA COLI SUSCEPTIBILITIES PERFORMED ON PREVIOUS CULTURE WITHIN THE LAST 5 DAYS. Performed at [facility], [place] [address], [place], [place] [address]    Report Status 06/24/2019 FINAL  Final  Urine culture     Status: Abnormal   Collection Time: 06/21/19  7:05 AM   Specimen: In/Out Cath Urine  Result Value Ref Range Status   Specimen Description   Final    IN/OUT CATH URINE Performed at [facility], [address] [place], [place] [address]    Special Requests   Final    NONE Performed at [facility], [address] [place], [place] [address]    Culture  >=100,000 COLONIES/mL ESCHERICHIA COLI (A)  Final   Report Status 06/23/2019 FINAL  Final   Organism ID, Bacteria ESCHERICHIA COLI (A)  Final      Susceptibility   Escherichia coli - MIC*    AMPICILLIN >=32 RESISTANT Resistant     CEFAZOLIN <=4 SENSITIVE Sensitive     CEFTRIAXONE <=0.25 SENSITIVE Sensitive  CIPROFLOXACIN >=4 RESISTANT Resistant     GENTAMICIN <=1 SENSITIVE Sensitive     IMIPENEM <=0.25 SENSITIVE Sensitive     NITROFURANTOIN <=16 SENSITIVE Sensitive     TRIMETH/SULFA >=320 RESISTANT Resistant     AMPICILLIN/SULBACTAM 16 INTERMEDIATE Intermediate     PIP/TAZO <=4 SENSITIVE Sensitive     * >=100,000 COLONIES/mL ESCHERICHIA COLI  Blood Culture ID Panel (Reflexed)     Status: Abnormal   Collection Time: 06/21/19  7:05 AM  Result Value Ref Range Status   Enterococcus species NOT DETECTED NOT DETECTED Final   Listeria monocytogenes NOT DETECTED NOT DETECTED Final   Staphylococcus species NOT DETECTED NOT DETECTED Final   Staphylococcus aureus (BCID) NOT DETECTED NOT DETECTED Final   Streptococcus species NOT DETECTED NOT DETECTED Final   Streptococcus agalactiae NOT DETECTED NOT DETECTED Final   Streptococcus pneumoniae NOT DETECTED NOT DETECTED Final   Streptococcus pyogenes NOT DETECTED NOT DETECTED Final   Acinetobacter baumannii NOT DETECTED NOT DETECTED Final   Enterobacteriaceae species DETECTED (A) NOT DETECTED Final    Comment: Enterobacteriaceae represent a large family of gram-negative bacteria, not a single organism. CRITICAL RESULT CALLED TO, READ BACK BY AND VERIFIED WITH: LISA KLUTTZ ON 06/21/19 AT 2006 [facility]    Enterobacter cloacae complex NOT DETECTED NOT DETECTED Final   Escherichia coli DETECTED (A) NOT DETECTED Final    Comment: CRITICAL RESULT CALLED TO, READ BACK BY AND VERIFIED WITH: LISA KLUTTZ ON 06/21/19 AT 2006 [facility] [place] [facility]    Klebsiella oxytoca NOT DETECTED NOT DETECTED Final   Klebsiella pneumoniae NOT DETECTED NOT DETECTED Final   Proteus species NOT  DETECTED NOT DETECTED Final   Serratia marcescens NOT DETECTED NOT DETECTED Final   Carbapenem resistance NOT DETECTED NOT DETECTED Final   Haemophilus influenzae NOT DETECTED NOT DETECTED Final   Neisseria meningitidis NOT DETECTED NOT DETECTED Final   Pseudomonas aeruginosa NOT DETECTED NOT DETECTED Final   Candida albicans NOT DETECTED NOT DETECTED Final   Candida glabrata NOT DETECTED NOT DETECTED Final   Candida krusei NOT DETECTED NOT DETECTED Final   Candida parapsilosis NOT DETECTED NOT DETECTED Final   Candida tropicalis NOT DETECTED NOT DETECTED Final    Comment: Performed at [facility], [address] [place], [place] [address]  Respiratory Panel by RT PCR (Flu A&B, Covid) - Nasopharyngeal Swab     Status: None   Collection Time: 06/21/19  9:44 AM   Specimen: Nasopharyngeal Swab  Result Value Ref Range Status   SARS Coronavirus 2 by RT PCR NEGATIVE NEGATIVE Final    Comment: (NOTE) SARS-CoV-2 target nucleic acids are NOT DETECTED. The SARS-CoV-2 RNA is generally detectable in upper respiratoy specimens during the acute phase of infection. The lowest concentration of SARS-CoV-2 viral copies this assay can detect is 131 copies/mL. A negative result does not preclude SARS-Cov-2 infection and should not be used as the sole basis for treatment or other patient management decisions. A negative result may occur with  improper specimen collection/handling, submission of specimen other than nasopharyngeal swab, presence of viral mutation(s) within the areas targeted by this assay, and inadequate number of viral copies (<131 copies/mL). A negative result must be combined with clinical observations, patient history, and epidemiological information. The expected result is Negative. Fact Sheet for Patients:  PinkCheek.be Fact Sheet for Healthcare Providers:  GravelBags.it This test is not yet ap proved or cleared  by the [place] FDA and  has been authorized for detection and/or diagnosis of SARS-CoV-2 by FDA under an Emergency  Use Authorization (EUA). This EUA will remain  in effect (meaning this test can be used) for the duration of the COVID-19 declaration under Section 564(b)(1) of the Act, 21 U.S.C. section 360bbb-3(b)(1), unless the authorization is terminated or revoked sooner.    Influenza A by PCR NEGATIVE NEGATIVE Final   Influenza B by PCR NEGATIVE NEGATIVE Final    Comment: (NOTE) The Xpert Xpress SARS-CoV-2/FLU/RSV assay is intended as an aid in  the diagnosis of influenza from Nasopharyngeal swab specimens and  should not be used as a sole basis for treatment. Nasal washings and  aspirates are unacceptable for Xpert Xpress SARS-CoV-2/FLU/RSV  testing. Fact Sheet for Patients: PinkCheek.be Fact Sheet for Healthcare Providers: GravelBags.it This test is not yet approved or cleared by the [place] FDA and  has been authorized for detection and/or diagnosis of SARS-CoV-2 by  FDA under an Emergency Use Authorization (EUA). This EUA will remain  in effect (meaning this test can be used) for the duration of the  Covid-19 declaration under Section 564(b)(1) of the Act, 21  U.S.C. section 360bbb-3(b)(1), unless the authorization is  terminated or revoked. Performed at [facility] [address] [place], [place] [address]   MRSA PCR Screening     Status: None   Collection Time: 06/24/19  7:59 AM   Specimen: Nasal Mucosa; Nasopharyngeal  Result Value Ref Range Status   MRSA by PCR NEGATIVE NEGATIVE Final    Comment:        The GeneXpert MRSA Assay (FDA approved for NASAL specimens only), is one component of a comprehensive MRSA colonization surveillance program. It is not intended to diagnose MRSA infection nor to guide or monitor treatment for MRSA infections. Performed at [facility],  [address] [place], [place] [address]   Culture, blood (single) w Reflex to ID Panel     Status: None (Preliminary result)   Collection Time: 06/26/19  6:47 PM   Specimen: BLOOD  Result Value Ref Range Status   Specimen Description BLOOD BLOOD LEFT HAND  Final   Special Requests   Final    BOTTLES DRAWN AEROBIC AND ANAEROBIC Blood Culture adequate volume   Culture   Final    NO GROWTH < 12 HOURS Performed at [facility], [address], [place], [place] [address]    Report Status PENDING  Incomplete         Radiology Studies: DG Chest 1 View  Result Date: 06/26/2019 CLINICAL DATA:  Worsening hypoxia.  Aspiration pneumonia EXAM: CHEST  1 VIEW COMPARISON:  Three days ago FINDINGS: Airspace opacity asymmetric to the right. Allowing for differences in rotation there is no convincing change in the overall pattern or extent. No new Kerley lines or pleural fluid. Cardiomegaly is stable. No evidence of pneumothorax. IMPRESSION: Stable pneumonia pattern. Electronically Signed   By: Monte Fantasia M.D.   On: 06/26/2019 06:40        Scheduled Meds: . amLODipine  5 mg Oral Daily  . aspirin EC  81 mg Oral Daily  . atorvastatin  10 mg Oral QHS  . Chlorhexidine Gluconate Cloth  6 each Topical Daily  . enoxaparin (LOVENOX) injection  40 mg Subcutaneous Q24H  . feeding supplement (NEPRO CARB STEADY)  237 mL Oral TID WC  . multivitamin with minerals  1 tablet Oral Daily  . omega-3 acid ethyl esters  2 g Oral Daily  . senna  1 tablet Oral BID  . sodium chloride flush  3 mL Intravenous  Q12H  . tamsulosin  0.4 mg Oral Daily   Continuous Infusions: . sodium chloride 500 mL (06/27/19 2263)  . cefTRIAXone (ROCEPHIN)  IV Stopped (06/26/19 2347)  . dextrose 50 mL/hr at 06/27/19 1156  . metronidazole 500 mg (06/27/19 1456)     LOS: 6 days    Time spent: 40 minutes    Ezekiel Slocumb, DO [facility]   If 7PM-7AM, please contact night-coverage [email or web address]  06/27/2019, 5:44 PM

## 2019-06-28 ENCOUNTER — Inpatient Hospital Stay: Payer: Medicare PPO

## 2019-06-28 LAB — SARS CORONAVIRUS 2 (TAT 6-24 HRS): SARS Coronavirus 2: NEGATIVE

## 2019-06-28 MED ORDER — NEPRO/CARBSTEADY PO LIQD: 237.0000 mL | Freq: Three times a day (TID) | ORAL | 0 refills | Status: DC

## 2019-06-28 MED ORDER — METRONIDAZOLE 500 MG PO TABS: 500.0000 mg | ORAL_TABLET | Freq: Three times a day (TID) | ORAL | 0 refills | Status: AC

## 2019-06-28 MED ORDER — ACETAMINOPHEN 325 MG PO TABS: 650.0000 mg | ORAL_TABLET | Freq: Four times a day (QID) | ORAL | Status: DC | PRN

## 2019-06-28 MED ORDER — CEPHALEXIN 500 MG PO CAPS: 500.0000 mg | ORAL_CAPSULE | Freq: Three times a day (TID) | ORAL | 0 refills | Status: AC

## 2019-06-28 MED ORDER — CEFDINIR 300 MG PO CAPS: 300.0000 mg | ORAL_CAPSULE | Freq: Two times a day (BID) | ORAL | 0 refills | Status: DC

## 2019-06-28 NOTE — Progress Notes (Signed)
Physical Therapy Treatment Patient Details Name: Robert [name] MRN: [id number] DOB: 1920/12/15 Today's Date: 06/28/2019    History of Present Illness Pt is a 86 y.o. 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  Patient was brought in this morning for evaluation of mental status changes which the wife note prior to his admission.  Pt is now s/p cystocopy with L ureteral stent placement (06/21/2019).    PT Comments    Pt in bed upon arrival, awake, pleasant, appears comfortable, denies pain. Wife at bedside. Pt on 2L/min upon entry, noted mouth breathing, Sats fluctuate from 88-91%, improved with additional facemask. Pt moved to 3L for bed mobility in session, SpO2 improved to 94-100%, c mild SOB increased RR. 3 STS transfers in session with emphasis on progressing time in stance, establishing modI balance. Pt has progressive weakness and fwd excursion of RW, trunk flexion to a point of limited safety and inability to correct. Pt has some intermittent confusion, often attempting to go to recliner after cues to sit on EOB. Noted bowel skidmarks on chuck pad, author assisted pt with peri care. Pt assisted to recliner, made comfortable. RN in room at EOS to assess vitals.      Follow Up Recommendations  SNF     Equipment Recommendations  Rolling walker with 5" wheels    Recommendations for Other Services OT consult     Precautions / Restrictions Precautions Precautions: Fall;Other (comment) Restrictions Weight Bearing Restrictions: No    Mobility  Bed Mobility Overal bed mobility: Needs Assistance Bed Mobility: Supine to Sit     Supine to sit: Min assist     General bed mobility comments: minA to sit up fully, maxA for scoot forward to EOB; pt more weak this date, high effort level needed, transition more segmented.  Transfers Overall transfer level: Needs assistance Equipment used:  Rolling walker (2 wheeled) Transfers: Sit to/from [facility] Sit to Stand: From elevated surface;Min guard(3x from elevated EOB, sustained standing 30-60sec, cues for postural correction and safe RW use.) Stand pivot transfers: Min assist       General transfer comment: poor trunk/RW control d/t fatigue; heavy cues to correct with limited capacity to do so.  Ambulation/Gait Ambulation/Gait assistance: (deferred this date, pt more weak, having difficulty controlling posture)               Stairs             Wheelchair Mobility    Modified Rankin (Stroke Patients Only)       Balance Overall balance assessment: Needs assistance Sitting-balance support: Single extremity supported Sitting balance-Leahy Scale: Good     Standing balance support: During functional activity Standing balance-Leahy Scale: Fair Standing balance comment: Patient required use of rolling walker and MinA from Therapist.                             Cognition Arousal/Alertness: Awake/alert Behavior During Therapy: WFL for tasks assessed/performed Overall Cognitive Status: Within Functional Limits for tasks assessed                                 General Comments: HOH precludes a thorough assessment      Exercises      General Comments        Pertinent Vitals/Pain Pain Assessment: No/denies pain  Pain Intervention(s): Limited activity within patient's tolerance;Monitored during session    Home Living                      Prior Function            PT Goals (current goals can now be found in the care plan section) Acute Rehab PT Goals Patient Stated Goal: "do more for myself" PT Goal Formulation: With patient/family Time For Goal Achievement: 07/08/19 Potential to Achieve Goals: Fair Progress towards PT goals: Progressing toward goals    Frequency    Min 2X/week      PT Plan Current plan remains appropriate     Co-evaluation              AM-PAC PT "6 Clicks" Mobility   Outcome Measure  Help needed turning from your back to your side while in a flat bed without using bedrails?: A Little Help needed moving from lying on your back to sitting on the side of a flat bed without using bedrails?: A Little Help needed moving to and from a bed to a chair (including a wheelchair)?: A Little Help needed standing up from a chair using your arms (e.g., wheelchair or bedside chair)?: A Little Help needed to walk in hospital room?: A Lot Help needed climbing 3-5 steps with a railing? : Total 6 Click Score: 15    End of Session Equipment Utilized During Treatment: Gait belt;Oxygen Activity Tolerance: Patient limited by fatigue Patient left: in chair;with call bell/phone within reach;with chair alarm set;with family/visitor present Nurse Communication: Mobility status PT Visit Diagnosis: Other abnormalities of gait and mobility (R26.89);Muscle weakness (generalized) (M62.81);Difficulty in walking, not elsewhere classified (R26.2)     Time: [id number] PT Time Calculation (min) (ACUTE ONLY): 26 min  Charges:  $Therapeutic Exercise: 23-37 mins                     12:33 PM, 06/28/19 Etta Grandchild, PT, DPT Physical Therapist - [facility]  [phone] ([place])    Holt Woolbright C 06/28/2019, 12:26 PM

## 2019-06-28 NOTE — Progress Notes (Signed)
PIV consult: Arrived to room, family member present stated she was told he was going home and no longer needed IV. Message to pt's nurse to confirm/ clarify.

## 2019-06-28 NOTE — Progress Notes (Signed)
Speech Therapy Note  Spoke at length with the patient's wife RE: results and recommendation of the MBS.  She took notes and asked questions, demonstrating comprehension of results.  She was provided with written information RE: recommendations.  Leroy Sea, MS/CCC- SLP

## 2019-06-28 NOTE — Discharge Summary (Addendum)
Physician Discharge Summary  Robert [name] VEL:[id number] DOB: 04/05/1921 DOA: 06/21/2019  PCP: Dion Body, MD  Admit date: 06/21/2019 Discharge date: 06/28/2019  Admitted From: Home Disposition:  SNF, [facility]  Recommendations for Outpatient Follow-up:  1. Follow up with PCP in 1-2 weeks 2. Please obtain BMP/CBC in one week 3. Please follow up with urology in 2-3 weeks 4.   Feeding - very important that staff at facility assist patient with meals and monitor his oral intake, and for signs of aspiration.  Patient requires frequent encouragement to eat and drink.  Home Health: No  Equipment/Devices: None   Discharge Condition: Stable  CODE STATUS: Partial (no intubation or invasive mechanical ventilation)  Diet recommendation:   Dysphagia 3 (mechanical soft); Nectar-thick liquid Liquids provided via: Cup;No straw Medication Administration: Whole meds with puree(Crushed if needed -- for safer swallowing ) Supervision: Patient able to self feed;Staff to assist with self feeding;Intermittent supervision to cue for compensatory strategies Compensations: Minimize environmental distractions;Slow rate;Small sips/bites;Lingual sweep for clearance of pocketing;Multiple dry swallows after each bite/sip;Follow solids with liquid Postural Changes and/or Swallow Maneuvers: Seated upright 90 degrees;Upright 30-60 min after meal;Out of bed for meals  Brief/Interim Summary:  86 year old male with history of CVA, TIAs, hypertension and nephrolithiasis presented with acute change in his mental status of 1 day duration. Patient having poor p.o. intake and nausea with low back pain, subsequently developed chills but no fever.  In the ED, he had significant leukocytosis with WBC of 20 K, UA consistent with UTI, elevated lactic acid of 2.9.  Patient met criteria for sepsis. CT scan with renal study showed 4 mm calculus at this into the left UPJ with mild proximal left hydroureteronephrosis. Patient  given IV fluids. Sepsis pathway initiated. Patient was admitted, empiric antibiotic started and urology consulted.  Patient taken to the OR for cystoscopy with retrograde pyelogram and stent placement. Initial blood cultures positive for E. Coli in addition to the UTI.  Covered with Rocephin and repeat blood cultures are no growth to date.  Hospital course was prolonged due to development of aspiration pneumonia and acute respiratory failure with hypoxia.  Patient evaluated by SLP, transitioned to dysphagia 3 diet with nectar thick liquids.  Patient continues to have very poor appetite and little PO intake.  Patient's hypoxia improved, but not fully resolved prior to discharge.  He continues to require 2-3 L/min oxygen to keep saturation over 90%.    Patient is clinically improved and medically stable to discharge to rehab today.  He is to complete 4 more days antibiotic for aspiration pneumonia, and 7 more days for bacteremia.     Discharge Diagnoses: Principal Problem:   Bacteremia due to Escherichia coli Active Problems:   Sepsis ([name])   UTI (urinary tract infection)   Hydronephrosis with urinary obstruction due to ureteral calculus   Acute metabolic encephalopathy   Ureteral calculus   HTN (hypertension)   Pressure injury of skin  Sepsis- resolved Secondary to E. coli UTI with bacteremia. --ContinueRocephin and Flagyl for bacteremia and aspiration pneumonia. --Plan to discharge on omnicef +/- flagyl to cover for both aspiration and E. coli bacteremia --Continue Keflex x 7 days for bacteremia  --follow up repeat blood cx's - no growth to date at discharge  Acute respiratory failure with hypoxia  Aspiration pneumonia into the right upper lung Went into respiratory distress on 3/7 requiring 6 L nasal cannula. Chest x-ray showing right upper lobe infiltrate. SLP following and diet change to dysphagia level3withnectar thickenedliquids. --aspiration  precautions --antibiotics as  above --wean off oxygen as tolerated, use supplemental O2 to keep sats >90% --Continue Flagyl x 4 more days   Hypernatremia - Na 147 on 3/11, previously normal.  Due to patient not eating or drinking.  Gentle D5W given.  Dysphagia - SLP following.  Modified barium swallow study to be done this afternoon, prior to discharge. --NDD-3 diet with nectar thick liquids for now  Hydronephrosis with urinary obstruction due to ureteral calculus S/p retrograde pyelogram and left ureteral stent placement.Foley removed and voiding without difficulty.  Acute kidney injury - Likely combination of ATN with sepsis and postobstructive. Resolved with IV hydration.  Acute pulmonary edema - resolved with diuresis. Secondary to volume overload with IV hydration.   Acute metabolic encephalopathy Secondary to sepsis. Resolved.  Essential hypertension Continue amlodipine.  History of CVA Continue aspirin and statin.  Thrombocytopenia - improving Mild, possibly due to sepsis. Monitor CBC.  Hypokalemia Replenished.  Monitor BMP.  Pressure injury of sacrum, unknown if present on admission.   --Frequent position changes, at least every 2 hours.  Monitor closely.  Pressure Injury 06/24/19 Sacrum Posterior Stage 1 -  Intact skin with non-blanchable redness of a localized area usually over a bony prominence. (Active)  06/24/19 0934  Location: Sacrum  Location Orientation: Posterior  Staging: Stage 1 -  Intact skin with non-blanchable redness of a localized area usually over a bony prominence.  Wound Description (Comments):   Present on Admission:      Discharge Instructions   Discharge Instructions    Call MD for:   Complete by: As directed    Difficulty breathing or respiratory distress   Call MD for:  temperature >100.4   Complete by: As directed    Diet - low sodium heart healthy   Complete by: As directed    Increase activity slowly   Complete by: As directed       Allergies as of 06/28/2019      Reactions   Morphine And Related Nausea And Vomiting   Tramadol Nausea And Vomiting   Sulfamethoxazole-trimethoprim Rash      Medication List    TAKE these medications   acetaminophen 325 MG tablet Commonly known as: TYLENOL Take 2 tablets (650 mg total) by mouth every 6 (six) hours as needed for mild pain (or Fever >/= 101).   amLODipine 5 MG tablet Commonly known as: NORVASC Take 5 mg by mouth daily.   aspirin EC 81 MG tablet Take 81 mg by mouth daily.   atorvastatin 10 MG tablet Commonly known as: LIPITOR Take 10 mg by mouth at bedtime.   cephALEXin 500 MG capsule Commonly known as: KEFLEX Take 1 capsule (500 mg total) by mouth 3 (three) times daily for 7 days.   feeding supplement (NEPRO CARB STEADY) Liqd Take 237 mLs by mouth 3 (three) times daily with meals.   FISH OIL PO Take 1 capsule by mouth daily.   metroNIDAZOLE 500 MG tablet Commonly known as: Flagyl Take 1 tablet (500 mg total) by mouth 3 (three) times daily for 4 days.   multivitamin with minerals Tabs tablet Take 1 tablet by mouth daily.   senna 8.6 MG Tabs tablet Commonly known as: SENOKOT Take 1 tablet (8.6 mg total) by mouth 2 (two) times daily.   tamsulosin 0.4 MG Caps capsule Commonly known as: FLOMAX Take 0.4 mg by mouth daily.            Durable Medical Equipment  (From admission, onward)  Start     Ordered   06/28/19 1255  For home use only DME oxygen  Once    Comments: 2-3 L/min to keep O2 sat > 90%  Question Answer Comment  Length of Need 6 Months   Mode or (Route) Nasal cannula   Liters per Minute 3   Oxygen delivery system Gas      06/28/19 1255         Follow-up Information    Dion Body, MD. Schedule an appointment as soon as possible for a visit in 1 week(s).   Specialty: Family Medicine Contact information: [place] [address] [phone]        [name], MD. Schedule an appointment as soon as possible for a visit in 3 week(s).   Specialty: Urology Contact information: [place] [address] [phone]          Allergies  Allergen Reactions  . Morphine And Related Nausea And Vomiting  . Tramadol Nausea And Vomiting  . Sulfamethoxazole-Trimethoprim Rash    Consultations:  Urology  Palliative Care   Procedures/Studies: DG Chest 1 View  Result Date: 06/26/2019 CLINICAL DATA:  Worsening hypoxia.  Aspiration pneumonia EXAM: CHEST  1 VIEW COMPARISON:  Three days ago FINDINGS: Airspace opacity asymmetric to the right. Allowing for differences in rotation there is no convincing change in the overall pattern or extent. No new Kerley lines or pleural fluid. Cardiomegaly is stable. No evidence of pneumothorax. IMPRESSION: Stable pneumonia pattern. Electronically Signed   By: Monte Fantasia M.D.   On: 06/26/2019 06:40   DG Abd 1 View  Result Date: 06/22/2019 CLINICAL DATA:  Obstructing nephrolithiasis. EXAM: ABDOMEN - 1 VIEW COMPARISON:  Abdomen and pelvis CT obtained earlier today. FINDINGS: Normal bowel gas pattern. Interval left ureteral stent in satisfactory position. No radiographically visible calculi. Bilateral hip prostheses. Thoracic and lumbar spine degenerative changes and mild scoliosis. IMPRESSION: No acute abnormality. Interval left ureteral stent in satisfactory position. Electronically Signed   By: Claudie Revering M.D.   On: 06/22/2019 13:49   DG Chest Port 1 View  Result Date: 06/23/2019 CLINICAL DATA:  86 year old male with shortness of breath. EXAM: PORTABLE CHEST 1 VIEW COMPARISON:  Chest radiograph dated 06/21/2019. FINDINGS: Diffuse interstitial prominence and hazy airspace densities throughout the lungs, with a newly developed area of airspace opacity in the right upper lung field most concerning for pneumonia. Clinical correlation and follow-up recommended. A small left pleural  effusion may be present. There is no pneumothorax. There is mild cardiomegaly. Atherosclerotic calcification of the aorta. No acute osseous pathology. IMPRESSION: Findings most concerning for pneumonia. Clinical correlation and follow-up recommended. Electronically Signed   By: Anner Crete M.D.   On: 06/23/2019 15:42   DG Chest Port 1 View  Result Date: 06/21/2019 CLINICAL DATA:  Altered mental status EXAM: PORTABLE CHEST 1 VIEW COMPARISON:  02/19/2018 chest radiograph. FINDINGS: Stable cardiomediastinal silhouette with normal heart size. No pneumothorax. No significant pleural effusions. No pulmonary edema. Mild bibasilar scarring versus atelectasis, unchanged. No acute consolidative airspace disease. IMPRESSION: Stable mild bibasilar scarring versus atelectasis. Electronically Signed   By: Ilona Sorrel M.D.   On: 06/21/2019 08:00   DG OR UROLOGY CYSTO IMAGE ([facility] ONLY)  Result Date: 06/21/2019 There is no interpretation for this exam.  This order is for images obtained during a surgical procedure.  Please See "Surgeries" Tab for more information regarding the procedure.   CT Renal Stone  Study  Result Date: 06/21/2019 CLINICAL DATA:  86 year old male with history of left-sided flank pain. Suspected kidney stones. EXAM: CT ABDOMEN AND PELVIS WITHOUT CONTRAST TECHNIQUE: Multidetector CT imaging of the abdomen and pelvis was performed following the standard protocol without IV contrast. COMPARISON:  CT the abdomen and pelvis 02/19/2018. FINDINGS: Lower chest: Mild chronic scarring in the left lower lobe, similar to the prior study. Aortic atherosclerosis. Calcifications of the aortic valve and mitral annulus. Hepatobiliary: No definite cystic or solid hepatic lesions are confidently identified on today's noncontrast CT examination. Calcified gallstones are noted in the gallbladder measuring up to 2.2 cm in diameter. No findings to suggest an acute cholecystitis at this time. Pancreas: No definite  pancreatic mass or peripancreatic fluid collections or inflammatory changes are noted on today's noncontrast CT examination. Spleen: Unremarkable. Adrenals/Urinary Tract: Nonobstructive calculi are noted within the collecting systems of both kidneys, largest of which is in the lower pole of the right kidney measuring 1.1 cm in diameter. In addition, there is a 4 mm calculus adjacent to the left ureterovesicular junction in the distal third of the left ureter (axial image 77 of series 2). This is associated with minimal proximal left hydroureteronephrosis indicative of very mild obstruction. Unenhanced appearance of the kidneys is otherwise unremarkable. Urinary bladder is normal in appearance. Bilateral adrenal glands are normal in appearance. Stomach/Bowel: Unenhanced appearance of the stomach is normal. No pathologic dilatation of small bowel or colon. Normal appendix. Vascular/Lymphatic: Aortic atherosclerosis. No lymphadenopathy noted in the abdomen or pelvis. Reproductive: Prostate gland and seminal vesicles are poorly visualized secondary to beam hardening artifact from the patient's bilateral hip arthroplasties. Other: No significant volume of ascites.  No pneumoperitoneum. Musculoskeletal: Status post bilateral hip arthroplasty. There are no aggressive appearing lytic or blastic lesions noted in the visualized portions of the skeleton. IMPRESSION: 1. 4 mm calculus adjacent to the left ureterovesicular junction with very mild proximal left hydroureteronephrosis indicative of very mild obstruction. 2. Other nonobstructive calculi in the collecting systems of the kidneys bilaterally measuring up to 1.1 cm in diameter in the lower pole collecting system of the right kidney. 3. Cholelithiasis without evidence of acute cholecystitis at this time. 4. Aortic atherosclerosis. 5. There are calcifications of the aortic valve and mitral annulus. Echocardiographic correlation for evaluation of potential valvular  dysfunction may be warranted if clinically indicated. Electronically Signed   By: Vinnie Langton M.D.   On: 06/21/2019 09:11         Subjective: Patient seen with wife at bedside.  Did not eat any of his breakfast.  States he has no appetite.  Denies pain, fever, chills, SOB, chest pain or other acute complaints.  No acute events reported.   Discharge Exam: Vitals:   06/28/19 0812 06/28/19 1210  BP: (!) 154/75 124/82  Pulse: 65 76  Resp: 18 18  Temp: 98.4 F (36.9 C) 98.2 F (36.8 C)  SpO2: (!) 87% 94%   Vitals:   06/27/19 1950 06/28/19 0438 06/28/19 0812 06/28/19 1210  BP: (!) 151/70 (!) 149/94 (!) 154/75 124/82  Pulse: 66 65 65 76  Resp: _0 Temp: 98.2 F (36.8 C) 98.5 F (36.9 C) 98.4 F (36.9 C) 98.2 F (36.8 C)  TempSrc:      SpO2: 91% 90% (!) 87% 94%  Weight:  84.5 kg    Height:        General: Pt is alert, awake, not in acute distress Cardiovascular: RRR, S1/S2 +, no rubs, no gallops  Respiratory: CTA bilaterally, decreased at bases, no wheezing, no rhonchi Abdominal: Soft, NT, ND, bowel sounds + Extremities: no edema, no cyanosis    The results of significant diagnostics from this hospitalization (including imaging, microbiology, ancillary and laboratory) are listed below for reference.     Microbiology: Recent Results (from the past 240 hour(s))  Blood Culture (routine x 2)     Status: Abnormal   Collection Time: 06/21/19  7:05 AM   Specimen: BLOOD  Result Value Ref Range Status   Specimen Description   Final    BLOOD L FARM Performed at [facility], [address] [place], [place] [address]    Special Requests   Final    BOTTLES DRAWN AEROBIC AND ANAEROBIC Blood Culture adequate volume Performed at [facility], [address] [place], [place] [address]    Culture  Setup Time   Final    IN BOTH AEROBIC AND ANAEROBIC BOTTLES GRAM NEGATIVE RODS CRITICAL RESULT CALLED TO, READ BACK BY AND VERIFIED WITH: LISA  KLUTTZ ON 06/21/19 AT 2006 [facility] Performed at [facility], [address], [place], [place] [address]    Culture ESCHERICHIA COLI (A)  Final   Report Status 06/24/2019 FINAL  Final   Organism ID, Bacteria ESCHERICHIA COLI  Final      Susceptibility   Escherichia coli - MIC*    AMPICILLIN >=32 RESISTANT Resistant     CEFAZOLIN <=4 SENSITIVE Sensitive     CEFEPIME <=0.12 SENSITIVE Sensitive     CEFTAZIDIME <=1 SENSITIVE Sensitive     CEFTRIAXONE <=0.25 SENSITIVE Sensitive     CIPROFLOXACIN >=4 RESISTANT Resistant     GENTAMICIN <=1 SENSITIVE Sensitive     IMIPENEM <=0.25 SENSITIVE Sensitive     TRIMETH/SULFA >=320 RESISTANT Resistant     AMPICILLIN/SULBACTAM >=32 RESISTANT Resistant     PIP/TAZO <=4 SENSITIVE Sensitive     * ESCHERICHIA COLI  Blood Culture (routine x 2)     Status: Abnormal   Collection Time: 06/21/19  7:05 AM   Specimen: BLOOD  Result Value Ref Range Status   Specimen Description   Final    BLOOD R AC Performed at [facility], [address] [place], [place] [address]    Special Requests   Final    BOTTLES DRAWN AEROBIC AND ANAEROBIC Blood Culture adequate volume Performed at [facility], [address] [employer] [place], [place] [address]    Culture  Setup Time   Final    IN BOTH AEROBIC AND ANAEROBIC BOTTLES GRAM NEGATIVE RODS CRITICAL VALUE NOTED.  VALUE IS CONSISTENT WITH PREVIOUSLY REPORTED AND CALLED VALUE. Performed at [facility], [address] [place], [place] [address]    Culture (A)  Final    ESCHERICHIA COLI SUSCEPTIBILITIES PERFORMED ON PREVIOUS CULTURE WITHIN THE LAST 5 DAYS. Performed at [facility], [place] [address], [place], [place] [address]    Report Status 06/24/2019 FINAL  Final  Urine culture     Status: Abnormal   Collection Time: 06/21/19  7:05 AM   Specimen: In/Out Cath Urine  Result Value Ref Range Status   Specimen Description   Final    IN/OUT CATH URINE Performed at [facility],  [address] [place], [place] [address]    Special Requests   Final    NONE Performed at [facility], [address] [place],  [address]    Culture >=100,000 COLONIES/mL ESCHERICHIA COLI (A)  Final   Report Status  06/23/2019 FINAL  Final   Organism ID, Bacteria ESCHERICHIA COLI (A)  Final      Susceptibility   Escherichia coli - MIC*    AMPICILLIN >=32 RESISTANT Resistant     CEFAZOLIN <=4 SENSITIVE Sensitive     CEFTRIAXONE <=0.25 SENSITIVE Sensitive     CIPROFLOXACIN >=4 RESISTANT Resistant     GENTAMICIN <=1 SENSITIVE Sensitive     IMIPENEM <=0.25 SENSITIVE Sensitive     NITROFURANTOIN <=16 SENSITIVE Sensitive     TRIMETH/SULFA >=320 RESISTANT Resistant     AMPICILLIN/SULBACTAM 16 INTERMEDIATE Intermediate     PIP/TAZO <=4 SENSITIVE Sensitive     * >=100,000 COLONIES/mL ESCHERICHIA COLI  Blood Culture ID Panel (Reflexed)     Status: Abnormal   Collection Time: 06/21/19  7:05 AM  Result Value Ref Range Status   Enterococcus species NOT DETECTED NOT DETECTED Final   Listeria monocytogenes NOT DETECTED NOT DETECTED Final   Staphylococcus species NOT DETECTED NOT DETECTED Final   Staphylococcus aureus (BCID) NOT DETECTED NOT DETECTED Final   Streptococcus species NOT DETECTED NOT DETECTED Final   Streptococcus agalactiae NOT DETECTED NOT DETECTED Final   Streptococcus pneumoniae NOT DETECTED NOT DETECTED Final   Streptococcus pyogenes NOT DETECTED NOT DETECTED Final   Acinetobacter baumannii NOT DETECTED NOT DETECTED Final   Enterobacteriaceae species DETECTED (A) NOT DETECTED Final    Comment: Enterobacteriaceae represent a large family of gram-negative bacteria, not a single organism. CRITICAL RESULT CALLED TO, READ BACK BY AND VERIFIED WITH: LISA KLUTTZ ON 06/21/19 AT 2006 [facility]    Enterobacter cloacae complex NOT DETECTED NOT DETECTED Final   Escherichia coli DETECTED (A) NOT DETECTED Final    Comment: CRITICAL RESULT CALLED TO, READ BACK BY AND VERIFIED  WITH: LISA KLUTTZ ON 06/21/19 AT 2006 [facility]    Klebsiella oxytoca NOT DETECTED NOT DETECTED Final   Klebsiella pneumoniae NOT DETECTED NOT DETECTED Final   Proteus species NOT DETECTED NOT DETECTED Final   Serratia marcescens NOT DETECTED NOT DETECTED Final   Carbapenem resistance NOT DETECTED NOT DETECTED Final   Haemophilus influenzae NOT DETECTED NOT DETECTED Final   Neisseria meningitidis NOT DETECTED NOT DETECTED Final   Pseudomonas aeruginosa NOT DETECTED NOT DETECTED Final   Candida albicans NOT DETECTED NOT DETECTED Final   Candida glabrata NOT DETECTED NOT DETECTED Final   Candida krusei NOT DETECTED NOT DETECTED Final   Candida parapsilosis NOT DETECTED NOT DETECTED Final   Candida tropicalis NOT DETECTED NOT DETECTED Final    Comment: Performed at [facility], [address] [place], [place] [address]  Respiratory Panel by RT PCR (Flu A&B, Covid) - Nasopharyngeal Swab     Status: None   Collection Time: 06/21/19  9:44 AM   Specimen: Nasopharyngeal Swab  Result Value Ref Range Status   SARS Coronavirus 2 by RT PCR NEGATIVE NEGATIVE Final    Comment: (NOTE) SARS-CoV-2 target nucleic acids are NOT DETECTED. The SARS-CoV-2 RNA is generally detectable in upper respiratoy specimens during the acute phase of infection. The lowest concentration of SARS-CoV-2 viral copies this assay can detect is 131 copies/mL. A negative result does not preclude SARS-Cov-2 infection and should not be used as the sole basis for treatment or other patient management decisions. A negative result may occur with  improper specimen collection/handling, submission of specimen other than nasopharyngeal swab, presence of viral mutation(s) within the areas targeted by this assay, and inadequate number of viral copies (<131 copies/mL). A negative result must be combined with clinical observations, patient  history, and epidemiological information. The expected result is Negative. Fact Sheet for  Patients:  PinkCheek.be Fact Sheet for Healthcare Providers:  GravelBags.it This test is not yet ap proved or cleared by the [place] FDA and  has been authorized for detection and/or diagnosis of SARS-CoV-2 by FDA under an Emergency Use Authorization (EUA). This EUA will remain  in effect (meaning this test can be used) for the duration of the COVID-19 declaration under Section 564(b)(1) of the Act, 21 U.S.C. section 360bbb-3(b)(1), unless the authorization is terminated or revoked sooner.    Influenza A by PCR NEGATIVE NEGATIVE Final   Influenza B by PCR NEGATIVE NEGATIVE Final    Comment: (NOTE) The Xpert Xpress SARS-CoV-2/FLU/RSV assay is intended as an aid in  the diagnosis of influenza from Nasopharyngeal swab specimens and  should not be used as a sole basis for treatment. Nasal washings and  aspirates are unacceptable for Xpert Xpress SARS-CoV-2/FLU/RSV  testing. Fact Sheet for Patients: PinkCheek.be Fact Sheet for Healthcare Providers: GravelBags.it This test is not yet approved or cleared by the [place] FDA and  has been authorized for detection and/or diagnosis of SARS-CoV-2 by  FDA under an Emergency Use Authorization (EUA). This EUA will remain  in effect (meaning this test can be used) for the duration of the  Covid-19 declaration under Section 564(b)(1) of the Act, 21  U.S.C. section 360bbb-3(b)(1), unless the authorization is  terminated or revoked. Performed at [facility], [address] [place], [place] [address]   MRSA PCR Screening     Status: None   Collection Time: 06/24/19  7:59 AM   Specimen: Nasal Mucosa; Nasopharyngeal  Result Value Ref Range Status   MRSA by PCR NEGATIVE NEGATIVE Final    Comment:        The GeneXpert MRSA Assay (FDA approved for NASAL specimens only), is one component of a comprehensive MRSA  colonization surveillance program. It is not intended to diagnose MRSA infection nor to guide or monitor treatment for MRSA infections. Performed at [facility], [address] [place], [place] [address]   Culture, blood (single) w Reflex to ID Panel     Status: None (Preliminary result)   Collection Time: 06/26/19  6:47 PM   Specimen: BLOOD  Result Value Ref Range Status   Specimen Description BLOOD BLOOD LEFT HAND  Final   Special Requests   Final    BOTTLES DRAWN AEROBIC AND ANAEROBIC Blood Culture adequate volume   Culture   Final    NO GROWTH 2 DAYS Performed at [facility], [address], [place], [place] [address]    Report Status PENDING  Incomplete  SARS CORONAVIRUS 2 (TAT 6-24 HRS) Nasopharyngeal Nasopharyngeal Swab     Status: None   Collection Time: 06/27/19  3:00 PM   Specimen: Nasopharyngeal Swab  Result Value Ref Range Status   SARS Coronavirus 2 NEGATIVE NEGATIVE Final    Comment: (NOTE) SARS-CoV-2 target nucleic acids are NOT DETECTED. The SARS-CoV-2 RNA is generally detectable in upper and lower respiratory specimens during the acute phase of infection. Negative results do not preclude SARS-CoV-2 infection, do not rule out co-infections with other pathogens, and should not be used as the sole basis for treatment or other patient management decisions. Negative results must be combined with clinical observations, patient history, and epidemiological information. The expected result is Negative. Fact Sheet for Patients: SugarRoll.be Fact Sheet for Healthcare Providers: https://www.woods-mathews.com/ This test is not yet approved or cleared by the  [place] Architectural technologist and  has been authorized for detection and/or diagnosis of SARS-CoV-2 by FDA under an Print production planner (EUA). This EUA will remain  in effect (meaning this test can be used) for the duration of the COVID-19 declaration under  Section 56 4(b)(1) of the Act, 21 U.S.C. section 360bbb-3(b)(1), unless the authorization is terminated or revoked sooner. Performed at [facility], [place] [address] [place], [place] [address]      Labs: BNP (last 3 results) No results for input(s): BNP in the last 8760 hours. Basic Metabolic Panel: Recent Labs  Lab 06/22/19 0529 06/23/19 0508 06/25/19 0515 06/26/19 0717 06/27/19 0439  NA 143 142 144 145 147*  K 4.1 3.9 3.4* 3.4* 4.2  CL 114* 112* 110 110 113*  CO2 22 20* _0 GLUCOSE 94 95 112* 111* 115*  BUN 27* 23 28* 34* 30*  CREATININE 1.37* 1.10 0.99 0.96 0.89  CALCIUM 7.8* 8.2* 8.8* 8.7* 9.0  MG  --   --   --  2.0 2.1   Liver Function Tests: No results for input(s): AST, ALT, ALKPHOS, BILITOT, PROT, ALBUMIN in the last 168 hours. No results for input(s): LIPASE, AMYLASE in the last 168 hours. No results for input(s): AMMONIA in the last 168 hours. CBC: Recent Labs  Lab 06/22/19 0529 06/23/19 0508 06/25/19 0515 06/26/19 0717  WBC 8.8 8.9 10.9* 10.5  NEUTROABS  --   --   --  7.2  HGB 12.0* 11.6* 12.2* 11.2*  HCT 36.0* 36.0* 35.7* 33.6*  MCV 98.6 98.6 94.7 96.0  PLT 102* 93* 111* 129*   Cardiac Enzymes: No results for input(s): CKTOTAL, CKMB, CKMBINDEX, TROPONINI in the last 168 hours. BNP: Invalid input(s): POCBNP CBG: No results for input(s): GLUCAP in the last 168 hours. D-Dimer No results for input(s): DDIMER in the last 72 hours. Hgb A1c No results for input(s): HGBA1C in the last 72 hours. Lipid Profile No results for input(s): CHOL, HDL, LDLCALC, TRIG, CHOLHDL, LDLDIRECT in the last 72 hours. Thyroid function studies No results for input(s): TSH, T4TOTAL, T3FREE, THYROIDAB in the last 72 hours.  Invalid input(s): FREET3 Anemia work up No results for input(s): VITAMINB12, FOLATE, FERRITIN, TIBC, IRON, RETICCTPCT in the last 72 hours. Urinalysis    Component Value Date/Time   COLORURINE YELLOW 06/21/2019 0705   APPEARANCEUR HAZY  (A) 06/21/2019 0705   APPEARANCEUR Cloudy (A) 04/03/2018 0915   LABSPEC 1.020 06/21/2019 0705   PHURINE 5.0 06/21/2019 0705   GLUCOSEU NEGATIVE 06/21/2019 0705   HGBUR SMALL (A) 06/21/2019 0705   BILIRUBINUR NEGATIVE 06/21/2019 0705   BILIRUBINUR Negative 04/03/2018 0915   KETONESUR 15 (A) 06/21/2019 0705   PROTEINUR 30 (A) 06/21/2019 0705   NITRITE POSITIVE (A) 06/21/2019 0705   LEUKOCYTESUR SMALL (A) 06/21/2019 0705   Sepsis Labs Invalid input(s): PROCALCITONIN,  WBC,  LACTICIDVEN Microbiology Recent Results (from the past 240 hour(s))  Blood Culture (routine x 2)     Status: Abnormal   Collection Time: 06/21/19  7:05 AM   Specimen: BLOOD  Result Value Ref Range Status   Specimen Description   Final    BLOOD L FARM Performed at [facility], [address], [place], [place] [address]    Special Requests   Final    BOTTLES DRAWN AEROBIC AND ANAEROBIC Blood Culture adequate volume Performed at [facility], [address], [place],  [address]    Culture  Setup Time   Final    IN  BOTH AEROBIC AND ANAEROBIC BOTTLES GRAM NEGATIVE RODS CRITICAL RESULT CALLED TO, READ BACK BY AND VERIFIED WITH: [place] ON 06/21/19 AT 2006 [facility] Performed at [facility], [place] [address] [place], [place] [address]    Culture ESCHERICHIA COLI (A)  Final   Report Status 06/24/2019 FINAL  Final   Organism ID, Bacteria ESCHERICHIA COLI  Final      Susceptibility   Escherichia coli - MIC*    AMPICILLIN >=32 RESISTANT Resistant     CEFAZOLIN <=4 SENSITIVE Sensitive     CEFEPIME <=0.12 SENSITIVE Sensitive     CEFTAZIDIME <=1 SENSITIVE Sensitive     CEFTRIAXONE <=0.25 SENSITIVE Sensitive     CIPROFLOXACIN >=4 RESISTANT Resistant     GENTAMICIN <=1 SENSITIVE Sensitive     IMIPENEM <=0.25 SENSITIVE Sensitive     TRIMETH/SULFA >=320 RESISTANT Resistant     AMPICILLIN/SULBACTAM >=32 RESISTANT Resistant     PIP/TAZO <=4 SENSITIVE Sensitive     * ESCHERICHIA COLI   Blood Culture (routine x 2)     Status: Abnormal   Collection Time: 06/21/19  7:05 AM   Specimen: BLOOD  Result Value Ref Range Status   Specimen Description   Final    BLOOD R AC Performed at [facility], [address] [place], [place] [address]    Special Requests   Final    BOTTLES DRAWN AEROBIC AND ANAEROBIC Blood Culture adequate volume Performed at [facility], [address] [place], [place] [address]    Culture  Setup Time   Final    IN BOTH AEROBIC AND ANAEROBIC BOTTLES GRAM NEGATIVE RODS CRITICAL VALUE NOTED.  VALUE IS CONSISTENT WITH PREVIOUSLY REPORTED AND CALLED VALUE. Performed at [facility], [address] [place], [place] [address]    Culture (A)  Final    ESCHERICHIA COLI SUSCEPTIBILITIES PERFORMED ON PREVIOUS CULTURE WITHIN THE LAST 5 DAYS. Performed at [facility], [place] [address], [place], [place] [address]    Report Status 06/24/2019 FINAL  Final  Urine culture     Status: Abnormal   Collection Time: 06/21/19  7:05 AM   Specimen: In/Out Cath Urine  Result Value Ref Range Status   Specimen Description   Final    IN/OUT CATH URINE Performed at [facility], [address] [place], [place] [address]    Special Requests   Final    NONE Performed at [facility], [address] [place], [place] [address]    Culture >=100,000 COLONIES/mL ESCHERICHIA COLI (A)  Final   Report Status 06/23/2019 FINAL  Final   Organism ID, Bacteria ESCHERICHIA COLI (A)  Final      Susceptibility   Escherichia coli - MIC*    AMPICILLIN >=32 RESISTANT Resistant     CEFAZOLIN <=4 SENSITIVE Sensitive     CEFTRIAXONE <=0.25 SENSITIVE Sensitive     CIPROFLOXACIN >=4 RESISTANT Resistant     GENTAMICIN <=1 SENSITIVE Sensitive     IMIPENEM <=0.25 SENSITIVE Sensitive     NITROFURANTOIN <=16 SENSITIVE Sensitive     TRIMETH/SULFA >=320 RESISTANT Resistant     AMPICILLIN/SULBACTAM 16 INTERMEDIATE Intermediate      PIP/TAZO <=4 SENSITIVE Sensitive     * >=100,000 COLONIES/mL ESCHERICHIA COLI  Blood Culture ID Panel (Reflexed)     Status: Abnormal   Collection Time: 06/21/19  7:05 AM  Result Value Ref Range Status   Enterococcus species NOT DETECTED NOT DETECTED Final   Listeria monocytogenes NOT DETECTED NOT DETECTED  Final   Staphylococcus species NOT DETECTED NOT DETECTED Final   Staphylococcus aureus (BCID) NOT DETECTED NOT DETECTED Final   Streptococcus species NOT DETECTED NOT DETECTED Final   Streptococcus agalactiae NOT DETECTED NOT DETECTED Final   Streptococcus pneumoniae NOT DETECTED NOT DETECTED Final   Streptococcus pyogenes NOT DETECTED NOT DETECTED Final   Acinetobacter baumannii NOT DETECTED NOT DETECTED Final   Enterobacteriaceae species DETECTED (A) NOT DETECTED Final    Comment: Enterobacteriaceae represent a large family of gram-negative bacteria, not a single organism. CRITICAL RESULT CALLED TO, READ BACK BY AND VERIFIED WITH: LISA KLUTTZ ON 06/21/19 AT 2006 [facility]    Enterobacter cloacae complex NOT DETECTED NOT DETECTED Final   Escherichia coli DETECTED (A) NOT DETECTED Final    Comment: CRITICAL RESULT CALLED TO, READ BACK BY AND VERIFIED WITH: LISA KLUTTZ ON 06/21/19 AT 2006 [facility]    Klebsiella oxytoca NOT DETECTED NOT DETECTED Final   Klebsiella pneumoniae NOT DETECTED NOT DETECTED Final   Proteus species NOT DETECTED NOT DETECTED Final   Serratia marcescens NOT DETECTED NOT DETECTED Final   Carbapenem resistance NOT DETECTED NOT DETECTED Final   Haemophilus influenzae NOT DETECTED NOT DETECTED Final   Neisseria meningitidis NOT DETECTED NOT DETECTED Final   Pseudomonas aeruginosa NOT DETECTED NOT DETECTED Final   Candida albicans NOT DETECTED NOT DETECTED Final   Candida glabrata NOT DETECTED NOT DETECTED Final   Candida krusei NOT DETECTED NOT DETECTED Final   Candida parapsilosis NOT DETECTED NOT DETECTED Final   Candida tropicalis NOT DETECTED NOT DETECTED Final     Comment: Performed at [facility], [address] [place], [place] [address]  Respiratory Panel by RT PCR (Flu A&B, Covid) - Nasopharyngeal Swab     Status: None   Collection Time: 06/21/19  9:44 AM   Specimen: Nasopharyngeal Swab  Result Value Ref Range Status   SARS Coronavirus 2 by RT PCR NEGATIVE NEGATIVE Final    Comment: (NOTE) SARS-CoV-2 target nucleic acids are NOT DETECTED. The SARS-CoV-2 RNA is generally detectable in upper respiratoy specimens during the acute phase of infection. The lowest concentration of SARS-CoV-2 viral copies this assay can detect is 131 copies/mL. A negative result does not preclude SARS-Cov-2 infection and should not be used as the sole basis for treatment or other patient management decisions. A negative result may occur with  improper specimen collection/handling, submission of specimen other than nasopharyngeal swab, presence of viral mutation(s) within the areas targeted by this assay, and inadequate number of viral copies (<131 copies/mL). A negative result must be combined with clinical observations, patient history, and epidemiological information. The expected result is Negative. Fact Sheet for Patients:  PinkCheek.be Fact Sheet for Healthcare Providers:  GravelBags.it This test is not yet ap proved or cleared by the [place] FDA and  has been authorized for detection and/or diagnosis of SARS-CoV-2 by FDA under an Emergency Use Authorization (EUA). This EUA will remain  in effect (meaning this test can be used) for the duration of the COVID-19 declaration under Section 564(b)(1) of the Act, 21 U.S.C. section 360bbb-3(b)(1), unless the authorization is terminated or revoked sooner.    Influenza A by PCR NEGATIVE NEGATIVE Final   Influenza B by PCR NEGATIVE NEGATIVE Final    Comment: (NOTE) The Xpert Xpress SARS-CoV-2/FLU/RSV assay is intended as an aid in  the  diagnosis of influenza from Nasopharyngeal swab specimens and  should not be used as a sole basis for treatment. Nasal washings and  aspirates are unacceptable  for Xpert Xpress SARS-CoV-2/FLU/RSV  testing. Fact Sheet for Patients: PinkCheek.be Fact Sheet for Healthcare Providers: GravelBags.it This test is not yet approved or cleared by the [place] FDA and  has been authorized for detection and/or diagnosis of SARS-CoV-2 by  FDA under an Emergency Use Authorization (EUA). This EUA will remain  in effect (meaning this test can be used) for the duration of the  Covid-19 declaration under Section 564(b)(1) of the Act, 21  U.S.C. section 360bbb-3(b)(1), unless the authorization is  terminated or revoked. Performed at [facility], [address] [place], [place] [address]   MRSA PCR Screening     Status: None   Collection Time: 06/24/19  7:59 AM   Specimen: Nasal Mucosa; Nasopharyngeal  Result Value Ref Range Status   MRSA by PCR NEGATIVE NEGATIVE Final    Comment:        The GeneXpert MRSA Assay (FDA approved for NASAL specimens only), is one component of a comprehensive MRSA colonization surveillance program. It is not intended to diagnose MRSA infection nor to guide or monitor treatment for MRSA infections. Performed at [facility], [address] [place], [place] [address]   Culture, blood (single) w Reflex to ID Panel     Status: None (Preliminary result)   Collection Time: 06/26/19  6:47 PM   Specimen: BLOOD  Result Value Ref Range Status   Specimen Description BLOOD BLOOD LEFT HAND  Final   Special Requests   Final    BOTTLES DRAWN AEROBIC AND ANAEROBIC Blood Culture adequate volume   Culture   Final    NO GROWTH 2 DAYS Performed at [facility], [address], [place], [place] [address]    Report Status PENDING  Incomplete  SARS CORONAVIRUS 2 (TAT 6-24 HRS)  Nasopharyngeal Nasopharyngeal Swab     Status: None   Collection Time: 06/27/19  3:00 PM   Specimen: Nasopharyngeal Swab  Result Value Ref Range Status   SARS Coronavirus 2 NEGATIVE NEGATIVE Final    Comment: (NOTE) SARS-CoV-2 target nucleic acids are NOT DETECTED. The SARS-CoV-2 RNA is generally detectable in upper and lower respiratory specimens during the acute phase of infection. Negative results do not preclude SARS-CoV-2 infection, do not rule out co-infections with other pathogens, and should not be used as the sole basis for treatment or other patient management decisions. Negative results must be combined with clinical observations, patient history, and epidemiological information. The expected result is Negative. Fact Sheet for Patients: SugarRoll.be Fact Sheet for Healthcare Providers: https://www.woods-mathews.com/ This test is not yet approved or cleared by the [place] FDA and  has been authorized for detection and/or diagnosis of SARS-CoV-2 by FDA under an Emergency Use Authorization (EUA). This EUA will remain  in effect (meaning this test can be used) for the duration of the COVID-19 declaration under Section 56 4(b)(1) of the Act, 21 U.S.C. section 360bbb-3(b)(1), unless the authorization is terminated or revoked sooner. Performed at [facility], [place] [address], [place], [place] [address]      Time coordinating discharge: Over 30 minutes  SIGNED:   Ezekiel Slocumb, DO [facility] 06/28/2019, 1:25 PM   If 7PM-7AM, please contact night-coverage [email or web address]

## 2019-06-28 NOTE — Progress Notes (Signed)
Report called to facility to [facility]. EMS called by CSW. I will continue to assess.

## 2019-06-28 NOTE — NC FL2 (Signed)
[place] LEVEL OF CARE SCREENING TOOL     IDENTIFICATION  Patient Name: Robert [name] Birthdate: Feb 19, 1921 Sex: male Admission Date (Current Location): 06/21/2019  [place] and [place] Number:  Engineering geologist and Address:  [facility], [address], [place], [place] [address]      Provider Number: [id number]  Attending Physician Name and Address:  Ezekiel Slocumb, DO  Relative Name and Phone Number:  Salam Verhagen  [phone]  wife    Current Level of Care: Hospital Recommended Level of Care: [place] Prior Approval Number: [id number]  Date Approved/Denied:   PASRR Number:    Discharge Plan: SNF    Current Diagnoses: Patient Active Problem List   Diagnosis Date Noted  . Bacteremia due to Escherichia coli 06/27/2019  . Pressure injury of skin 06/25/2019  . Ureteral calculus   . Hydronephrosis with urinary obstruction due to ureteral calculus 06/21/2019  . Acute metabolic encephalopathy [id number]  . ARF (acute renal failure) ([place]) 03/27/2018  . Kidney stone 02/19/2018  . UTI (urinary tract infection) 02/19/2018  . Sepsis ([place]) 02/01/2017  . HCAP (healthcare-associated pneumonia) 02/01/2017  . Acute on chronic renal failure ([place]) 02/01/2017  . CKD (chronic kidney disease) stage 3, GFR 30-59 ml/min 11/08/2016  . HTN (hypertension) 10/03/2016  . History of CVA (cerebrovascular accident) 10/03/2016  . Pure hypercholesterolemia 10/03/2016  . Other male erectile dysfunction 06/27/2016  . Aortic atherosclerosis ([place]) 03/01/2016  . Vaccine counseling 12/30/2015    Orientation RESPIRATION BLADDER Height & Weight     Self, Place  O2 Incontinent Weight: 84.5 kg Height:  5\' 11"  (180.3 cm)  BEHAVIORAL SYMPTOMS/MOOD NEUROLOGICAL BOWEL NUTRITION STATUS      Continent    AMBULATORY STATUS COMMUNICATION OF NEEDS Skin   Extensive Assist Verbally                         Personal Care Assistance Level of  Assistance  Bathing, Dressing, Feeding Bathing Assistance: Maximum assistance Feeding assistance: Independent Dressing Assistance: Maximum assistance     Functional Limitations Info  Sight, Hearing Sight Info: Adequate(glassese) Hearing Info: Adequate(HOH)      SPECIAL CARE FACTORS FREQUENCY                       Contractures Contractures Info: Not present    Additional Factors Info  Code Status Code Status Info: Parial, no mechanical ventilation MOST FORM             Current Medications (06/28/2019):  This is the current hospital active medication list Current Facility-Administered Medications  Medication Dose Route Frequency Provider Last Rate Last Admin  . 0.9 %  sodium chloride infusion   Intravenous PRN Dhungel, Nishant, MD 10 mL/hr at 06/27/19 0633 500 mL at 06/27/19 0633  . acetaminophen (TYLENOL) tablet 650 mg  650 mg Oral Q6H PRN Agbata, Tochukwu, MD       Or  . acetaminophen (TYLENOL) suppository 650 mg  650 mg Rectal Q6H PRN Agbata, Tochukwu, MD      . albuterol (PROVENTIL) (2.5 MG/3ML) 0.083% nebulizer solution 2.5 mg  2.5 mg Nebulization Q6H PRN Dhungel, Nishant, MD   2.5 mg at 06/24/19 0839  . amLODipine (NORVASC) tablet 5 mg  5 mg Oral Daily Dhungel, Nishant, MD   5 mg at 06/28/19 1007  . aspirin EC tablet 81 mg  81 mg Oral Daily Agbata, Tochukwu, MD   81 mg at  06/28/19 1007  . atorvastatin (LIPITOR) tablet 10 mg  10 mg Oral QHS Agbata, Tochukwu, MD   10 mg at 06/27/19 2356  . cefTRIAXone (ROCEPHIN) 2 g in sodium chloride 0.9 % 100 mL IVPB  2 g Intravenous Q24H Oswald Hillock, RPH 200 mL/hr at 06/28/19 0006 2 g at 06/28/19 0006  . Chlorhexidine Gluconate Cloth 2 % PADS 6 each  6 each Topical Daily Agbata, Tochukwu, MD   6 each at 06/26/19 0902  . enoxaparin (LOVENOX) injection 40 mg  40 mg Subcutaneous Q24H Dhungel, Nishant, MD   40 mg at 06/27/19 2356  . feeding supplement (NEPRO CARB STEADY) liquid 237 mL  237 mL Oral TID WC Nicole Kindred A, DO   237  mL at 06/28/19 0800  . metroNIDAZOLE (FLAGYL) IVPB 500 mg  500 mg Intravenous Q8H Dhungel, Nishant, MD 100 mL/hr at 06/28/19 0623 500 mg at 06/28/19 0623  . multivitamin with minerals tablet 1 tablet  1 tablet Oral Daily Agbata, Tochukwu, MD   1 tablet at 06/28/19 1007  . omega-3 acid ethyl esters (LOVAZA) capsule 2 g  2 g Oral Daily Agbata, Tochukwu, MD   2 g at 06/28/19 1007  . ondansetron (ZOFRAN) tablet 4 mg  4 mg Oral Q6H PRN Agbata, Tochukwu, MD       Or  . ondansetron (ZOFRAN) injection 4 mg  4 mg Intravenous Q6H PRN Agbata, Tochukwu, MD   4 mg at 06/21/19 1232  . senna (SENOKOT) tablet 8.6 mg  1 tablet Oral BID Agbata, Tochukwu, MD   8.6 mg at 06/28/19 1007  . sodium chloride flush (NS) 0.9 % injection 3 mL  3 mL Intravenous Q12H Agbata, Tochukwu, MD   3 mL at 06/28/19 1017  . tamsulosin (FLOMAX) capsule 0.4 mg  0.4 mg Oral Daily Agbata, Tochukwu, MD   0.4 mg at 06/28/19 1007     Discharge Medications: Please see discharge summary for a list of discharge medications.  Relevant Imaging Results:  Relevant Lab Results:   Additional Information SSN [id number]  Victorino Dike, RN

## 2019-06-28 NOTE — Care Management Important Message (Signed)
Important Message  Patient Details  Name: Robert [name] MRN: [id number] Date of Birth: [birth year]   Medicare Important Message Given:  Yes     Dannette Barbara 06/28/2019, 12:56 PM

## 2019-06-28 NOTE — Evaluation (Signed)
Objective Swallowing Evaluation: Type of Study: MBS-Modified Barium Swallow Study   Patient Details  Name: Robert [name] MRN: [id number] Date of Birth: November 09, 1920  Today's Date: 06/28/2019 Time: SLP Start Time (ACUTE ONLY): 1400 -SLP Stop Time (ACUTE ONLY): 1455  SLP Time Calculation (min) (ACUTE ONLY): 55 min   Past Medical History:  Past Medical History:  Diagnosis Date  . Aortic atherosclerosis ([place]) 03/01/2016  . Arthritis   . 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 High cholesterol   . History of kidney stones   . Hypertension   . TIA (transient ischemic attack)   . 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 CYSTOSCOPY W/ RETROGRADES Left 03/23/2018   Procedure: CYSTOSCOPY WITH RETROGRADE PYELOGRAM;  Surgeon: Billey Co, MD;  Location: [facility];  Service: Urology;  Laterality: Left;  . CYSTOSCOPY W/ RETROGRADES Left 06/21/2019   Procedure: CYSTOSCOPY WITH RETROGRADE PYELOGRAM;  Surgeon: [name], MD;  Location: [facility];  Service: Urology;  Laterality: Left;  . CYSTOSCOPY WITH STENT PLACEMENT Left 02/20/2018   Procedure: CYSTOSCOPY WITH STENT PLACEMENT;  Surgeon: Billey Co, MD;  Location: [facility];  Service: Urology;  Laterality: Left;  . CYSTOSCOPY WITH STENT PLACEMENT Left 06/21/2019   Procedure: CYSTOSCOPY WITH STENT PLACEMENT;  Surgeon: [name], MD;  Location: [facility];  Service: Urology;  Laterality: Left;  . CYSTOSCOPY/URETEROSCOPY/HOLMIUM LASER/STENT PLACEMENT Left 03/23/2018   Procedure: CYSTOSCOPY/URETEROSCOPY/HOLMIUM LASER/STENT Exchange;  Surgeon: Billey Co, MD;  Location: [facility];  Service: Urology;  Laterality: Left;  left stent exchange  . LEG SURGERY     broken tib/fib  . TOTAL HIP ARTHROPLASTY Bilateral    HPI: Pt is a 86 y.o. 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   Patient was brought in this morning for evaluation of mental status changes which the wife noted 1 day prior to his admission.  She states that patient complained of not feeling well and had nausea but no vomiting.  He has a history of low back pain but the wife states that the pain got worse the day prior to his admission and they thought it was exacerbated from physical therapy that he had earlier in the day.  At baseline patient is normally awake, alert and oriented x 2 but on admission he is only oriented to person.  Labs revealed marked leukocytosis, 20K with a left shift, he has pyuria and lactate is elevated at 2.9.  CT scan renal study showed 4 mm calculus adjacent to the left ureterovesicular junction with very mild proximal left hydroureteronephrosis indicative of mild obstruction.  Chest x-ray does not show any acute findings.  Pt admitted w/ dx of metabolic encephaloapathy; CT showed mild left-sided hydronephrosis from an obstructing stone.  Unsure of pt's baseline Cognitive status - noted decreased orientation per Wife's report.    Subjective: pt awakened easily to verbal stim; HOH w/out HAs - they are charging currently. Sat in front of pt and increased volume during conversation.     Assessment / Plan / Recommendation  CHL IP CLINICAL IMPRESSIONS 06/28/2019  Clinical Impression The patient is presenting with moderate severe oropharyngeal dysphagia characterized by abnormal oral hold/prolonger A-P transfer (>10 seconds most trials), delayed pharyngeal swallow initiation, trace silent laryngeal  penetration with residue above the vocal cords), > 50% pharyngeal residue of solids and <  50% of nectar-thick liquid, reduced tongue base retraction, incomplete epiglottic inversion.  The laryngeal penetration appeared to be the thin liquid washing pharyngeal residue into the vestibule.  While the patient does not appear to be at significant risk for prandial aspiration, he does present risk of aspiration  of pharyngeal residue after meals.  The patient is at significant risk for inadequate oral intake given the extent of oral holding and amount of pharyngeal residue.  Recommend continuing dysphagia 3 diet with nectar-thick liquids, following previously recommendations. Dysphagia level 3 diet w/ moistened foods; NECTAR consistency liquids VIA CUP - NO STRAWS. Aspiration precautions. Pills in Puree for safer swallowing. Supervision at meals for follow through w/ aspiration precautions including Small sips, Slowly AND Sitting UPRIGHT w/ Head Forward w/ all oral intake.  Recommend that the patient and his wife discuss with his medical team the relative risks of thin liquid given the results of this study.   SLP Visit Diagnosis Dysphagia, oropharyngeal phase (R13.12);Dysphagia, pharyngeal phase (R13.13)  Attention and concentration deficit following --  Frontal lobe and executive function deficit following --  Impact on safety and function Mild aspiration risk;Risk for inadequate nutrition/hydration      CHL IP TREATMENT RECOMMENDATION 06/28/2019  Treatment Recommendations Therapy as outlined in treatment plan below     Prognosis 06/28/2019  Prognosis for Safe Diet Advancement Fair  Barriers to Reach Goals Cognitive deficits;Time post onset;Severity of deficits;Behavior  Barriers/Prognosis Comment oral holding    CHL IP DIET RECOMMENDATION 06/28/2019  SLP Diet Recommendations Dysphagia 3 (Mech soft) solids;Nectar thick liquid  Liquid Administration via Cup  Medication Administration Whole meds with puree  Compensations Minimize environmental distractions;Slow rate;Small sips/bites;Lingual sweep for clearance of pocketing;Multiple dry swallows after each bite/sip;Follow solids with liquid  Postural Changes Remain semi-upright after after feeds/meals (Comment);Seated upright at 90 degrees      CHL IP OTHER RECOMMENDATIONS 06/28/2019  Recommended Consults --  Oral Care Recommendations Oral care  BID;Staff/trained caregiver to provide oral care  Other Recommendations --      CHL IP FOLLOW UP RECOMMENDATIONS 06/24/2019  Follow up Recommendations (No Data)      CHL IP FREQUENCY AND DURATION 06/22/2019  Speech Therapy Frequency (ACUTE ONLY) min 3x week  Treatment Duration 2 weeks           CHL IP ORAL PHASE 06/28/2019  Oral Phase Impaired  Oral - Pudding Teaspoon --  Oral - Pudding Cup --  Oral - Honey Teaspoon --  Oral - Honey Cup --  Oral - Nectar Teaspoon --  Oral - Nectar Cup --  Oral - Nectar Straw --  Oral - Thin Teaspoon --  Oral - Thin Cup --  Oral - Thin Straw --  Oral - Puree --  Oral - Mech Soft --  Oral - Regular --  Oral - Multi-Consistency --  Oral - Pill --  Oral Phase - Comment Oral hold > 10seconds    CHL IP PHARYNGEAL PHASE 06/28/2019  Pharyngeal Phase Impaired  Pharyngeal- Pudding Teaspoon --  Pharyngeal --  Pharyngeal- Pudding Cup --  Pharyngeal --  Pharyngeal- Honey Teaspoon --  Pharyngeal --  Pharyngeal- Honey Cup --  Pharyngeal --  Pharyngeal- Nectar Teaspoon --  Pharyngeal --  Pharyngeal- Nectar Cup --  Pharyngeal --  Pharyngeal- Nectar Straw --  Pharyngeal --  Pharyngeal- Thin Teaspoon --  Pharyngeal --  Pharyngeal- Thin Cup --  Pharyngeal --  Pharyngeal- Thin Straw --  Pharyngeal --  Pharyngeal- Puree --  Pharyngeal --  Pharyngeal-  Mechanical Soft --  Pharyngeal --  Pharyngeal- Regular --  Pharyngeal --  Pharyngeal- Multi-consistency --  Pharyngeal --  Pharyngeal- Pill --  Pharyngeal --  Pharyngeal Comment Pharyngeal residue > 50%     No flowsheet data found.  Leroy Sea, MS/CCC- SLP  Lou Miner 06/28/2019, 2:56 PM

## 2019-07-01 DIAGNOSIS — I10 Essential (primary) hypertension: Secondary | ICD-10-CM

## 2019-07-01 DIAGNOSIS — G3184 Mild cognitive impairment, so stated: Secondary | ICD-10-CM

## 2019-07-01 DIAGNOSIS — A4151 Sepsis due to Escherichia coli [E. coli]: Secondary | ICD-10-CM | POA: Diagnosis not present

## 2019-07-01 DIAGNOSIS — J690 Pneumonitis due to inhalation of food and vomit: Secondary | ICD-10-CM | POA: Diagnosis not present

## 2019-07-01 DIAGNOSIS — N201 Calculus of ureter: Secondary | ICD-10-CM | POA: Diagnosis not present

## 2019-07-01 LAB — CULTURE, BLOOD (SINGLE)
Culture: NO GROWTH
Special Requests: ADEQUATE

## 2019-07-04 ENCOUNTER — Telehealth: Payer: Self-pay | Admitting: Radiology

## 2019-07-04 NOTE — Telephone Encounter (Signed)
-----   Message from [name], MD sent at 07/03/2019  3:55 PM EDT ----- Regarding: RE: hospital f/u vs surgery He needs to be scheduled for ureteroscopy.  I will get you the scheduling sheet ----- Message ----- From: Ranell Patrick, RN Sent: 07/02/2019   4:08 PM EDT To: [name], MD Subject: hospital f/u vs surgery                        [facility] called to schedule a follow up appointment for this patient who had a stent placed on 3/5. Does he need to be seen in clinic or just schedule surgery?  Thanks,  Mika Griffitts

## 2019-07-04 NOTE — Telephone Encounter (Signed)
Notified [facility] at [facility] that patient does not need an office visit but will call back with surgery information. Men[facility] voiced understanding.

## 2019-07-08 ENCOUNTER — Other Ambulatory Visit: Payer: Self-pay | Admitting: Radiology

## 2019-07-08 DIAGNOSIS — N201 Calculus of ureter: Secondary | ICD-10-CM

## 2019-07-09 ENCOUNTER — Other Ambulatory Visit: Payer: Self-pay

## 2019-07-09 ENCOUNTER — Other Ambulatory Visit: Payer: Self-pay | Admitting: Radiology

## 2019-07-09 ENCOUNTER — Other Ambulatory Visit: Payer: TRICARE For Life (TFL)

## 2019-07-09 DIAGNOSIS — N201 Calculus of ureter: Secondary | ICD-10-CM

## 2019-07-11 ENCOUNTER — Encounter
Admission: RE | Admit: 2019-07-11 | Discharge: 2019-07-11 | Disposition: A | Discharge status: Home / Self Care | Payer: Medicare PPO | Source: Ambulatory Visit | Attending: Urology | Admitting: Urology

## 2019-07-11 ENCOUNTER — Other Ambulatory Visit: Payer: Self-pay

## 2019-07-11 NOTE — Patient Instructions (Signed)
Your procedure is scheduled on:  Tues 3/30 Report to [place] To find out your arrival time please call [phone] between [place] on Mon. 3/29  Remember: Instructions that are not followed completely may result in serious medical risk,  up to and including death, or upon the discretion of your surgeon and anesthesiologist your  surgery may need to be rescheduled.     _X__ 1. Do not eat food after midnight the night before your procedure.                 No gum chewing or hard candies. You may drink clear liquids up to 2 hours                 before you are scheduled to arrive for your surgery- DO not drink clear                 liquids within 2 hours of the start of your surgery.                 Clear Liquids include:  water, apple juice without pulp, clear Gatorade, G2 or                  Gatorade Zero (avoid Red/Purple/Blue), Black Coffee or Tea (Do not add                 anything to coffee or tea). _____2.   Complete the carbohydrate drink provided to you, 2 hours before arrival.  __X__2.  On the morning of surgery brush your teeth with toothpaste and water, you                may rinse your mouth with mouthwash if you wish.  Do not swallow any toothpaste of mouthwash.     _X__ 3.  No Alcohol for 24 hours before or after surgery.   ___ 4.  Do Not Smoke or use e-cigarettes For 24 Hours Prior to Your Surgery.                 Do not use any chewable tobacco products for at least 6 hours prior to                 surgery.  ____  5.  Bring all medications with you on the day of surgery if instructed.   __x__  6.  Notify your doctor if there is any change in your medical condition      (cold, fever, infections).     Do not wear jewelry. Do not wear lotions,. You may wear deodorant. Do not shave 48 hours prior to surgery. Men may shave face and neck. Do not bring valuables to the hospital.    [facility] is not responsible for any belongings or  valuables.  Contacts, dentures or bridgework may not be worn into surgery. Leave your suitcase in the car. After surgery it may be brought to your room. For patients admitted to the hospital, discharge time is determined by your treatment team.   Patients discharged the day of surgery will not be allowed to drive home.   Make arrangements for someone to be with you for the first 24 hours of your Same Day Discharge.    Please read over the following fact sheets that you were given:     __x__ Take these medicines the morning of surgery with A SIP OF WATER:    1. none  2.   3.  4.  5.  6.  ____ Fleet Enema (as directed)   ____ Use CHG Soap (or wipes) as directed  ____ Use Benzoyl Peroxide Gel as instructed  ____ Use inhalers on the day of surgery  ____ Stop metformin 2 days prior to surgery    ____ Take 1/2 of usual insulin dose the night before surgery. No insulin the morning          of surgery.   __x__ Stopped aspirin already  __x__ Stop Anti-inflammatories  No ibuprofen or aleve until after surgery.  Tylenol is OK   ____ Stop supplements until after surgery.    ____ Bring C-Pap to the hospital.

## 2019-07-12 ENCOUNTER — Other Ambulatory Visit
Admission: RE | Admit: 2019-07-12 | Discharge: 2019-07-12 | Disposition: A | Discharge status: Home / Self Care | Payer: Medicare PPO | Source: Ambulatory Visit | Attending: Urology | Admitting: Urology

## 2019-07-12 DIAGNOSIS — Z01812 Encounter for preprocedural laboratory examination: Secondary | ICD-10-CM | POA: Insufficient documentation

## 2019-07-12 DIAGNOSIS — Z20822 Contact with and (suspected) exposure to covid-19: Secondary | ICD-10-CM | POA: Diagnosis not present

## 2019-07-12 LAB — SARS CORONAVIRUS 2 (TAT 6-24 HRS): SARS Coronavirus 2: NEGATIVE

## 2019-07-13 LAB — CULTURE, URINE COMPREHENSIVE

## 2019-07-16 ENCOUNTER — Ambulatory Visit: Payer: Medicare PPO | Admitting: Anesthesiology

## 2019-07-16 ENCOUNTER — Ambulatory Visit
Admission: RE | Admit: 2019-07-16 | Discharge: 2019-07-16 | Disposition: A | Discharge status: Home / Self Care | Payer: Medicare PPO | Attending: Urology | Admitting: Urology

## 2019-07-16 ENCOUNTER — Ambulatory Visit: Payer: Medicare PPO

## 2019-07-16 ENCOUNTER — Encounter: Payer: Self-pay | Admitting: Urology

## 2019-07-16 ENCOUNTER — Encounter
Admission: RE | Disposition: A | Discharge status: Home / Self Care | Payer: Self-pay | Source: Home / Self Care | Attending: Urology

## 2019-07-16 ENCOUNTER — Other Ambulatory Visit: Payer: Self-pay

## 2019-07-16 DIAGNOSIS — I700 Atherosclerosis of aorta: Secondary | ICD-10-CM | POA: Diagnosis not present

## 2019-07-16 DIAGNOSIS — M1990 Unspecified osteoarthritis, unspecified site: Secondary | ICD-10-CM | POA: Diagnosis not present

## 2019-07-16 DIAGNOSIS — Z79899 Other long term (current) drug therapy: Secondary | ICD-10-CM | POA: Diagnosis not present

## 2019-07-16 DIAGNOSIS — Z96643 Presence of artificial hip joint, bilateral: Secondary | ICD-10-CM | POA: Insufficient documentation

## 2019-07-16 DIAGNOSIS — N202 Calculus of kidney with calculus of ureter: Secondary | ICD-10-CM | POA: Diagnosis present

## 2019-07-16 DIAGNOSIS — Z885 Allergy status to narcotic agent status: Secondary | ICD-10-CM | POA: Insufficient documentation

## 2019-07-16 DIAGNOSIS — Z7982 Long term (current) use of aspirin: Secondary | ICD-10-CM | POA: Insufficient documentation

## 2019-07-16 DIAGNOSIS — Z87891 Personal history of nicotine dependence: Secondary | ICD-10-CM | POA: Diagnosis not present

## 2019-07-16 DIAGNOSIS — Z882 Allergy status to sulfonamides status: Secondary | ICD-10-CM | POA: Diagnosis not present

## 2019-07-16 DIAGNOSIS — N201 Calculus of ureter: Secondary | ICD-10-CM

## 2019-07-16 DIAGNOSIS — H5461 Unqualified visual loss, right eye, normal vision left eye: Secondary | ICD-10-CM | POA: Insufficient documentation

## 2019-07-16 DIAGNOSIS — E7800 Pure hypercholesterolemia, unspecified: Secondary | ICD-10-CM | POA: Insufficient documentation

## 2019-07-16 DIAGNOSIS — Z87442 Personal history of urinary calculi: Secondary | ICD-10-CM | POA: Diagnosis not present

## 2019-07-16 DIAGNOSIS — I10 Essential (primary) hypertension: Secondary | ICD-10-CM | POA: Diagnosis not present

## 2019-07-16 DIAGNOSIS — I69998 Other sequelae following unspecified cerebrovascular disease: Secondary | ICD-10-CM | POA: Insufficient documentation

## 2019-07-16 DIAGNOSIS — N200 Calculus of kidney: Secondary | ICD-10-CM

## 2019-07-16 HISTORY — PX: CYSTOSCOPY/URETEROSCOPY/HOLMIUM LASER/STENT PLACEMENT: SHX6546

## 2019-07-16 SURGERY — CYSTOSCOPY/URETEROSCOPY/HOLMIUM LASER/STENT PLACEMENT
Anesthesia: General | Laterality: Left

## 2019-07-16 MED ORDER — PROPOFOL 10 MG/ML IV BOLUS
INTRAVENOUS | Status: DC | PRN
  Administered 2019-07-16: 100 mg via INTRAVENOUS

## 2019-07-16 MED ORDER — FENTANYL CITRATE (PF) 100 MCG/2ML IJ SOLN
INTRAMUSCULAR | Status: AC
  Filled 2019-07-16: qty 2

## 2019-07-16 MED ORDER — DEXAMETHASONE SODIUM PHOSPHATE 10 MG/ML IJ SOLN
INTRAMUSCULAR | Status: AC
  Filled 2019-07-16: qty 1

## 2019-07-16 MED ORDER — FAMOTIDINE 20 MG PO TABS: 20.0000 mg | ORAL_TABLET | Freq: Once | ORAL | Status: AC

## 2019-07-16 MED ORDER — SUCCINYLCHOLINE CHLORIDE 20 MG/ML IJ SOLN
INTRAMUSCULAR | Status: DC | PRN
  Administered 2019-07-16: 80 mg via INTRAVENOUS

## 2019-07-16 MED ORDER — FAMOTIDINE 20 MG PO TABS
ORAL_TABLET | ORAL | Status: AC
  Administered 2019-07-16: 20 mg via ORAL
  Filled 2019-07-16: qty 1

## 2019-07-16 MED ORDER — SUGAMMADEX SODIUM 200 MG/2ML IV SOLN
INTRAVENOUS | Status: DC | PRN
  Administered 2019-07-16: 161.4 mg via INTRAVENOUS

## 2019-07-16 MED ORDER — EPHEDRINE SULFATE 50 MG/ML IJ SOLN
INTRAMUSCULAR | Status: DC | PRN
  Administered 2019-07-16: 5 mg via INTRAVENOUS
  Administered 2019-07-16: 10 mg via INTRAVENOUS

## 2019-07-16 MED ORDER — PHENYLEPHRINE HCL (PRESSORS) 10 MG/ML IV SOLN
INTRAVENOUS | Status: DC | PRN
  Administered 2019-07-16: 50 ug via INTRAVENOUS

## 2019-07-16 MED ORDER — ONDANSETRON HCL 4 MG/2ML IJ SOLN
INTRAMUSCULAR | Status: AC
  Filled 2019-07-16: qty 2

## 2019-07-16 MED ORDER — FENTANYL CITRATE (PF) 100 MCG/2ML IJ SOLN
INTRAMUSCULAR | Status: DC | PRN
  Administered 2019-07-16: 50 ug via INTRAVENOUS

## 2019-07-16 MED ORDER — IOHEXOL 180 MG/ML  SOLN
INTRAMUSCULAR | Status: DC | PRN
  Administered 2019-07-16: 20 mL

## 2019-07-16 MED ORDER — PHENYLEPHRINE HCL-NACL 10-0.9 MG/250ML-% IV SOLN
INTRAVENOUS | Status: DC | PRN
  Administered 2019-07-16: 20 ug/min via INTRAVENOUS

## 2019-07-16 MED ORDER — SUCCINYLCHOLINE CHLORIDE 200 MG/10ML IV SOSY
PREFILLED_SYRINGE | INTRAVENOUS | Status: AC
  Filled 2019-07-16: qty 10

## 2019-07-16 MED ORDER — LIDOCAINE HCL (CARDIAC) PF 100 MG/5ML IV SOSY
PREFILLED_SYRINGE | INTRAVENOUS | Status: DC | PRN
  Administered 2019-07-16: 100 mg via INTRAVENOUS

## 2019-07-16 MED ORDER — ONDANSETRON HCL 4 MG/2ML IJ SOLN
INTRAMUSCULAR | Status: DC | PRN
  Administered 2019-07-16: 4 mg via INTRAVENOUS

## 2019-07-16 MED ORDER — PROPOFOL 10 MG/ML IV BOLUS
INTRAVENOUS | Status: AC
  Filled 2019-07-16: qty 40

## 2019-07-16 MED ORDER — DEXAMETHASONE SODIUM PHOSPHATE 10 MG/ML IJ SOLN
INTRAMUSCULAR | Status: DC | PRN
  Administered 2019-07-16: 8 mg via INTRAVENOUS

## 2019-07-16 MED ORDER — FENTANYL CITRATE (PF) 100 MCG/2ML IJ SOLN: 25.0000 ug | INTRAMUSCULAR | Status: DC | PRN

## 2019-07-16 MED ORDER — CEFAZOLIN SODIUM-DEXTROSE 2-4 GM/100ML-% IV SOLN
INTRAVENOUS | Status: AC
  Filled 2019-07-16: qty 100

## 2019-07-16 MED ORDER — SEVOFLURANE IN SOLN
RESPIRATORY_TRACT | Status: AC
  Filled 2019-07-16: qty 250

## 2019-07-16 MED ORDER — PHENYLEPHRINE HCL (PRESSORS) 10 MG/ML IV SOLN
INTRAVENOUS | Status: AC
  Filled 2019-07-16: qty 1

## 2019-07-16 MED ORDER — CEFAZOLIN SODIUM-DEXTROSE 2-4 GM/100ML-% IV SOLN
2.0000 g | INTRAVENOUS | Status: AC
  Administered 2019-07-16: 2 g via INTRAVENOUS

## 2019-07-16 MED ORDER — EPHEDRINE 5 MG/ML INJ
INTRAVENOUS | Status: AC
  Filled 2019-07-16: qty 10

## 2019-07-16 MED ORDER — ROCURONIUM BROMIDE 100 MG/10ML IV SOLN
INTRAVENOUS | Status: DC | PRN
  Administered 2019-07-16: 10 mg via INTRAVENOUS
  Administered 2019-07-16: 40 mg via INTRAVENOUS

## 2019-07-16 MED ORDER — LACTATED RINGERS IV SOLN: INTRAVENOUS | Status: DC

## 2019-07-16 MED ORDER — ROCURONIUM BROMIDE 10 MG/ML (PF) SYRINGE
PREFILLED_SYRINGE | INTRAVENOUS | Status: AC
  Filled 2019-07-16: qty 10

## 2019-07-16 SURGICAL SUPPLY — 29 items
BAG DRAIN CYSTO-URO LG1000N (MISCELLANEOUS) ×3 IMPLANT
BASKET ZERO TIP 1.9FR (BASKET) ×3 IMPLANT
BRUSH SCRUB EZ 1% IODOPHOR (MISCELLANEOUS) ×3 IMPLANT
CATH URETL 5X70 OPEN END (CATHETERS) IMPLANT
CNTNR SPEC 2.5X3XGRAD LEK (MISCELLANEOUS) ×1
CONT SPEC 4OZ STER OR WHT (MISCELLANEOUS) ×2
CONTAINER SPEC 2.5X3XGRAD LEK (MISCELLANEOUS) ×1 IMPLANT
DRAPE UTILITY 15X26 TOWEL STRL (DRAPES) ×3 IMPLANT
DRSG TEGADERM 2-3/8X2-3/4 SM (GAUZE/BANDAGES/DRESSINGS) ×3 IMPLANT
FIBER LASER TRACTIP 200 (UROLOGICAL SUPPLIES) ×3 IMPLANT
GLOVE BIO SURGEON STRL SZ8 (GLOVE) ×3 IMPLANT
GOWN STRL REUS W/ TWL LRG LVL3 (GOWN DISPOSABLE) ×1 IMPLANT
GOWN STRL REUS W/ TWL XL LVL3 (GOWN DISPOSABLE) ×1 IMPLANT
GOWN STRL REUS W/TWL LRG LVL3 (GOWN DISPOSABLE) ×2
GOWN STRL REUS W/TWL XL LVL3 (GOWN DISPOSABLE) ×2
GUIDEWIRE STR DUAL SENSOR (WIRE) ×3 IMPLANT
INFUSOR MANOMETER BAG 3000ML (MISCELLANEOUS) ×3 IMPLANT
INTRODUCER DILATOR DOUBLE (INTRODUCER) IMPLANT
KIT TURNOVER CYSTO (KITS) ×3 IMPLANT
PACK CYSTO AR (MISCELLANEOUS) ×3 IMPLANT
SET CYSTO W/LG BORE CLAMP LF (SET/KITS/TRAYS/PACK) ×3 IMPLANT
SHEATH URETERAL 12FRX35CM (MISCELLANEOUS) IMPLANT
SOL .9 NS 3000ML IRR  AL (IV SOLUTION) ×2
SOL .9 NS 3000ML IRR UROMATIC (IV SOLUTION) ×1 IMPLANT
STENT URET 6FRX24 CONTOUR (STENTS) IMPLANT
STENT URET 6FRX26 CONTOUR (STENTS) ×3 IMPLANT
SURGILUBE 2OZ TUBE FLIPTOP (MISCELLANEOUS) ×3 IMPLANT
VALVE UROSEAL ADJ ENDO (VALVE) ×3 IMPLANT
WATER STERILE IRR 1000ML POUR (IV SOLUTION) ×3 IMPLANT

## 2019-07-16 NOTE — Discharge Instructions (Signed)
DISCHARGE INSTRUCTIONS FOR KIDNEY STONE/URETERAL STENT   MEDICATIONS:  1. Resume all your other meds from home.  2.  AZO (over-the-counter) can help with the burning/stinging when you urinate.   ACTIVITY:  1. May resume regular activities in 24 hours. 2. No driving while on narcotic pain medications  3. Drink plenty of water  4. Continue to walk at home - you can still get blood clots when you are at home, so keep active, but don't over do it.  5. May return to work/school tomorrow or when you feel ready   BATHING:  1. You can shower. 2. You have a string coming from your urethra: The stent string is attached to your ureteral stent. Do not pull on this.   SIGNS/SYMPTOMS TO CALL:  Please call us if you have a fever greater than 101.5, uncontrolled nausea/vomiting, uncontrolled pain, dizziness, unable to urinate, bloody urine, chest pain, shortness of breath, leg swelling, leg pain, or any other concerns or questions.   You can reach [place] at [phone].   FOLLOW-UP:  1. You have a follow-up appointment scheduled  2. You have a string attached to your stent, you may remove it on Thursday, 07/18/2019. To do this, pull the string until the stent is completely removed. You may feel an odd sensation in your back.  AMBULATORY SURGERY  DISCHARGE INSTRUCTIONS   1) The drugs that you were given will stay in your system until tomorrow so for the next 24 hours you should not:  A) Drive an automobile B) Make any legal decisions C) Drink any alcoholic beverage   2) You may resume regular meals tomorrow.  Today it is better to start with liquids and gradually work up to solid foods.  You may eat anything you prefer, but it is better to start with liquids, then soup and crackers, and gradually work up to solid foods.   3) Please notify your doctor immediately if you have any unusual bleeding, trouble breathing, redness and pain at the surgery site, drainage, fever, or pain not relieved by  medication.    4) Additional Instructions:        Please contact your physician with any problems or Same Day Surgery at [phone], Monday through Friday 6 am to 4 pm, or [place] at [facility] number at [phone].

## 2019-07-16 NOTE — Anesthesia Preprocedure Evaluation (Addendum)
Anesthesia Evaluation  Patient identified by MRN, date of birth, ID band Patient awake and Patient confused    Reviewed: Allergy & Precautions, NPO status , Patient's Chart, lab work & pertinent test results  History of Anesthesia Complications Negative for: history of anesthetic complications  Airway Mallampati: III  TM Distance: <3 FB Neck ROM: limited    Dental  (+) Poor Dentition, Chipped   Pulmonary pneumonia, former smoker,           Cardiovascular hypertension, Pt. on medications (-) Past MI and (-) CHF (-) dysrhythmias (-) Valvular Problems/Murmurs     Neuro/Psych neg Seizures TIA (lost vision in R eye, still not present)   GI/Hepatic Neg liver ROS, neg GERD  ,  Endo/Other  neg diabetes  Renal/GU Renal disease     Musculoskeletal  (+) Arthritis ,   Abdominal   Peds  Hematology   Anesthesia Other Findings Past Medical History: 03/01/2016: Aortic atherosclerosis ([facility]) No date: Arthritis No date: Dengue fever     Comment:  in the service No date: High cholesterol     Comment:  no meds No date: History of kidney stones No date: Hypertension     Comment:  no meds No date: TIA (transient ischemic attack)     Comment:  residual right eye weakness and swallowing No date: Typhus fever     Comment:  in the service  Past Surgical History: 03/23/2018: CYSTOSCOPY W/ RETROGRADES; Left     Comment:  Procedure: CYSTOSCOPY WITH RETROGRADE PYELOGRAM;                Surgeon: Billey Co, MD;  Location: [facility] ORS;                Service: Urology;  Laterality: Left; 06/21/2019: CYSTOSCOPY W/ RETROGRADES; Left     Comment:  Procedure: CYSTOSCOPY WITH RETROGRADE PYELOGRAM;                Surgeon: [name], MD;  Location: [facility];                Service: Urology;  Laterality: Left; 02/20/2018: CYSTOSCOPY WITH STENT PLACEMENT; Left     Comment:  Procedure: CYSTOSCOPY WITH STENT PLACEMENT;  Surgeon:                Billey Co, MD;  Location: [facility] OR[facility];  Service:               Urology;  Laterality: Left; 06/21/2019: CYSTOSCOPY WITH STENT PLACEMENT; Left     Comment:  Procedure: CYSTOSCOPY WITH STENT PLACEMENT;  Surgeon:               [name], MD;  Location: [facility] OR[facility];  Service:               Urology;  Laterality: Left; 03/23/2018: CYSTOSCOPY/URETEROSCOPY/HOLMIUM LASER/STENT PLACEMENT; Left     Comment:  Procedure: CYSTOSCOPY/URETEROSCOPY/HOLMIUM LASER/STENT               Exchange;  Surgeon: Billey Co, MD;  Location: [facility]              ORS;  Service: Urology;  Laterality: Left;  left stent               exchange No date: LEG SURGERY     Comment:  broken tib/fib No date: TOTAL HIP ARTHROPLASTY; Bilateral  BMI    Body Mass Index: 24.81 kg/m       Reproductive/Obstetrics  Anesthesia Physical  Anesthesia Plan  ASA: III  Anesthesia Plan: General   Post-op Pain Management:    Induction: Intravenous  PONV Risk Score and Plan: 2 and Ondansetron, Dexamethasone and Treatment may vary due to age or medical condition  Airway Management Planned: Oral ETT  Additional Equipment:   Intra-op Plan:   Post-operative Plan: Extubation in OR  Informed Consent: I have reviewed the patients History and Physical, chart, labs and discussed the procedure including the risks, benefits and alternatives for the proposed anesthesia with the patient or authorized representative who has indicated his/her understanding and acceptance.     Dental Advisory Given  Plan Discussed with: Anesthesiologist, CRNA and Surgeon  Anesthesia Plan Comments: (Patient and POA consented for risks of anesthesia including but not limited to:  - adverse reactions to medications - damage to teeth, lips or other oral mucosa - sore throat or hoarseness - Damage to heart, brain, lungs or loss of life  They voiced understanding.)        Anesthesia Quick  Evaluation

## 2019-07-16 NOTE — Anesthesia Postprocedure Evaluation (Signed)
Anesthesia Post Note  Patient: Robert [name]  Procedure(s) Performed: CYSTOSCOPY/URETEROSCOPY/HOLMIUM LASER/STENT exchange (Left )  Patient location during evaluation: PACU Anesthesia Type: General Level of consciousness: awake and alert Pain management: pain level controlled Vital Signs Assessment: post-procedure vital signs reviewed and stable Respiratory status: spontaneous breathing, nonlabored ventilation, respiratory function stable and patient connected to nasal cannula oxygen Cardiovascular status: blood pressure returned to baseline and stable Postop Assessment: no apparent nausea or vomiting Anesthetic complications: no     Last Vitals:  Vitals:   07/16/19 1345 07/16/19 1409  BP: (!) 158/77 (!) 150/66  Pulse: 74   Resp: 16   Temp: 36.4 C   SpO2: 94%     Last Pain:  Vitals:   07/16/19 1345  TempSrc: Temporal  PainSc: 0-No pain                 Arita Miss

## 2019-07-16 NOTE — Interval H&P Note (Signed)
History and Physical Interval Note:  Status post stent placement 06/21/2019 for an obstructing 4 mm distal ureteral calculus with sepsis.  He presents today for definitive stone management with left ureteroscopic stone removal.  The indications and nature of the planned procedure were discussed as well as the potential  benefits and expected outcome.  Alternatives have been discussed in detail. The most common complications and side effects were discussed including but not limited to infection/sepsis; blood loss; damage to urethra, bladder, ureter; need for multiple surgeries; need for prolonged stent placement as well as general anesthesia risks. All of his questions were answered and he desires to proceed.  At the time of our visit he she did not appear to be distracted or in pain.   07/16/2019 11:22 AM  Robert [name]  has presented today for surgery, with the diagnosis of left ureteral calculus.  The various methods of treatment have been discussed with the patient and family. After consideration of risks, benefits and other options for treatment, the patient has consented to  Procedure(s): CYSTOSCOPY/URETEROSCOPY/HOLMIUM LASER/STENT exchange (Left) as a surgical intervention.  The patient's history has been reviewed, patient examined, no change in status, stable for surgery.  I have reviewed the patient's chart and labs.  Questions were answered to the patient's satisfaction.     [place]

## 2019-07-16 NOTE — Op Note (Signed)
Preoperative diagnosis:  1.  Left distal ureteral calculus 2.  Left nephrolithiasis  Postoperative diagnosis:  1.  Left distal ureteral calculi x2 2.  Left nephrolithiasis  Procedure:  Cystoscopy Left ureteroscopy and stone removal x3 Ureteroscopic laser lithotripsy Left ureteral stent exchange (6FR) 26 cm Left retrograde pyelography with interpretation  Surgeon: Nicki Reaper C. Trinadee Verhagen, M.D.  Anesthesia: General  Complications: None  Intraoperative findings:  1.  Left retrograde pyelography post procedure showed no filling defects, stone fragments or contrast extravasation  EBL: Minimal  Specimens: Calculus fragments for analysis   Indication: Robert [name] is a 86 y.o. male who underwent urgent placement of a left ureteral stent for a 4 mm obstructing left distal ureteral calculus with sepsis from a urinary source on 06/21/2019.  He presents today for definitive stone treatment.  He also has a nonobstructing left renal calculus.  Preoperative urine culture was negative.  After reviewing the management options for treatment, the patient elected to proceed with the above surgical procedure(s). We have discussed the potential benefits and risks of the procedure, side effects of the proposed treatment, the likelihood of the patient achieving the goals of the procedure, and any potential problems that might occur during the procedure or recuperation. Informed consent has been obtained.  Description of procedure:  The patient was taken to the operating room and general anesthesia was induced.  The patient was placed in the dorsal lithotomy position, prepped and draped in the usual sterile fashion, and preoperative antibiotics were administered. A preoperative time-out was performed.   A 21 French cystoscope was lubricated and passed under direct vision.  The urethra was normal in caliber with the exception of a wide caliber bulbar stricture which did not impede passage of the cystoscope.  The  prostate demonstrated moderate lateral lobe enlargement.  Panendoscopy was performed and the bladder mucosa showed no erythema, solid or papillary lesions with the exception of inflammatory changes at the left hemitrigone secondary to the indwelling stent.  The indwelling stent was grasped with endoscopic forceps and brought out to the urethral meatus however a 0.038 Sensor wire was unable to be advanced through the stent.  The cystoscope was repassed and the Sensor wire was then advanced into the ureter and to the renal pelvis under fluoroscopic guidance.  The cystoscope was removed and the stent was then removed.  A 4.5 Fr semirigid ureteroscope was then advanced into the ureter without difficulty and the calculus was near the UVJ.  It was placed in a 1.9 French 0 tip nitinol basket and removed without difficulty.  The ureteroscope was repassed and a second 5 mm calculus was identified in the distal ureter.  This calculus was then fragmented with a 200 micron holmium laser fiber on a setting of 0.8 J and frequency of 8 hz.   All fragments were then removed from the ureter with a zero tip nitinol basket.  Reinspection of the ureter revealed no remaining visible stones or fragments.   The semirigid ureteroscope was removed and a single channel digital flexible ureteroscope was passed per urethra.  The left ureter orifice was engaged without dilation.  The ureter was wide caliber and rather tortuous.  Once in the renal pelvis all calyces were examined and a 5 mm calculus with granular calculus particles was identified in the lower pole calyx.  The 200 m holmium laser fiber was placed through the ureteroscope and the stone was dusted at 0.2 J / 40 Hz.  During dusting the stone fragmented into  2 pieces and these fragments were removed with the basket.  The ureteroscope was repassed.  Retrograde pyelogram was performed through the ureteroscope with findings as described above.  All calyces were examined.   No significantly sized fragments were identified in the lower pole calyx.  The ureteroscope was removed and no ureteral fragments were identified.  A 6 FR/26 CM stent was placed under fluoroscopic guidance.  The wire was then removed with an adequate stent curl noted in the renal pelvis as well as in the bladder.  The bladder was then emptied and the procedure ended.  The patient appeared to tolerate the procedure well and without complications.  After anesthetic reversal the patient was transported to the PACU in stable condition.   Plan:  The stent was left attached to a tether and the patient was instructed to remove on Thursday 07/18/19.   Leiam Giovanni, MD

## 2019-07-16 NOTE — Anesthesia Procedure Notes (Signed)
Procedure Name: Intubation Date/Time: 07/16/2019 11:38 AM Performed by: Doreen Salvage, CRNA Pre-anesthesia Checklist: Patient identified, Patient being monitored, Timeout performed, Emergency Drugs available and Suction available Patient Re-evaluated:Patient Re-evaluated prior to induction Oxygen Delivery Method: Circle system utilized Preoxygenation: Pre-oxygenation with 100% oxygen Induction Type: IV induction Ventilation: Mask ventilation without difficulty Laryngoscope Size: Mac, 3 and 4 Grade View: Grade I Tube type: Oral Tube size: 7.5 mm Number of attempts: 1 Airway Equipment and Method: Stylet Placement Confirmation: ETT inserted through vocal cords under direct vision,  positive ETCO2 and breath sounds checked- equal and bilateral Secured at: 23 cm Tube secured with: Tape Dental Injury: Teeth and Oropharynx as per pre-operative assessment

## 2019-07-16 NOTE — Transfer of Care (Signed)
Immediate Anesthesia Transfer of Care Note  Patient: Robert [name]  Procedure(s) Performed: Procedure(s): CYSTOSCOPY/URETEROSCOPY/HOLMIUM LASER/STENT exchange (Left)  Patient Location: PACU  Anesthesia Type:General  Level of Consciousness: sedated  Airway & Oxygen Therapy: Patient Spontanous Breathing and Patient connected to face mask oxygen  Post-op Assessment: Report given to RN and Post -op Vital signs reviewed and stable  Post vital signs: Reviewed and stable  Last Vitals:  Vitals:   07/16/19 0835 07/16/19 1252  BP: (!) 164/85 (!) 145/66  Pulse: 83 71  Resp: 16 19  Temp: 36.4 C (!) 36.2 C  SpO2: [id number]    Complications: No apparent anesthesia complications

## 2019-07-22 ENCOUNTER — Telehealth: Payer: Self-pay

## 2019-07-22 LAB — CALCULI, WITH PHOTOGRAPH (CLINICAL LAB)
Uric Acid Calculi: 100 %
Weight Calculi: 39 mg

## 2019-07-22 NOTE — Telephone Encounter (Signed)
The nurse from [facility] ) called, Patients wife told her she believes patient is having symptoms of UTI. Wife stated he has confusion at night like he did last time and also back pain. Robert [name] also states his urine does have an odor. Robert [name] was able to obtain a UA and culture from him to send out. Patient wife states it is difficult to get patient to our office. [place] wants to know if we are able to fax an order over to her at [phone].  The Order has to have the ICD Code along with UA and Urine Culture.

## 2019-07-23 ENCOUNTER — Other Ambulatory Visit: Payer: Medicare PPO

## 2019-07-23 ENCOUNTER — Telehealth: Payer: Self-pay | Admitting: Adult Health Nurse Practitioner

## 2019-07-23 ENCOUNTER — Other Ambulatory Visit: Payer: Self-pay

## 2019-07-23 ENCOUNTER — Telehealth: Payer: Self-pay | Admitting: *Deleted

## 2019-07-23 DIAGNOSIS — N390 Urinary tract infection, site not specified: Secondary | ICD-10-CM

## 2019-07-23 NOTE — Telephone Encounter (Signed)
Talked with nurse today and she states she never got a order to send off UA/culture for his Possible UTI. I talked with Sam today and she said for them to bring a sample to the office and drop it off. Nurse did not say anything to me about his confusion at night.

## 2019-07-23 NOTE — Telephone Encounter (Signed)
A culture will take at least 48-72 hours.  If having confusion recommend ED evaluation

## 2019-07-23 NOTE — Telephone Encounter (Signed)
As per sam due to patients symptoms does not warrant for UA/ Culture, forward to provider

## 2019-07-23 NOTE — Telephone Encounter (Signed)
Spoke with wife.  Patient was recently in hospital and required rehab at SNF afterwards.  He is now back home and is getting care from CNAs and RN at [facility].  States that he is getting good care and will call provider with any concerns. Anelle Parlow K. Olena Heckle NP

## 2019-07-24 LAB — URINALYSIS, COMPLETE
Bilirubin, UA: NEGATIVE
Glucose, UA: NEGATIVE
Ketones, UA: NEGATIVE
Leukocytes,UA: NEGATIVE
Nitrite, UA: NEGATIVE
Specific Gravity, UA: 1.025 (ref 1.005–1.030)
Urobilinogen, Ur: 0.2 mg/dL (ref 0.2–1.0)
pH, UA: 5.5 (ref 5.0–7.5)

## 2019-07-24 LAB — MICROSCOPIC EXAMINATION: RBC, Urine: >30 /hpf — AB (ref 0–2)

## 2019-07-25 ENCOUNTER — Ambulatory Visit: Payer: Medicare PPO | Admitting: Nurse Practitioner

## 2019-07-25 ENCOUNTER — Other Ambulatory Visit: Payer: Self-pay

## 2019-07-25 ENCOUNTER — Encounter: Payer: Self-pay | Admitting: Nurse Practitioner

## 2019-07-25 VITALS — BP 160/80 | HR 76 | Temp 97.8°F | Resp 16 | Ht 71.0 in | Wt 170.0 lb

## 2019-07-25 DIAGNOSIS — H6122 Impacted cerumen, left ear: Secondary | ICD-10-CM

## 2019-07-25 NOTE — Progress Notes (Signed)
Careteam: Patient Care Team: Dion Body, MD as PCP - General (Family Medicine)  Advanced Directive information Does Patient Have a Medical Advance Directive?: Yes, Type of Advance Directive: Healthcare Power of Attorney, Does patient want to make changes to medical advance directive?: No - Patient declined  Allergies  Allergen Reactions  . Morphine And Related Nausea And Vomiting  . Sulfamethoxazole-Trimethoprim Rash  . Tramadol Nausea And Vomiting    Chief Complaint  Patient presents with  . Acute Visit    Ear Lavage      HPI: Patient is a 86 y.o. male seen in today at the [facility] for possible  impacted cerumen to left ear.  Noticed that his hearing aide was not as effectived and having more hearing loss in the left. Generally goes to the ENT for cleaning and they get "gobs" out. Did not really want to travel off campus so here today for cleaning. 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  Met with the [place] nurse who recommended debrox and to follow up today.  Review of Systems:  Review of Systems  Constitutional: Negative for chills and fever.  HENT: Positive for hearing loss. Negative for ear discharge, ear pain and tinnitus.     Past Medical History:  Diagnosis Date  . Aortic atherosclerosis ([place]) 03/01/2016  . Arthritis   . 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 High cholesterol    no meds  . History of kidney stones   . Hypertension    no meds  . TIA (transient ischemic attack)    residual right eye weakness and swallowing  . 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 CYSTOSCOPY W/ RETROGRADES Left 03/23/2018   Procedure: CYSTOSCOPY WITH RETROGRADE PYELOGRAM;  Surgeon: Billey Co, MD;  Location: [facility] ORS;  Service: Urology;  Laterality: Left;  . CYSTOSCOPY W/ RETROGRADES Left 06/21/2019   Procedure: CYSTOSCOPY WITH RETROGRADE PYELOGRAM;  Surgeon:  [name], MD;  Location: [facility] OR[facility];  Service: Urology;  Laterality: Left;  . CYSTOSCOPY WITH STENT PLACEMENT Left 02/20/2018   Procedure: CYSTOSCOPY WITH STENT PLACEMENT;  Surgeon: Billey Co, MD;  Location: [facility] OR[facility];  Service: Urology;  Laterality: Left;  . CYSTOSCOPY WITH STENT PLACEMENT Left 06/21/2019   Procedure: CYSTOSCOPY WITH STENT PLACEMENT;  Surgeon: [name], MD;  Location: [facility] OR[facility];  Service: Urology;  Laterality: Left;  . CYSTOSCOPY/URETEROSCOPY/HOLMIUM LASER/STENT PLACEMENT Left 03/23/2018   Procedure: CYSTOSCOPY/URETEROSCOPY/HOLMIUM LASER/STENT Exchange;  Surgeon: Billey Co, MD;  Location: [facility] OR[facility];  Service: Urology;  Laterality: Left;  left stent exchange  . CYSTOSCOPY/URETEROSCOPY/HOLMIUM LASER/STENT PLACEMENT Left 07/16/2019   Procedure: CYSTOSCOPY/URETEROSCOPY/HOLMIUM LASER/STENT exchange;  Surgeon: [name], MD;  Location: [facility] OR[facility];  Service: Urology;  Laterality: Left;  . LEG SURGERY     broken tib/fib  . TOTAL HIP ARTHROPLASTY Bilateral    Social History:   reports that he quit smoking about 11 years ago. His smoking use included pipe. He has never used smokeless tobacco. He reports previous alcohol use. He reports that he does not use drugs.  Family History  Problem Relation Age of Onset  . Prostate cancer Neg Hx   . Bladder Cancer Neg Hx   . Kidney cancer Neg Hx     Medications: Patient's Medications  New Prescriptions   No medications on file  Previous Medications  ACETAMINOPHEN (TYLENOL) 325 MG TABLET    Take 2 tablets (650 mg total) by mouth every 6 (six) hours as needed for mild pain (or Fever >/= 101).   ALUMINUM HYDROXIDE (DERMAMED) OINTMENT    Apply 1 application topically every 12 (twelve) hours as needed (wound).    ASPIRIN EC 81 MG TABLET    Take 81 mg by mouth daily.    ATORVASTATIN (LIPITOR) 10 MG TABLET    Take 1 tablet by mouth daily.   MULTIPLE VITAMIN (MULTIVITAMIN WITH MINERALS) TABS TABLET    Take 1 tablet by  mouth daily.   NUTRITIONAL SUPPLEMENTS (FEEDING SUPPLEMENT, NEPRO CARB STEADY,) LIQD    Take 237 mLs by mouth 3 (three) times daily with meals.   SENNA (SENOKOT) 8.6 MG TABLET    Take 1 tablet by mouth daily.   TAMSULOSIN (FLOMAX) 0.4 MG CAPS CAPSULE    Take 0.4 mg by mouth at bedtime.   Modified Medications   No medications on file  Discontinued Medications   SENNA (SENOKOT) 8.6 MG TABS TABLET    Take 1 tablet (8.6 mg total) by mouth 2 (two) times daily.    Physical Exam:  Vitals:   07/25/19 1559  BP: (!) 160/80  Pulse: 76  Resp: 16  Temp: 97.8 F (36.6 C)  SpO2: 94%  Weight: 170 lb (77.1 kg)  Height: _0  (1.803 m)   Body mass index is 23.71 kg/m. Wt Readings from Last 3 Encounters:  07/25/19 170 lb (77.1 kg)  07/16/19 177 lb 14.6 oz (80.7 kg)  07/11/19 178 lb (80.7 kg)    Physical Exam Constitutional:      Appearance: Normal appearance.  HENT:     Head: Normocephalic and atraumatic.     Right Ear: Tympanic membrane, ear canal and external ear normal.     Left Ear: There is impacted cerumen.  Neurological:     Mental Status: He is alert.    Labs reviewed: Basic Metabolic Panel: Recent Labs    06/25/19 0515 06/26/19 0717 06/27/19 0439  NA 144 145 147*  K 3.4* 3.4* 4.2  CL 110 110 113*  CO2 _1 GLUCOSE 112* 111* 115*  BUN 28* 34* 30*  CREATININE 0.99 0.96 0.89  CALCIUM 8.8* 8.7* 9.0  MG  --  2.0 2.1   Liver Function Tests: Recent Labs    06/21/19 0705  AST 38  ALT 16  ALKPHOS 57  BILITOT 1.3*  PROT 6.3*  ALBUMIN 3.6   No results for input(s): LIPASE, AMYLASE in the last 8760 hours. No results for input(s): AMMONIA in the last 8760 hours. CBC: Recent Labs    06/21/19 0705 06/22/19 0529 06/23/19 0508 06/25/19 0515 06/26/19 0717  WBC 21.3*   < > 8.9 10.9* 10.5  NEUTROABS 20.0*  --   --   --  7.2  HGB 12.6*   < > 11.6* 12.2* 11.2*  HCT 38.1*   < > 36.0* 35.7* 33.6*  MCV 96.5   < > 98.6 94.7 96.0  PLT 135*   < > 93* 111* 129*    < > = values in this interval not displayed.   Lipid Panel: No results for input(s): CHOL, HDL, LDLCALC, TRIG, CHOLHDL, LDLDIRECT in the last 8760 hours. TSH: No results for input(s): TSH in the last 8760 hours. A1C: No results found for: HGBA1C   Assessment/Plan 1. Impacted cerumen of left ear -pt with a small canal and generally has to go to ENT but did  not want to go off campus however - Ear Lavage preformed with curette device, small amount of but pt was not tolerating therefore stopped. Pt will plan to call ENT for further management.   Carlos American. Dougherty, [facility] [phone]

## 2019-07-27 LAB — CULTURE, URINE COMPREHENSIVE

## 2019-07-29 ENCOUNTER — Telehealth: Payer: Self-pay | Admitting: Physician Assistant

## 2019-07-29 NOTE — Telephone Encounter (Signed)
Please contact the patient and inform him that his urine culture has resulted negative.  He does not have a UTI.  He should keep his planned follow-up appointment with Dr. Bernardo Heater later this month.

## 2019-07-30 NOTE — Telephone Encounter (Signed)
Attempted to contact patient with no answer. Will attempt again later.

## 2019-07-30 NOTE — Telephone Encounter (Signed)
Notified patient as instructed, patient pleased. Discussed follow-up appointments, patient agrees  

## 2019-08-02 ENCOUNTER — Encounter: Payer: Self-pay | Admitting: *Deleted

## 2019-08-15 ENCOUNTER — Encounter: Payer: Self-pay | Admitting: Urology

## 2019-08-15 ENCOUNTER — Telehealth: Payer: Self-pay | Admitting: *Deleted

## 2019-08-15 ENCOUNTER — Telehealth (INDEPENDENT_AMBULATORY_CARE_PROVIDER_SITE_OTHER): Payer: Medicare PPO | Admitting: Urology

## 2019-08-15 ENCOUNTER — Other Ambulatory Visit: Payer: Self-pay

## 2019-08-15 ENCOUNTER — Encounter: Payer: Self-pay | Admitting: *Deleted

## 2019-08-15 DIAGNOSIS — N200 Calculus of kidney: Secondary | ICD-10-CM | POA: Diagnosis not present

## 2019-08-15 NOTE — Progress Notes (Signed)
Virtual Visit via Telephone Note  I connected with Robert [name] on 08/15/19 at 10:00 AM EDT by telephone and verified that I am speaking with the correct person using two identifiers.  His wife was also on the line with his permission  Location: Patient: Home Provider: [facility]   I discussed the limitations, risks, security and privacy concerns of performing an evaluation and management service by telephone and the availability of in person appointments. I also discussed with the patient that there may be a patient responsible charge related to this service. The patient expressed understanding and agreed to proceed.   History of Present Illness: 86 y.o. male with a history of recurrent stone disease.  He has had 2 episodes of sepsis requiring urgent stent placement in December 2019 and March 2021.  He underwent definitive stone treatment with left ureteroscopy on 07/16/2019.  He had 2 left distal ureteral calculi and a left renal calculus all which were treated.  His stent was left attached to a tether which she was instructed to remove on 07/18/2019.  He had no problems after stent removal and states he has been doing well.  Denies flank or abdominal pain.  Stone analysis was 100% uric acid.   Observations/Objective: N/A  Assessment and Plan: Recurrent stone former with 100% uric acid calculus  Follow Up Instructions: I recommended pursuing a metabolic evaluation to include a 24 urine study and blood work.  One of our clinical staff will contact them to order.  They will be notified with the results.   I discussed the assessment and treatment plan with the patient. The patient was provided an opportunity to ask questions and all were answered. The patient agreed with the plan and demonstrated an understanding of the instructions.   The patient was advised to call back or seek an in-person evaluation if the symptoms worsen or if the condition fails to improve as  anticipated.  I provided 15 minutes of non-face-to-face time during this encounter.   [name], MD

## 2019-08-15 NOTE — Telephone Encounter (Signed)
Send [facility] instructions in mail, Wife understands.

## 2019-08-20 ENCOUNTER — Encounter: Payer: Self-pay | Admitting: Nurse Practitioner

## 2019-08-20 ENCOUNTER — Ambulatory Visit: Payer: Medicare PPO | Admitting: Nurse Practitioner

## 2019-08-20 ENCOUNTER — Other Ambulatory Visit: Payer: Self-pay

## 2019-08-20 VITALS — BP 98/64 | HR 90 | Temp 96.6°F

## 2019-08-20 DIAGNOSIS — K5901 Slow transit constipation: Secondary | ICD-10-CM

## 2019-08-20 DIAGNOSIS — N200 Calculus of kidney: Secondary | ICD-10-CM

## 2019-08-20 DIAGNOSIS — M545 Low back pain, unspecified: Secondary | ICD-10-CM

## 2019-08-20 DIAGNOSIS — R1310 Dysphagia, unspecified: Secondary | ICD-10-CM

## 2019-08-20 DIAGNOSIS — Z7189 Other specified counseling: Secondary | ICD-10-CM | POA: Diagnosis not present

## 2019-08-20 DIAGNOSIS — J302 Other seasonal allergic rhinitis: Secondary | ICD-10-CM | POA: Diagnosis not present

## 2019-08-20 DIAGNOSIS — R350 Frequency of micturition: Secondary | ICD-10-CM

## 2019-08-20 DIAGNOSIS — G8929 Other chronic pain: Secondary | ICD-10-CM

## 2019-08-20 DIAGNOSIS — Z8673 Personal history of transient ischemic attack (TIA), and cerebral infarction without residual deficits: Secondary | ICD-10-CM

## 2019-08-20 DIAGNOSIS — M159 Polyosteoarthritis, unspecified: Secondary | ICD-10-CM

## 2019-08-20 DIAGNOSIS — M8949 Other hypertrophic osteoarthropathy, multiple sites: Secondary | ICD-10-CM

## 2019-08-20 DIAGNOSIS — R634 Abnormal weight loss: Secondary | ICD-10-CM

## 2019-08-20 DIAGNOSIS — F5101 Primary insomnia: Secondary | ICD-10-CM

## 2019-08-20 DIAGNOSIS — E785 Hyperlipidemia, unspecified: Secondary | ICD-10-CM

## 2019-08-20 DIAGNOSIS — N401 Enlarged prostate with lower urinary tract symptoms: Secondary | ICD-10-CM

## 2019-08-20 MED ORDER — LORATADINE 10 MG PO TABS: 10.0000 mg | ORAL_TABLET | Freq: Every day | ORAL | 3 refills | Status: DC

## 2019-08-20 MED ORDER — ACETAMINOPHEN 325 MG PO TABS: 650.0000 mg | ORAL_TABLET | Freq: Four times a day (QID) | ORAL | 1 refills | Status: AC | PRN

## 2019-08-20 MED ORDER — MIRTAZAPINE 15 MG PO TABS: 15.0000 mg | ORAL_TABLET | Freq: Every day | ORAL | 3 refills | Status: DC

## 2019-08-20 NOTE — Patient Instructions (Addendum)
To start Remeron 15 mg by mouth at bedtime for sleep and to increase appetite  Can use tylenol 500 mg 1-2 tablets every 8 hours as needed pain or tylenol 325 mg 1-2 tablets every 6 hours MAX tylenol is 3000 mg in 24 hours STOP tylenol PM  To use Claritin for sneezing and runny nose, can use mucinex for congestion.

## 2019-08-20 NOTE — Progress Notes (Signed)
Careteam: Patient Care Team: Lauree Chandler, NP as PCP - General (Geriatric Medicine) Dasher, Rayvon Char, MD (Dermatology) Birder Robson, MD as Referring Physician (Ophthalmology) Clyde Canterbury, MD as Referring Physician (Otolaryngology) [name], MD (Urology)  Advanced Directive information Does Patient Have a Medical Advance Directive?: Yes, Type of Advance Directive: [place];Living will, Does patient want to make changes to medical advance directive?: No - Patient declined  Allergies  Allergen Reactions  . Morphine And Related Nausea And Vomiting  . Sulfamethoxazole-Trimethoprim Rash  . Tramadol Nausea And Vomiting    Chief Complaint  Patient presents with  . Establish Care    New patient establish care. Patient wakes up frequently to urinate.   . Back Pain    Patient c/o right lower back pain, long-term concern. Patient wakes up 4 time nightly as a result of pain. Patient is using tylenol and heating pad for pain. Patient mentioned to previous provider and recommendation was pallitive care   . Weight Loss    Patient with decreased appetite, loss 20 pounds in the hospital (recently, March 2021)   . Cough    Patient with breathing and coughing issues at night, taking mucinex.   . Medication Management    Patient is taking Nepro (feeding supplement) once daily vs three times daily      HPI: Patient is a 86 y.o. male seen in today at the [facility] to establish care.   Hx of Pressure ulcer to sacrum- uses barrier cream to prevent breakdown.  OA of Bilateral hip s/p replacement- required to use amoxillin 2 g prior to dental appts. Also with left knee orthoscopy and now pain in right knee. Currently in PT 3 times weekly. Has another week left per insurance.   Chronic low back pain- using heating pad but wakes him up at night. Tylenol is effective as well.   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   Insomnia- up several times at night. 3-4 times. Gets up and goes to the bathroom. After he gets up and down his back starts to hurt and then wife gives him tylenol.   Weight loss- nutritional supplement once daily, nephro is the only supplement he will eat. No appetite, stomach feels full.  No nausea or vomiting.  Constipation- controlled with senna and actually had loose stools.   BPH- follows with urologist, hx of prostate cancer Also with hx of recurrent UTI and kidney stone. Pt with elevated uric acid found in urine.  Per urology notes: He has had 2 episodes of sepsis requiring urgent stent placement in December 2019 and March 2021.  He underwent definitive stone treatment with left ureteroscopy on 07/16/2019.  He had 2 left distal ureteral calculi and a left renal calculus all which were treated.  His stent was left attached to a tether which she was instructed to remove on 07/18/2019.  He had no problems after stent removal and states he has been doing well.   HOH- follows with ENT for wax removal and with bilateral hearing aides.   Has sneezing, runny nose- using mucinex   dyphagia- not on special diet because he can not tolerate it, aware he is at risk for aspiration Pnemonia. Holds fluids in his mouth prior to swallowing.    Review of Systems:  Review of Systems  Constitutional: Positive for malaise/fatigue and weight loss. Negative for chills and  fever.  HENT: Positive for hearing loss. Negative for tinnitus.   Respiratory: Negative for cough, sputum production and shortness of breath.   Cardiovascular: Negative for chest pain, palpitations and leg swelling.  Gastrointestinal: Negative for abdominal pain, constipation, diarrhea and heartburn.  Genitourinary: Negative for dysuria, frequency and urgency.  Musculoskeletal: Positive for back pain and joint pain. Negative for falls and myalgias.  Skin: Negative.   Neurological:  Positive for focal weakness and weakness. Negative for dizziness and headaches.  Endo/Heme/Allergies: Positive for environmental allergies.  Psychiatric/Behavioral: Negative for depression and memory loss. The patient has insomnia. The patient is not nervous/anxious.     Past Medical History:  Diagnosis Date  . Acute bronchitis    Per incoming records from [place]  . Acute constipation    Per incoming records from [place]  . Aortic atherosclerosis ([place]) 03/01/2016  . Arthritis   . Asymptomatic bilateral carotid artery stenosis    Per incoming records from [place]  . Bradycardia    Per incoming records from [place]  . Candidiasis    Per incoming records from [place]  . Cellulitis of right lower extremity    Per incoming records from [place]  . CKD (chronic kidney disease), stage III    Per [facility] New Patient Packet  . 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 Erectile dysfunction    Per incoming records from [facility]  . High cholesterol    no meds  . History of CVA (cerebrovascular accident)    Per [facility] New Patient Packet  . History of kidney stones   . History of UTI    Per [facility] New Patient Packet  . Hyperkalemia    Per incoming records from [place]  . Hypertension    no meds  . Left thigh pain    Per incoming records from [place]  . Left wrist pain    Per incoming records from [place]  . Nephrolithiasis    Per incoming records from [place]  . Partial deafness    Per [facility] New Patient Packet  . Pneumonia 2013   Per [facility] New Patient Packet  . Pneumonia 2021   Per [facility] New Patient Packet  . Prostate cancer [facility])    Per [facility] New Patient Packet  . Pure hypercholesterolemia    Per incoming records from [place]  . Sepsis due to other etiology [facility])    Per incoming records from [place]  . TIA (transient ischemic attack)    residual right eye weakness and swallowing  . TIA (transient ischemic attack) 2012    Per [facility] Patient Packet  . TIA (transient ischemic attack) 2014   Per [facility] Patient Packet  . TIA (transient ischemic attack) 2016   Per [facility] Patient Packet  . 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 CATARACT EXTRACTION Bilateral    Per [facility] New Patient Packet, Dr.Thompson   . COLONOSCOPY  1997   Per [facility] New Patient Packet  . CYSTOSCOPY W/ RETROGRADES Left 03/23/2018   Procedure: CYSTOSCOPY WITH RETROGRADE PYELOGRAM;  Surgeon: Billey Co, MD;  Location: [facility] ORS;  Service: Urology;  Laterality: Left;  . CYSTOSCOPY W/ RETROGRADES Left 06/21/2019   Procedure: CYSTOSCOPY WITH RETROGRADE PYELOGRAM;  Surgeon: [name], MD;  Location: [facility] ORS;  Service: Urology;  Laterality: Left;  . CYSTOSCOPY WITH STENT PLACEMENT Left 02/20/2018   Procedure: CYSTOSCOPY WITH STENT PLACEMENT;  Surgeon: Billey Co, MD;  Location: [facility] ORS;  Service: Urology;  Laterality: Left;  . CYSTOSCOPY WITH STENT PLACEMENT Left 06/21/2019   Procedure: CYSTOSCOPY WITH STENT PLACEMENT;  Surgeon: [name], MD;  Location: [facility] ORS;  Service: Urology;  Laterality: Left;  . CYSTOSCOPY/URETEROSCOPY/HOLMIUM LASER/STENT PLACEMENT Left 03/23/2018   Procedure: CYSTOSCOPY/URETEROSCOPY/HOLMIUM LASER/STENT Exchange;  Surgeon: Billey Co, MD;  Location: [facility] ORS;  Service: Urology;  Laterality: Left;  left stent exchange  . CYSTOSCOPY/URETEROSCOPY/HOLMIUM LASER/STENT PLACEMENT Left 07/16/2019   Procedure: CYSTOSCOPY/URETEROSCOPY/HOLMIUM LASER/STENT exchange;  Surgeon: [name], MD;  Location: [facility] ORS;  Service: Urology;  Laterality: Left;  . KNEE ARTHROSCOPY Right 1998   Per [facility] New Patient Packet  . LEG SURGERY     broken tib/fib  . TOTAL HIP ARTHROPLASTY Bilateral    2007 & 2012   Social History:   reports that he quit smoking about 11 years ago. His smoking use included pipe. He quit after 60.00 years of use. He has never used smokeless  tobacco. He reports current alcohol use. He reports that he does not use drugs.  Family History  Adopted: Yes  Problem Relation Age of Onset  . Diabetes Daughter   . Prostate cancer Neg Hx   . Bladder Cancer Neg Hx   . Kidney cancer Neg Hx     Medications: Patient's Medications  New Prescriptions   No medications on file  Previous Medications   ACETAMINOPHEN (TYLENOL) 500 MG TABLET    Take 500 mg by mouth as needed (Back pain).   ALUMINUM HYDROXIDE (DERMAMED) OINTMENT    Apply 1 application topically every 12 (twelve) hours as needed (wound).    AMOXICILLIN (AMOXIL) 500 MG CAPSULE    Take 2,000 mg by mouth once. 1 hour prior to dental appointment   ASPIRIN EC 81 MG TABLET    Take 81 mg by mouth daily.    ATORVASTATIN (LIPITOR) 10 MG TABLET    Take 1 tablet by mouth daily.   DIPHENHYDRAMINE-ACETAMINOPHEN (TYLENOL PM) 25-500 MG TABS TABLET    Take 1 tablet by mouth at bedtime.    GUAIFENESIN (MUCINEX) 600 MG 12 HR TABLET    Take 600 mg by mouth as needed.   MULTIPLE VITAMIN (MULTIVITAMIN WITH MINERALS) TABS TABLET    Take 1 tablet by mouth daily.   NUTRITIONAL SUPPLEMENTS (FEEDING SUPPLEMENT, NEPRO CARB STEADY,) LIQD    Take 237 mLs by mouth 3 (three) times daily with meals.   OMEGA-3 FATTY ACIDS (OMEGA-3 FISH OIL PO)    Take 1 capsule by mouth daily. [id number] mg   SENNA (SENOKOT) 8.6 MG TABLET    Take 1-2 tablets by mouth as needed.    TAMSULOSIN (FLOMAX) 0.4 MG CAPS CAPSULE    Take 0.4 mg by mouth at bedtime.   Modified Medications   No medications on file  Discontinued Medications   No medications on file    Physical Exam:  Vitals:   08/20/19 1006  BP: 98/64  Pulse: 90  Temp: (!) 96.6 F (35.9 C)  SpO2: 99%   There is no height or weight on file to calculate BMI. Wt Readings from Last 3 Encounters:  07/25/19 170 lb (77.1 kg)  07/16/19 177 lb 14.6 oz (80.7 kg)  07/11/19 178 lb (80.7 kg)    Physical Exam Constitutional:      General: He is not in acute distress.     Appearance: Normal appearance. He is not diaphoretic.  HENT:     Head: Normocephalic and atraumatic.  Eyes:  Conjunctiva/sclera: Conjunctivae normal.     Pupils: Pupils are equal, round, and reactive to light.  Cardiovascular:     Rate and Rhythm: Normal rate and regular rhythm.     Heart sounds: Normal heart sounds.  Pulmonary:     Effort: Pulmonary effort is normal.     Breath sounds: Normal breath sounds.  Abdominal:     General: Bowel sounds are normal.     Palpations: Abdomen is soft.  Musculoskeletal:        General: No tenderness.     Cervical back: Normal range of motion and neck supple.     Right lower leg: No edema.     Left lower leg: No edema.  Skin:    General: Skin is warm and dry.  Neurological:     Mental Status: He is alert and oriented to person, place, and time. Mental status is at baseline.  Psychiatric:        Mood and Affect: Mood normal.        Behavior: Behavior normal.     Labs reviewed: Basic Metabolic Panel: Recent Labs    06/25/19 0515 06/26/19 0717 06/27/19 0439  NA 144 145 147*  K 3.4* 3.4* 4.2  CL 110 110 113*  CO2 28 25 28   GLUCOSE 112* 111* 115*  BUN 28* 34* 30*  CREATININE 0.99 0.96 0.89  CALCIUM 8.8* 8.7* 9.0  MG  --  2.0 2.1   Liver Function Tests: Recent Labs    06/21/19 0705  AST 38  ALT 16  ALKPHOS 57  BILITOT 1.3*  PROT 6.3*  ALBUMIN 3.6   No results for input(s): LIPASE, AMYLASE in the last 8760 hours. No results for input(s): AMMONIA in the last 8760 hours. CBC: Recent Labs    06/21/19 0705 06/22/19 0529 06/23/19 0508 06/25/19 0515 06/26/19 0717  WBC 21.3*   < > 8.9 10.9* 10.5  NEUTROABS 20.0*  --   --   --  7.2  HGB 12.6*   < > 11.6* 12.2* 11.2*  HCT 38.1*   < > 36.0* 35.7* 33.6*  MCV 96.5   < > 98.6 94.7 96.0  PLT 135*   < > 93* 111* 129*   < > = values in this interval not displayed.   Lipid Panel: No results for input(s): CHOL, HDL, LDLCALC, TRIG, CHOLHDL, LDLDIRECT in the last 8760  hours. TSH: No results for input(s): TSH in the last 8760 hours. A1C: No results found for: HGBA1C   Assessment/Plan 1. Advanced directives, counseling/discussion -discussed advanced directives, plan to complete MOST form during AWV.  - DNR (Do Not Resuscitate)  2. Primary insomnia -ongoing trouble sleeping. Will add remeron due to decrease in appetite. He also may benefit from low dose melatonin.  - mirtazapine (REMERON) 15 MG tablet; Take 1 tablet (15 mg total) by mouth at bedtime.  Dispense: 30 tablet; Refill: 3  3. Weight loss -ongoing, dysphagia likely contributing. Diet recommendations has been made but he does not like the consistency with this. He is very particular on supplements and wife having to order online. Only uses daily. Discussing increasing calories and foods with good nutritional value - mirtazapine (REMERON) 15 MG tablet; Take 1 tablet (15 mg total) by mouth at bedtime.  Dispense: 30 tablet; Refill: 3  4. Seasonal allergies - loratadine (CLARITIN) 10 MG tablet; Take 1 tablet (10 mg total) by mouth daily.  Dispense: 30 tablet; Refill: 3  5. Slow transit constipation -has improved, uses senna  PRN.  6. History of stroke -continues on ASA, lipitor.   7. Primary osteoarthritis involving multiple joints Ongoing pain and unsteady gait. Currently undergoing PT.  - acetaminophen (TYLENOL) 325 MG tablet; Take 2 tablets (650 mg total) by mouth every 6 (six) hours as needed.  Dispense: 90 tablet; Refill: 1  8. Dysphagia, unspecified type Ongoing, at high risk for aspiration pneumonia and wife and him are aware of this however choses not to follow up recommendations due to quality of life preferences.   9. Chronic low back pain without sciatica, unspecified back pain laterality Ongoing, heat and tylenol effective, which he can continue tylenol 500 mg 1-2 tablets every 8 hours OR tylenol 325 mg 1-2 tablets every 6, not to exceed 3000 mg of tylenol from all sources.  Continues with PT.   10. Hyperlipidemia LDL goal <70 LDL at goal on recent labs, continues on lipitor   11. Benign prostatic hyperplasia with urinary frequency Ongoing, followed by urology, continues on flomax 0.4 mg qhs  12. Uric acid nephrolithiasis -currently undergoing evaluation with blood and urine studies through urologist.    Next appt: 6 weeks for AWV and follow up on appetite and sleep Caroleann Casler K. Harle Battiest  [facility] [phone]   Total time 60 min time greater than 50% of total time spent doing pt counseling and coordination of care.

## 2019-08-20 NOTE — Telephone Encounter (Signed)
Immunizations updated and forwarded message to [place].

## 2019-08-22 ENCOUNTER — Telehealth: Payer: Self-pay

## 2019-08-22 NOTE — Telephone Encounter (Signed)
Discussed with patient's wife Jessica's recommendations. She agreed with recommendations. Also informed her about the process if needed for after hours emergencies and she was satisfied.

## 2019-08-22 NOTE — Telephone Encounter (Signed)
She can reduce the Remeron to 7.5  (half tablet) and see if that works better. We do not want him sleepy to the point he is very drowsy during the day too.   If there is any emergency they should call 911 or go to an urgent care or ER, if they have an after hours question we do have an on call service and she would call the office number to get in touch with the provider on call. If he can wait until the next business say she can send [place] a msg through [place] or call the office and talk to the triage line who will get the msg to me.  It is up to her if she feels like she still needs that company to provide care if needed.

## 2019-08-22 NOTE — Telephone Encounter (Addendum)
Patient's wife, Tylen Baab, called with concerns about the Remeron. She stated that the medication has completely knocked him out to the point that he is not only sleeping all night, but she can hardly get him up and he is hardly eating.Patient's wife also wanted to know if she could have the Remeron reduced or should she just continue until she comes in on Tuesday.  She also stated that there is a company by the name [employer] that has someone that can provide care on the weekend for emergency situations. Wanted to know if she should keep company or just dismiss them. Also wants to know how after hours emergencies are handled through the office.

## 2019-08-22 NOTE — Telephone Encounter (Signed)
Patients wife called to ask a few questions she said  she wanted to know how would they contact Janett Billow if it was a weekend and I explained to her we have an on call system for our providers and then she wanted to know what was Jessica's direct line here in the office and I told her [phone] after that she wanted to know to what number would she call for a direct line for Janett Billow at the facility clinic where they lived in [place] and I told her that information I did not have but that Janett Billow was there today for clinic and that she could go and ask her and she could tell her better than I

## 2019-08-22 NOTE — Telephone Encounter (Addendum)
Opened in error

## 2019-08-24 ENCOUNTER — Encounter: Payer: Self-pay | Admitting: Nurse Practitioner

## 2019-08-27 ENCOUNTER — Other Ambulatory Visit: Payer: Self-pay | Admitting: Nurse Practitioner

## 2019-08-27 ENCOUNTER — Telehealth: Payer: Self-pay

## 2019-08-27 NOTE — Telephone Encounter (Signed)
Patients wife stopped by the [facility] as instructed by Lauree Chandler, NP to bring pill bottles to confirm medications.  All medications confirmed and list updated according to pill bottles present.   Sherrie Mustache, NP was verbally informed of these changes

## 2019-09-03 ENCOUNTER — Inpatient Hospital Stay (HOSPITAL_COMMUNITY): Payer: Medicare PPO

## 2019-09-03 ENCOUNTER — Inpatient Hospital Stay (HOSPITAL_COMMUNITY)
Admission: EM | Admit: 2019-09-03 | Discharge: 2019-09-11 | DRG: 884 | Disposition: A | Discharge status: Skilled Nursing Facility | Payer: Medicare PPO | Attending: Internal Medicine | Admitting: Internal Medicine

## 2019-09-03 ENCOUNTER — Emergency Department (HOSPITAL_COMMUNITY): Payer: Medicare PPO

## 2019-09-03 ENCOUNTER — Other Ambulatory Visit (HOSPITAL_COMMUNITY): Payer: Medicare PPO

## 2019-09-03 DIAGNOSIS — R1310 Dysphagia, unspecified: Secondary | ICD-10-CM | POA: Diagnosis not present

## 2019-09-03 DIAGNOSIS — N179 Acute kidney failure, unspecified: Secondary | ICD-10-CM | POA: Diagnosis present

## 2019-09-03 DIAGNOSIS — R4189 Other symptoms and signs involving cognitive functions and awareness: Secondary | ICD-10-CM | POA: Diagnosis not present

## 2019-09-03 DIAGNOSIS — Z8546 Personal history of malignant neoplasm of prostate: Secondary | ICD-10-CM

## 2019-09-03 DIAGNOSIS — R4781 Slurred speech: Secondary | ICD-10-CM | POA: Diagnosis present

## 2019-09-03 DIAGNOSIS — R410 Disorientation, unspecified: Secondary | ICD-10-CM | POA: Diagnosis present

## 2019-09-03 DIAGNOSIS — Z20822 Contact with and (suspected) exposure to covid-19: Secondary | ICD-10-CM | POA: Diagnosis present

## 2019-09-03 DIAGNOSIS — Z885 Allergy status to narcotic agent status: Secondary | ICD-10-CM

## 2019-09-03 DIAGNOSIS — N1831 Chronic kidney disease, stage 3a: Secondary | ICD-10-CM | POA: Diagnosis present

## 2019-09-03 DIAGNOSIS — E785 Hyperlipidemia, unspecified: Secondary | ICD-10-CM | POA: Diagnosis present

## 2019-09-03 DIAGNOSIS — I1310 Hypertensive heart and chronic kidney disease without heart failure, with stage 1 through stage 4 chronic kidney disease, or unspecified chronic kidney disease: Secondary | ICD-10-CM | POA: Diagnosis present

## 2019-09-03 DIAGNOSIS — Z882 Allergy status to sulfonamides status: Secondary | ICD-10-CM

## 2019-09-03 DIAGNOSIS — Z7982 Long term (current) use of aspirin: Secondary | ICD-10-CM | POA: Diagnosis not present

## 2019-09-03 DIAGNOSIS — Z6825 Body mass index (BMI) 25.0-25.9, adult: Secondary | ICD-10-CM

## 2019-09-03 DIAGNOSIS — J9601 Acute respiratory failure with hypoxia: Secondary | ICD-10-CM | POA: Diagnosis present

## 2019-09-03 DIAGNOSIS — Z96643 Presence of artificial hip joint, bilateral: Secondary | ICD-10-CM | POA: Diagnosis present

## 2019-09-03 DIAGNOSIS — J690 Pneumonitis due to inhalation of food and vomit: Secondary | ICD-10-CM | POA: Diagnosis present

## 2019-09-03 DIAGNOSIS — I639 Cerebral infarction, unspecified: Secondary | ICD-10-CM | POA: Diagnosis not present

## 2019-09-03 DIAGNOSIS — I700 Atherosclerosis of aorta: Secondary | ICD-10-CM | POA: Diagnosis present

## 2019-09-03 DIAGNOSIS — I6389 Other cerebral infarction: Secondary | ICD-10-CM | POA: Diagnosis not present

## 2019-09-03 DIAGNOSIS — Z66 Do not resuscitate: Secondary | ICD-10-CM | POA: Diagnosis not present

## 2019-09-03 DIAGNOSIS — F05 Delirium due to known physiological condition: Secondary | ICD-10-CM | POA: Diagnosis present

## 2019-09-03 DIAGNOSIS — E440 Moderate protein-calorie malnutrition: Secondary | ICD-10-CM | POA: Diagnosis present

## 2019-09-03 DIAGNOSIS — Z833 Family history of diabetes mellitus: Secondary | ICD-10-CM | POA: Diagnosis not present

## 2019-09-03 DIAGNOSIS — E876 Hypokalemia: Secondary | ICD-10-CM | POA: Diagnosis not present

## 2019-09-03 DIAGNOSIS — T17908A Unspecified foreign body in respiratory tract, part unspecified causing other injury, initial encounter: Secondary | ICD-10-CM

## 2019-09-03 DIAGNOSIS — Z8673 Personal history of transient ischemic attack (TIA), and cerebral infarction without residual deficits: Secondary | ICD-10-CM | POA: Diagnosis not present

## 2019-09-03 DIAGNOSIS — I69320 Aphasia following cerebral infarction: Secondary | ICD-10-CM

## 2019-09-03 DIAGNOSIS — R059 Cough, unspecified: Secondary | ICD-10-CM

## 2019-09-03 DIAGNOSIS — H9190 Unspecified hearing loss, unspecified ear: Secondary | ICD-10-CM | POA: Diagnosis present

## 2019-09-03 DIAGNOSIS — R0902 Hypoxemia: Secondary | ICD-10-CM

## 2019-09-03 DIAGNOSIS — E870 Hyperosmolality and hypernatremia: Secondary | ICD-10-CM | POA: Diagnosis present

## 2019-09-03 DIAGNOSIS — Z87891 Personal history of nicotine dependence: Secondary | ICD-10-CM | POA: Diagnosis not present

## 2019-09-03 DIAGNOSIS — E7800 Pure hypercholesterolemia, unspecified: Secondary | ICD-10-CM | POA: Diagnosis present

## 2019-09-03 DIAGNOSIS — R471 Dysarthria and anarthria: Secondary | ICD-10-CM | POA: Diagnosis present

## 2019-09-03 DIAGNOSIS — M1990 Unspecified osteoarthritis, unspecified site: Secondary | ICD-10-CM | POA: Diagnosis present

## 2019-09-03 DIAGNOSIS — I674 Hypertensive encephalopathy: Secondary | ICD-10-CM | POA: Diagnosis present

## 2019-09-03 DIAGNOSIS — F0390 Unspecified dementia without behavioral disturbance: Secondary | ICD-10-CM | POA: Diagnosis present

## 2019-09-03 DIAGNOSIS — N400 Enlarged prostate without lower urinary tract symptoms: Secondary | ICD-10-CM | POA: Diagnosis present

## 2019-09-03 DIAGNOSIS — G459 Transient cerebral ischemic attack, unspecified: Secondary | ICD-10-CM | POA: Diagnosis not present

## 2019-09-03 DIAGNOSIS — R05 Cough: Secondary | ICD-10-CM | POA: Diagnosis not present

## 2019-09-03 LAB — APTT: aPTT: 23 seconds — ABNORMAL LOW (ref 24–36)

## 2019-09-03 LAB — URINALYSIS, ROUTINE W REFLEX MICROSCOPIC
Bilirubin Urine: NEGATIVE
Glucose, UA: NEGATIVE mg/dL
Hgb urine dipstick: NEGATIVE
Ketones, ur: NEGATIVE mg/dL
Leukocytes,Ua: NEGATIVE
Nitrite: NEGATIVE
Protein, ur: NEGATIVE mg/dL
Specific Gravity, Urine: 1.023 (ref 1.005–1.030)
pH: 7 (ref 5.0–8.0)

## 2019-09-03 LAB — I-STAT CHEM 8, ED
BUN: 30 mg/dL — ABNORMAL HIGH (ref 8–23)
Calcium, Ion: 1.15 mmol/L (ref 1.15–1.40)
Chloride: 109 mmol/L (ref 98–111)
Creatinine, Ser: 1.4 mg/dL — ABNORMAL HIGH (ref 0.61–1.24)
Glucose, Bld: 88 mg/dL (ref 70–99)
HCT: 34 % — ABNORMAL LOW (ref 39.0–52.0)
Hemoglobin: 11.6 g/dL — ABNORMAL LOW (ref 13.0–17.0)
Potassium: 4.9 mmol/L (ref 3.5–5.1)
Sodium: 140 mmol/L (ref 135–145)
TCO2: 27 mmol/L (ref 22–32)

## 2019-09-03 LAB — MRSA PCR SCREENING: MRSA by PCR: NEGATIVE

## 2019-09-03 LAB — CBC WITH DIFFERENTIAL/PLATELET
Abs Immature Granulocytes: 0.07 10*3/uL (ref 0.00–0.07)
Basophils Absolute: 0.1 10*3/uL (ref 0.0–0.1)
Basophils Relative: 1 %
Eosinophils Absolute: 0.5 10*3/uL (ref 0.0–0.5)
Eosinophils Relative: 6 %
HCT: 38.5 % — ABNORMAL LOW (ref 39.0–52.0)
Hemoglobin: 12.3 g/dL — ABNORMAL LOW (ref 13.0–17.0)
Immature Granulocytes: 1 %
Lymphocytes Relative: 15 %
Lymphs Abs: 1.2 10*3/uL (ref 0.7–4.0)
MCH: 32.5 pg (ref 26.0–34.0)
MCHC: 31.9 g/dL (ref 30.0–36.0)
MCV: 101.6 fL — ABNORMAL HIGH (ref 80.0–100.0)
Monocytes Absolute: 0.7 10*3/uL (ref 0.1–1.0)
Monocytes Relative: 8 %
Neutro Abs: 5.6 10*3/uL (ref 1.7–7.7)
Neutrophils Relative %: 69 %
Platelets: 167 10*3/uL (ref 150–400)
RBC: 3.79 MIL/uL — ABNORMAL LOW (ref 4.22–5.81)
RDW: 14.4 % (ref 11.5–15.5)
WBC: 8 10*3/uL (ref 4.0–10.5)
nRBC: 0 % (ref 0.0–0.2)

## 2019-09-03 LAB — COMPREHENSIVE METABOLIC PANEL
ALT: 12 U/L (ref 0–44)
AST: 30 U/L (ref 15–41)
Albumin: 3.5 g/dL (ref 3.5–5.0)
Alkaline Phosphatase: 53 U/L (ref 38–126)
Anion gap: 12 (ref 5–15)
BUN: 25 mg/dL — ABNORMAL HIGH (ref 8–23)
CO2: 22 mmol/L (ref 22–32)
Calcium: 9.2 mg/dL (ref 8.9–10.3)
Chloride: 109 mmol/L (ref 98–111)
Creatinine, Ser: 1.45 mg/dL — ABNORMAL HIGH (ref 0.61–1.24)
GFR calc Af Amer: 47 mL/min — ABNORMAL LOW (ref >60)
GFR calc non Af Amer: 40 mL/min — ABNORMAL LOW (ref >60)
Glucose, Bld: 91 mg/dL (ref 70–99)
Potassium: 5 mmol/L (ref 3.5–5.1)
Sodium: 143 mmol/L (ref 135–145)
Total Bilirubin: 0.7 mg/dL (ref 0.3–1.2)
Total Protein: 6 g/dL — ABNORMAL LOW (ref 6.5–8.1)

## 2019-09-03 LAB — RAPID URINE DRUG SCREEN, HOSP PERFORMED
Amphetamines: NOT DETECTED
Barbiturates: NOT DETECTED
Benzodiazepines: NOT DETECTED
Cocaine: NOT DETECTED
Opiates: NOT DETECTED
Tetrahydrocannabinol: NOT DETECTED

## 2019-09-03 LAB — PROTIME-INR
INR: 1.1 (ref 0.8–1.2)
Prothrombin Time: 13.3 seconds (ref 11.4–15.2)

## 2019-09-03 LAB — SARS CORONAVIRUS 2 BY RT PCR (HOSPITAL ORDER, PERFORMED IN ~~LOC~~ HOSPITAL LAB): SARS Coronavirus 2: NEGATIVE

## 2019-09-03 LAB — ETHANOL: Alcohol, Ethyl (B): <10 mg/dL (ref <10)

## 2019-09-03 MED ORDER — SODIUM CHLORIDE 0.9 % IV SOLN: INTRAVENOUS | Status: DC

## 2019-09-03 MED ORDER — CLEVIDIPINE BUTYRATE 0.5 MG/ML IV EMUL
0.0000 mg/h | INTRAVENOUS | Status: DC
  Administered 2019-09-03: 1 mg/h via INTRAVENOUS
  Filled 2019-09-03: qty 50

## 2019-09-03 MED ORDER — CHLORHEXIDINE GLUCONATE CLOTH 2 % EX PADS
6.0000 | MEDICATED_PAD | Freq: Every day | CUTANEOUS | Status: DC
  Administered 2019-09-03 – 2019-09-11 (×7): 6 via TOPICAL

## 2019-09-03 MED ORDER — PANTOPRAZOLE SODIUM 40 MG IV SOLR
40.0000 mg | Freq: Every day | INTRAVENOUS | Status: DC
  Administered 2019-09-03: 40 mg via INTRAVENOUS
  Filled 2019-09-03: qty 40

## 2019-09-03 MED ORDER — SODIUM CHLORIDE 0.9 % IV BOLUS
500.0000 mL | Freq: Once | INTRAVENOUS | Status: AC
  Administered 2019-09-03: 500 mL via INTRAVENOUS

## 2019-09-03 MED ORDER — SODIUM CHLORIDE 0.9 % IV SOLN
50.0000 mL | Freq: Once | INTRAVENOUS | Status: AC
  Administered 2019-09-03: 50 mL via INTRAVENOUS

## 2019-09-03 MED ORDER — STROKE: EARLY STAGES OF RECOVERY BOOK
Status: AC
  Filled 2019-09-03: qty 1

## 2019-09-03 MED ORDER — LABETALOL HCL 5 MG/ML IV SOLN: 20.0000 mg | Freq: Once | INTRAVENOUS | Status: DC

## 2019-09-03 MED ORDER — ACETAMINOPHEN 325 MG PO TABS
650.0000 mg | ORAL_TABLET | ORAL | Status: DC | PRN
  Filled 2019-09-03: qty 2

## 2019-09-03 MED ORDER — SENNOSIDES-DOCUSATE SODIUM 8.6-50 MG PO TABS: 1.0000 | ORAL_TABLET | Freq: Every evening | ORAL | Status: DC | PRN

## 2019-09-03 MED ORDER — ALTEPLASE (STROKE) FULL DOSE INFUSION
0.9000 mg/kg | Freq: Once | INTRAVENOUS | Status: AC
  Administered 2019-09-03: 76.7 mg via INTRAVENOUS
  Filled 2019-09-03: qty 100

## 2019-09-03 MED ORDER — IOHEXOL 350 MG/ML SOLN
75.0000 mL | Freq: Once | INTRAVENOUS | Status: AC | PRN
  Administered 2019-09-03: 75 mL via INTRAVENOUS

## 2019-09-03 MED ORDER — ACETAMINOPHEN 650 MG RE SUPP: 650.0000 mg | RECTAL | Status: DC | PRN

## 2019-09-03 MED ORDER — ACETAMINOPHEN 160 MG/5ML PO SOLN
650.0000 mg | ORAL | Status: DC | PRN
  Filled 2019-09-03: qty 20.3

## 2019-09-03 MED ORDER — STROKE: EARLY STAGES OF RECOVERY BOOK: Freq: Once | Status: AC

## 2019-09-03 NOTE — H&P (Signed)
Stroke neurology history and physical  CC: Confusion, slurred speech  History is obtained from: Wife, patient  HPI: Robert [name] is a 86 y.o. male past medical history of aortic atherosclerosis, bradycardia, CKD stage III, history of a prior stroke with residual right eye deficits, relatively recent admission for UTI, nephrolithiasis, history of prostate cancer, multiple TIAs in the past according to the wife, presenting to the emergency room for evaluation of strokelike symptoms. His last known normal was 10:30 AM, and wife noted that around 1130 his speech was slurred and he was acting confused-words not making sense.  Symptoms were intermittent but not completely resolved.  He was brought in by the EMS crew to the emergency room and evaluated by the ED provider who activated a code stroke because he was within the window for IV TPA and intervention. The wife reports that he has been feeling rather sickly over the past few days to weeks with a poor appetite.  He walks with a walker at best with assistance.  Requires home health care aide assistance with bathing and other ADLs.  He is able to feed himself independently. He has not been complaining currently of any chest pain shortness of breath nausea or vomiting.  LKW: 10:30 AM on 09/03/2019 tpa given?:  Yes Premorbid modified Rankin scale (mRS): 4  ROS: ROS performed and negative except as noted in HPI.  Past Medical History:  Diagnosis Date  . Acute bronchitis    Per incoming records from [place]  . Acute constipation    Per incoming records from [place]  . Aortic atherosclerosis ([place]) 03/01/2016  . Arthritis   . Asymptomatic bilateral carotid artery stenosis    Per incoming records from [place]  . Bradycardia    Per incoming records from [place]  . Candidiasis    Per incoming records from [place]  . Cellulitis of right lower extremity    Per incoming records from [place]  . CKD (chronic kidney  disease), stage III    Per [place] New Patient Packet  . 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 Erectile dysfunction    Per incoming records from [place]  . High cholesterol    no meds  . History of CVA (cerebrovascular accident)    Per [place] New Patient Packet  . History of kidney stones   . History of UTI    Per [facility] New Patient Packet  . Hyperkalemia    Per incoming records from [place]  . Hypertension    no meds  . Left thigh pain    Per incoming records from [place]  . Left wrist pain    Per incoming records from [place]  . Nephrolithiasis    Per incoming records from [place]  . Partial deafness    Per [facility] New Patient Packet  . Pneumonia 2013   Per [facility] New Patient Packet  . Pneumonia 2021   Per [facility] New Patient Packet  . Prostate cancer [facility])    Per [facility] New Patient Packet  . Pure hypercholesterolemia    Per incoming records from [place]  . Sepsis due to other etiology [facility])    Per incoming records from [place]  . TIA (transient ischemic attack)    residual right eye weakness and swallowing  . TIA (transient ischemic attack) 2012   Per [place] Patient Packet  . TIA (transient ischemic attack) 2014   Per [place] Patient Packet  . TIA (transient ischemic attack) 2016   Per [place]  Patient Packet  . 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 Diabetes Daughter   . Prostate cancer Neg Hx   . Bladder Cancer Neg Hx   . Kidney cancer Neg Hx    Social History:   reports that he quit smoking about 11 years ago. His smoking use included pipe. He quit after 60.00 years of use. He has never used smokeless tobacco. He reports current alcohol use. He reports that he does not use drugs.  Medications No current facility-administered medications for this encounter.  Current Outpatient Medications:  .  aluminum hydroxide (DERMAMED) ointment, Apply 1 application topically every 12 (twelve)  hours as needed (skin breakdown). , Disp: , Rfl:  .  aspirin EC 81 MG tablet, Take 81 mg by mouth daily. , Disp: , Rfl:  .  atorvastatin (LIPITOR) 10 MG tablet, TAKE 1 TABLET BY MOUTH DAILY (Patient taking differently: Take 10 mg by mouth daily. ), Disp: 90 tablet, Rfl: 1 .  loratadine (CLARITIN) 10 MG tablet, Take 10 mg by mouth daily as needed for allergies., Disp: , Rfl:  .  Melatonin 10 MG TABS, Take 5-10 mg by mouth at bedtime as needed (sleep)., Disp: , Rfl:  .  Multiple Vitamin (MULTIVITAMIN) tablet, Take 1 tablet by mouth daily., Disp: , Rfl:  .  Nutritional Supplements (FEEDING SUPPLEMENT, NEPRO CARB STEADY,) LIQD, Take 237 mLs by mouth 3 (three) times daily between meals., Disp: , Rfl:  .  Omega-3 Fatty Acids (OMEGA-3 FISH OIL PO), Take 1 capsule by mouth daily. [id number] mg, Disp: , Rfl:  .  acetaminophen (TYLENOL) 325 MG tablet, Take 2 tablets (650 mg total) by mouth every 6 (six) hours as needed. (Patient taking differently: Take 650 mg by mouth every 6 (six) hours as needed for mild pain. ), Disp: 90 tablet, Rfl: 1 .  senna (SENOKOT) 8.6 MG tablet, Take 1-2 tablets by mouth as needed for constipation. , Disp: , Rfl:  .  tamsulosin (FLOMAX) 0.4 MG CAPS capsule, Take 0.4 mg by mouth at bedtime. , Disp: , Rfl:   Exam: Current vital signs: BP (!) (P) 152/74   Pulse 82   Resp 19   Wt 85.2 kg   BMI 26.20 kg/m  Vital signs in last 24 hours: Pulse Rate:  [62-82] 82 (05/18 1537) Resp:  [16-23] 19 (05/18 1537) BP: (150-195)/(58-98) (P) 152/74 (05/18 1537) Weight:  [85.2 kg] 85.2 kg (05/18 1400) GENERAL: Awake, alert in NAD HEENT: - Normocephalic and atraumatic, dry mm, no LN++, no Thyromegally LUNGS - Clear to auscultation bilaterally with no wheezes CV - S1S2 RRR, no m/r/g, equal pulses bilaterally. ABDOMEN - Soft, nontender, nondistended with normoactive BS Ext: warm, well perfused, intact peripheral pulses, no edema Neurological exam Is awake alert oriented to self, place, month  but not to the year. He reported his age is 31 when he is actually 9. His speech is mildly dysarthric He has normal repetition but has impaired naming and comprehension mildly. Cranial nerves: Pupils equal round react light, extraocular movements intact, visual field examination was not very reliable but does not seem to have any obvious field deficits, face appears symmetric, tongue and palate midline. Motor exam: Antigravity in all fours without vertical drift. Sensory exam intact to light touch all over Coordination: No dysmetria on examination NIH stroke scale-2.  Labs I have reviewed labs in epic and the results pertinent to this consultation are:  CBC    Component Value Date/Time   WBC 8.0 09/03/2019 1410   RBC 3.79 (L) 09/03/2019 1410   HGB 11.6 (L) 09/03/2019 1414   HCT 34.0 (L) 09/03/2019 1414   PLT 167 09/03/2019 1410   MCV 101.6 (H) 09/03/2019 1410   MCH 32.5 09/03/2019 1410   MCHC 31.9 09/03/2019 1410   RDW 14.4 09/03/2019 1410   LYMPHSABS 1.2 09/03/2019 1410   MONOABS 0.7 09/03/2019 1410   EOSABS 0.5 09/03/2019 1410   BASOSABS 0.1 09/03/2019 1410    CMP     Component Value Date/Time   NA 140 09/03/2019 1414   K 4.9 09/03/2019 1414   CL 109 09/03/2019 1414   CO2 28 06/27/2019 0439   GLUCOSE 88 09/03/2019 1414   BUN 30 (H) 09/03/2019 1414   CREATININE 1.40 (H) 09/03/2019 1414   CALCIUM 9.0 06/27/2019 0439   PROT 6.3 (L) 06/21/2019 0705   ALBUMIN 3.6 06/21/2019 0705   AST 38 06/21/2019 0705   ALT 16 06/21/2019 0705   ALKPHOS 57 06/21/2019 0705   BILITOT 1.3 (H) 06/21/2019 0705   GFRNONAA >60 06/27/2019 0439   GFRAA >60 06/27/2019 0439   Imaging I have reviewed the images obtained:  CT-scan of the brain.-Aspects 10, no bleed.  Old right hemispheric stroke. CTA head and neck-35% stenosis of the proximal right ICA.  Mild atherosclerotic disease of left carotid bifurcation without significant stenosis.  Both ports are patent to the basilar without  stenosis.  No emergent LVO.  Assessment: 86 year old male with above past medical history presenting for sudden onset of slurred speech and confusion-on examination has some dysarthria and aphasia. Has had a history of preceding decreased appetite but no fevers chills or any infectious symptoms. Head CT negative for acute bleed I had a detailed discussion with his wife and him regarding symptoms being consistent with possible stroke and him being in the window for IV TPA, offered IV TPA with detailed discussion of risks and benefits-including that of 6% chance of bleeding with IV TPA. Both the patient and wife understand the risks and benefits and wanted to proceed with IV TPA which was administered in the ED. At this time, differentials do include stroke but other differentials to consider also are toxic metabolic encephalopathy in the setting of acutely deranged renal function and possible UTI.  Plan: Acute Ischemic Stroke Acuity: Acute Current Suspected Etiology: Continue Evaluation:  -Admit to: NICU -Hold Aspirin until 24 hour post tPA neuroimaging is stable and without evidence of bleeding -Blood pressure control, goal of SYS <180 -MRI/ECHO/A1C/Lipid panel. -Hyperglycemia management per SSI to maintain glucose 140-180mg /dL. -PT/OT/ST therapies and recommendations when able  CNS -Close neuro monitoring  Dysarthria Dysphagia following cerebral infarction  -NPO until cleared by speech -ST  Toxic encephalopathy -Correct metabolic causes incl. renal dysfunction -Monitor  RESP No acute changes  CV Essential (primary) hypertension Hypertensive Urgency -Aggressive BP control, goal SBP < 180 -Careful with labetalol given bradycardia, cleviprex  Bradycardia -Baseline -Twelve-lead EKG  Hyperlipidemia, unspecified  - Statin for goal LDL < 70  HEME Iron Deficiency Anemia -Transfuse for hgb < 7  GI/GU AKI on CKD -500 cc  saline bolus -75 cc an hour of normal  saline  Fluid/Electrolyte Disorders Check labs in replete as necessary  ID Possible Aspiration PNA -CXR -NPO  Possible UTI -Check urinalysis   Prophylaxis DVT:scd   GI: ppi Bowel:doc/senna   Diet: NPO until cleared by speech  Code Status: DNR --confirmed by wife.   THE FOLLOWING  WERE PRESENT ON ADMISSION: Acute ischemic stroke Hypertensive encephalopathy Dysphagia/dysarthria Aphasia Possible aspiration pneumonia Possible UTI CKD 3, AKI.  -- Amie Portland, MD [facility] Pager: [phone] If 7pm to 7am, please call on call as listed on AMION.   CRITICAL CARE ATTESTATION Performed by: Amie Portland, MD Total critical care time: 60 minutes Critical care time was exclusive of separately billable procedures and treating other patients and/or supervising APPs/Residents/Students Critical care was necessary to treat or prevent imminent or life-threatening deterioration due to acute ischemic stroke, IV thrombolysis This patient is critically ill and at significant risk for neurological worsening and/or death and care requires constant monitoring. Critical care was time spent personally by me on the following activities: development of treatment plan with patient and/or surrogate as well as nursing, discussions with consultants, evaluation of patient's response to treatment, examination of patient, obtaining history from patient or surrogate, ordering and performing treatments and interventions, ordering and review of laboratory studies, ordering and review of radiographic studies, pulse oximetry, re-evaluation of patient's condition, participation in multidisciplinary rounds and medical decision making of high complexity in the care of this patient.

## 2019-09-03 NOTE — Code Documentation (Signed)
86 yo male coming from home with complaints of new onset of slurred speech and garbled words at 1030 when he was with his wife. EMS was called and brought patient to the ED. Upon arrival to the ED, EDP activated a Code Stroke.   Stroke Team met patient in room. Taken to CT. CT Head negative for hemorrhage per MD. Initial NIHSS 4 due to left sensory decreased, intermittent neglect, aphasia, and dysarthria. tPA started at 1434. Delayed due to IV access and BP management. CTA completed.   Patient taken back to the ED. VS and mNIHSS completed per tPA protocol. Taken up to [employer]. Handoff given to [place], Therapist, sports

## 2019-09-03 NOTE — ED Triage Notes (Signed)
1400 - wife arrives, states she last saw him normal at 1030. Began to noticed dysphagia and aphasia at 1130. Denies any unilateral weakness or other symptoms

## 2019-09-03 NOTE — Progress Notes (Signed)
PHARMACIST CODE STROKE RESPONSE  Notified to mix tPA at 1425 by Dr. Rory Percy Delivered tPA to RN at 1429  tPA dose = 7.7 mg bolus over 1 minute followed by 69mg  for a total dose of 76.7mg  over 1 hour  Issues/delays encountered (if applicable): [place] PharmD. BCPS 09/03/19 2:31 PM

## 2019-09-03 NOTE — ED Triage Notes (Signed)
Per [facility] pt from home, spouse called 22 for garbled speech starting at 0930 this am.

## 2019-09-03 NOTE — ED Provider Notes (Signed)
[place] EMERGENCY DEPARTMENT Provider Note   CSN: [id number] Arrival date & time: 09/03/19  1340     History Chief Complaint  Patient presents with   Code Stroke    Robert [name] is a 86 y.o. male.  HPI 87 year old male presents with speech change.  According to nurse who spoke to EMS (who is no longer present), the patient had garbled speech with last known normal at 1030.  Upon initial evaluation the patient states he is here for stroke work-up but is not sure what else is going on.  Denies any acute complaints such as headache or chest pain.  Denies feeling weak.  Wife arrived shortly before 2 PM and further history was obtained which indicates that the patient had some low blood pressures at home from the home health nurse.  He went to get a haircut and seemed to be doing okay.  Did not talk much there so she did not hear her speech.  Last known normal is technically around 10:30 AM.  At 1130 was talking to wife and his speech was incomprehensible.  Seems like prior CVA.  Patient does have waxing and waning memory problems but seems to be around baseline.  Otherwise was not ill prior to today.   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   Erectile dysfunction    Per incoming records from [place]   High cholesterol    no meds   History of CVA (cerebrovascular accident)     Per [facility] New Patient Packet   History of kidney stones    History of UTI    Per [facility] New Patient Packet   Hyperkalemia    Per incoming records from [place]   Hypertension    no meds   Left thigh pain    Per incoming records from [place]   Left wrist pain    Per incoming records from [place]   Nephrolithiasis    Per incoming records from [place]   Partial deafness    Per [facility] New Patient Packet   Pneumonia 2013   Per [facility] New Patient Packet   Pneumonia 2021   Per [facility] New Patient Packet   Prostate cancer [facility])    Per [place] New Patient Packet   Pure hypercholesterolemia    Per incoming records from [place]   Sepsis due to other etiology [facility])    Per incoming records from [place]   TIA (transient ischemic attack)    residual right eye weakness and swallowing   TIA (transient ischemic attack) 2012   Per [facility] New Patient Packet   TIA (transient ischemic attack) 2014   Per [facility] New Patient Packet   TIA (transient ischemic attack) 2016   Per [facility] New Patient Packet   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    Patient Active Problem List  Diagnosis Date Noted   Acute ischemic stroke ([place]) 09/03/2019   Bacteremia due to Escherichia coli 06/27/2019   Pressure injury of skin 06/25/2019   Ureteral calculus    Hydronephrosis with urinary obstruction due to ureteral calculus [id number]   Acute metabolic encephalopathy [id number]   ARF (acute renal failure) ([place]) 03/27/2018   Kidney stone 02/19/2018   UTI (urinary tract infection) 02/19/2018   Sepsis ([place]) 02/01/2017   HCAP (healthcare-associated pneumonia) 02/01/2017   Acute on chronic renal failure ([place]) 02/01/2017   CKD (chronic kidney disease) stage 3, GFR 30-59 ml/min 11/08/2016   HTN (hypertension) 10/03/2016   History of CVA (cerebrovascular accident) 10/03/2016   Pure hypercholesterolemia 10/03/2016   Other male erectile dysfunction 06/27/2016   Aortic  atherosclerosis ([place]) 03/01/2016   Vaccine counseling 12/30/2015    Past Surgical History:  Procedure Laterality Date   CATARACT EXTRACTION Bilateral    Per [place] New Patient Packet, [place]   Per [place] New Patient Packet   CYSTOSCOPY W/ RETROGRADES Left 03/23/2018   Procedure: CYSTOSCOPY WITH RETROGRADE PYELOGRAM;  Surgeon: Billey Co, MD;  Location: [facility] ORS;  Service: Urology;  Laterality: Left;   CYSTOSCOPY W/ RETROGRADES Left 06/21/2019   Procedure: CYSTOSCOPY WITH RETROGRADE PYELOGRAM;  Surgeon: [name], MD;  Location: [facility] ORS;  Service: Urology;  Laterality: Left;   CYSTOSCOPY WITH STENT PLACEMENT Left 02/20/2018   Procedure: CYSTOSCOPY WITH STENT PLACEMENT;  Surgeon: Billey Co, MD;  Location: [facility] ORS;  Service: Urology;  Laterality: Left;   CYSTOSCOPY WITH STENT PLACEMENT Left 06/21/2019   Procedure: CYSTOSCOPY WITH STENT PLACEMENT;  Surgeon: [name], MD;  Location: [facility] ORS;  Service: Urology;  Laterality: Left;   CYSTOSCOPY/URETEROSCOPY/HOLMIUM LASER/STENT PLACEMENT Left 03/23/2018   Procedure: CYSTOSCOPY/URETEROSCOPY/HOLMIUM LASER/STENT Exchange;  Surgeon: Billey Co, MD;  Location: [facility] ORS;  Service: Urology;  Laterality: Left;  left stent exchange   CYSTOSCOPY/URETEROSCOPY/HOLMIUM LASER/STENT PLACEMENT Left 07/16/2019   Procedure: CYSTOSCOPY/URETEROSCOPY/HOLMIUM LASER/STENT exchange;  Surgeon: [name], MD;  Location: [facility] ORS;  Service: Urology;  Laterality: Left;   KNEE ARTHROSCOPY Right 1998   Per [place] New Patient Packet   LEG SURGERY     broken tib/fib   TOTAL HIP ARTHROPLASTY Bilateral    2007 & 2012       Family History  Adopted: Yes  Problem Relation Age of Onset   Diabetes Daughter    Prostate cancer Neg Hx    Bladder Cancer Neg Hx    Kidney cancer Neg Hx     Social History   Tobacco Use   Smoking status: Former Smoker    Years: 60.00    Types: Pipe    Quit date: 2010     Years since quitting: 11.3   Smokeless tobacco: Never Used  Substance Use Topics   Alcohol use: Yes    Comment: infrequently, glass of wine   Drug use: No    Home Medications Prior to Admission medications   Medication Sig Start Date End Date Taking? Authorizing Provider  acetaminophen (TYLENOL) 325 MG tablet Take 2 tablets (650 mg total) by mouth every 6 (six) hours as needed. Patient taking differently: Take 650 mg by mouth every 6 (six) hours as needed for mild pain.  08/20/19  Yes Lauree Chandler, NP  aluminum hydroxide (DERMAMED) ointment Apply 1 application topically every 12 (twelve) hours as needed (skin breakdown).    Yes [provider]  aspirin EC 81 MG tablet Take 81 mg  by mouth daily.    Yes [provider]  atorvastatin (LIPITOR) 10 MG tablet TAKE 1 TABLET BY MOUTH DAILY Patient taking differently: Take 10 mg by mouth daily.  08/27/19  Yes Lauree Chandler, NP  loratadine (CLARITIN) 10 MG tablet Take 10 mg by mouth daily as needed for allergies.   Yes [provider]  Melatonin 10 MG TABS Take 5-10 mg by mouth at bedtime as needed (sleep).   Yes [provider]  Multiple Vitamin (MULTIVITAMIN WITH MINERALS) TABS tablet Take 1 tablet by mouth daily.   Yes [provider]  Multiple Vitamin (MULTIVITAMIN) tablet Take 1 tablet by mouth daily.   Yes [provider]  Nutritional Supplements (FEEDING SUPPLEMENT, NEPRO CARB STEADY,) LIQD Take 237 mLs by mouth daily.    Yes [provider]  Omega-3 Fatty Acids (OMEGA-3 FISH OIL PO) Take 1 capsule by mouth daily.    Yes [provider]  senna (SENOKOT) 8.6 MG tablet Take 1 tablet by mouth as needed for constipation.    Yes [provider]  tamsulosin (FLOMAX) 0.4 MG CAPS capsule Take 0.4 mg by mouth at bedtime.    Yes [provider]    Allergies    Morphine and related, Sulfamethoxazole-trimethoprim, and Tramadol  Review of Systems     Review of Systems  Constitutional: Negative for fever.  Cardiovascular: Negative for chest pain.  Gastrointestinal: Negative for diarrhea and vomiting.  Neurological: Positive for speech difficulty. Negative for headaches.  All other systems reviewed and are negative.   Physical Exam Updated Vital Signs BP (!) 163/79    Pulse 79    Resp (!) 22    Wt 85.2 kg    BMI 26.20 kg/m   Physical Exam Vitals and nursing note reviewed.  Constitutional:      General: He is not in acute distress.    Appearance: He is well-developed. He is not ill-appearing or diaphoretic.  HENT:     Head: Normocephalic and atraumatic.     Right Ear: External ear normal.     Left Ear: External ear normal.     Nose: Nose normal.  Eyes:     General:        Right eye: No discharge.        Left eye: No discharge.     Extraocular Movements: Extraocular movements intact.  Cardiovascular:     Rate and Rhythm: Normal rate and regular rhythm.     Heart sounds: Normal heart sounds.  Pulmonary:     Effort: Pulmonary effort is normal.     Breath sounds: Normal breath sounds.  Abdominal:     Palpations: Abdomen is soft.     Tenderness: There is no abdominal tenderness.  Musculoskeletal:     Cervical back: Neck supple.  Skin:    General: Skin is warm and dry.  Neurological:     Mental Status: He is alert.     Comments: CN 3-12 grossly intact. Difficult to understand speech. 5/5 strength in all 4 extremities. Grossly normal sensation. Normal finger to nose.   Psychiatric:        Mood and Affect: Mood is not anxious.     ED Results / Procedures / Treatments   Labs (all labs ordered are listed, but only abnormal results are displayed) Labs Reviewed  APTT - Abnormal; Notable for the following components:      Result Value   aPTT 23 (*)    All other components within normal limits  COMPREHENSIVE METABOLIC PANEL - Abnormal; Notable for the following components:   BUN 25 (*)    Creatinine, Ser 1.45 (*)     Total Protein 6.0 (*)    GFR calc non Af Amer 40 (*)    GFR calc Af Amer 47 (*)    All other components within normal limits  CBC WITH DIFFERENTIAL/PLATELET - Abnormal; Notable for the following components:   RBC 3.79 (*)    Hemoglobin 12.3 (*)    HCT 38.5 (*)    MCV 101.6 (*)    All other components within normal limits  I-STAT CHEM 8, ED - Abnormal; Notable for the following components:   BUN 30 (*)    Creatinine, Ser 1.40 (*)    Hemoglobin 11.6 (*)    HCT 34.0 (*)    All other components within normal limits  SARS CORONAVIRUS 2 BY RT PCR (HOSPITAL ORDER, [place] [facility])  ETHANOL  PROTIME-INR  RAPID URINE DRUG SCREEN, HOSP PERFORMED  URINALYSIS, ROUTINE W REFLEX MICROSCOPIC    EKG EKG Interpretation  Date/Time:  Tuesday Sep 03 2019 14:04:38 EDT Ventricular Rate:  60 PR Interval:    QRS Duration: 128 QT Interval:  432 QTC Calculation: 432 R Axis:   -69 Text Interpretation: Sinus rhythm Nonspecific IVCD with LAD Interpretation limited secondary to artifact Confirmed by Sherwood Gambler [phone]) on 09/03/2019 2:06:43 PM   Radiology CT ANGIO HEAD W OR WO CONTRAST  Result Date: 09/03/2019 CLINICAL DATA:  Acute neuro deficit.  Aphasia. EXAM: CT ANGIOGRAPHY HEAD AND NECK TECHNIQUE: Multidetector CT imaging of the head and neck was performed using the standard protocol during bolus administration of intravenous contrast. Multiplanar CT image reconstructions and MIPs were obtained to evaluate the vascular anatomy. Carotid stenosis measurements (when applicable) are obtained utilizing NASCET criteria, using the distal internal carotid diameter as the denominator. CONTRAST:  82mL OMNIPAQUE IOHEXOL 350 MG/ML SOLN COMPARISON:  CT head 09/03/2019 FINDINGS: CTA NECK FINDINGS Aortic arch: Standard branching. Imaged portion shows no evidence of aneurysm or dissection. No significant stenosis of the major arch vessel origins. Atherosclerotic calcification aortic arch. Right  carotid system: Atherosclerotic calcification proximal right internal carotid artery. Minimal lumen diameter 3.1 mm corresponding to 35% diameter stenosis. Left carotid system: Atherosclerotic calcification left carotid bifurcation. There is significant left carotid stenosis. Vertebral arteries: Both vertebral arteries widely patent without significant stenosis. Skeleton: Moderate degenerative changes cervical spine. No acute skeletal abnormality Other neck: Negative for mass or adenopathy. Upper chest: Right apical emphysema with pleuroparenchymal scarring. Review of the MIP images confirms the above findings CTA HEAD FINDINGS Anterior circulation: Atherosclerotic calcification in the cavernous carotid bilaterally. No significant stenosis. Anterior and middle cerebral arteries patent bilaterally without significant stenosis. Posterior circulation: Both vertebral arteries patent to the basilar. PICA patent bilaterally. Basilar widely patent. Superior cerebellar and posterior cerebral arteries patent bilaterally without stenosis. Venous sinuses: Normal venous enhancement. Right transverse sinus dominant. Anatomic variants: None Review of the MIP images confirms the above findings IMPRESSION: 1. 35% diameter stenosis proximal right internal carotid artery. 2. Mild atherosclerotic disease left carotid bifurcation without significant stenosis. 3. Both vertebral arteries patent to the basilar without stenosis. 4. No significant intracranial stenosis or large vessel occlusion. Electronically Signed   By: Franchot Gallo M.D.   On: 09/03/2019 14:59   CT ANGIO NECK W OR WO CONTRAST  Result Date: 09/03/2019 CLINICAL DATA:  Acute neuro deficit.  Aphasia. EXAM: CT ANGIOGRAPHY HEAD AND NECK TECHNIQUE: Multidetector CT imaging of the head  and neck was performed using the standard protocol during bolus administration of intravenous contrast. Multiplanar CT image reconstructions and MIPs were obtained to evaluate the vascular  anatomy. Carotid stenosis measurements (when applicable) are obtained utilizing NASCET criteria, using the distal internal carotid diameter as the denominator. CONTRAST:  65mL OMNIPAQUE IOHEXOL 350 MG/ML SOLN COMPARISON:  CT head 09/03/2019 FINDINGS: CTA NECK FINDINGS Aortic arch: Standard branching. Imaged portion shows no evidence of aneurysm or dissection. No significant stenosis of the major arch vessel origins. Atherosclerotic calcification aortic arch. Right carotid system: Atherosclerotic calcification proximal right internal carotid artery. Minimal lumen diameter 3.1 mm corresponding to 35% diameter stenosis. Left carotid system: Atherosclerotic calcification left carotid bifurcation. There is significant left carotid stenosis. Vertebral arteries: Both vertebral arteries widely patent without significant stenosis. Skeleton: Moderate degenerative changes cervical spine. No acute skeletal abnormality Other neck: Negative for mass or adenopathy. Upper chest: Right apical emphysema with pleuroparenchymal scarring. Review of the MIP images confirms the above findings CTA HEAD FINDINGS Anterior circulation: Atherosclerotic calcification in the cavernous carotid bilaterally. No significant stenosis. Anterior and middle cerebral arteries patent bilaterally without significant stenosis. Posterior circulation: Both vertebral arteries patent to the basilar. PICA patent bilaterally. Basilar widely patent. Superior cerebellar and posterior cerebral arteries patent bilaterally without stenosis. Venous sinuses: Normal venous enhancement. Right transverse sinus dominant. Anatomic variants: None Review of the MIP images confirms the above findings IMPRESSION: 1. 35% diameter stenosis proximal right internal carotid artery. 2. Mild atherosclerotic disease left carotid bifurcation without significant stenosis. 3. Both vertebral arteries patent to the basilar without stenosis. 4. No significant intracranial stenosis or large  vessel occlusion. Electronically Signed   By: Franchot Gallo M.D.   On: 09/03/2019 14:59   CT HEAD CODE STROKE WO CONTRAST  Result Date: 09/03/2019 CLINICAL DATA:  Code stroke. Neuro deficit, acute, stroke suspected. Additional history provided: Aphasia, last known well 10:30 a.m. EXAM: CT HEAD WITHOUT CONTRAST TECHNIQUE: Contiguous axial images were obtained from the base of the skull through the vertex without intravenous contrast. COMPARISON:  Prior head CT examinations 06/21/2017 and earlier. FINDINGS: Brain: The exam is mildly motion degraded. Redemonstrated remote cortical/subcortical right parietal lobe infarct. Ill-defined hypoattenuation within the cerebral white matter is nonspecific, but consistent with chronic small vessel ischemic disease. Findings are similar to prior head CT 06/21/2017. Stable, moderate generalized parenchymal atrophy. There is no acute intracranial hemorrhage. No acute demarcated cortical infarct is identified No extra-axial fluid collection. No evidence of intracranial mass. No midline shift. Vascular: No hyperdense vessel.  Atherosclerotic calcifications. Skull: Normal. Negative for fracture or focal lesion. Sinuses/Orbits: Visualized orbits show no acute finding. Mild ethmoid sinus mucosal thickening. No significant mastoid effusion. ASPECTS [facility] Stroke Program Early CT Score) - Ganglionic level infarction (caudate, lentiform nuclei, internal capsule, insula, M1-M3 cortex): 7 - Supraganglionic infarction (M4-M6 cortex): 3 Total score (0-10 with 10 being normal): 10 IMPRESSION: 1. Mildly motion degraded exam. 2. No evidence of acute intracranial abnormality.  ASPECTS is 10. 3. Redemonstrated chronic cortical/subcortical right parietal lobe infarct. 4. Stable generalized parenchymal atrophy and chronic small vessel ischemic disease. Electronically Signed   By: Kellie Simmering DO   On: 09/03/2019 14:31    Procedures .Critical Care Performed by: Sherwood Gambler,  MD Authorized by: Sherwood Gambler, MD   Critical care provider statement:    Critical care time (minutes):  35   Critical care time was exclusive of:  Separately billable procedures and treating other patients   Critical care was necessary to treat or prevent imminent  or life-threatening deterioration of the following conditions:  CNS failure or compromise   Critical care was time spent personally by me on the following activities:  Discussions with consultants, evaluation of patient's response to treatment, examination of patient, ordering and performing treatments and interventions, ordering and review of laboratory studies, ordering and review of radiographic studies, pulse oximetry, re-evaluation of patient's condition, obtaining history from patient or surrogate and review of old charts   (including critical care time)  Medications Ordered in ED Medications   stroke: mapping our early stages of recovery book (has no administration in time range)  0.9 %  sodium chloride infusion (has no administration in time range)  acetaminophen (TYLENOL) tablet 650 mg (has no administration in time range)    Or  acetaminophen (TYLENOL) 160 MG/5ML solution 650 mg (has no administration in time range)    Or  acetaminophen (TYLENOL) suppository 650 mg (has no administration in time range)  senna-docusate (Senokot-S) tablet 1 tablet (has no administration in time range)  pantoprazole (PROTONIX) injection 40 mg (has no administration in time range)  labetalol (NORMODYNE) injection 20 mg (has no administration in time range)    And  clevidipine (CLEVIPREX) infusion 0.5 mg/mL (0 mg/hr Intravenous Stopped 09/03/19 1455)  alteplase (ACTIVASE) 1 mg/mL infusion 76.7 mg (76.7 mg Intravenous New Bag/Given 09/03/19 1434)    Followed by  0.9 %  sodium chloride infusion (has no administration in time range)  sodium chloride 0.9 % bolus 500 mL (500 mLs Intravenous New Bag/Given 09/03/19 1455)  iohexol (OMNIPAQUE) 350  MG/ML injection 75 mL (75 mLs Intravenous Contrast Given 09/03/19 1434)    ED Course  I have reviewed the triage vital signs and the nursing notes.  Pertinent labs & imaging results that were available during my care of the patient were reviewed by me and considered in my medical decision making (see chart for details).  Clinical Course as of Sep 03 1506  Tue Sep 03, 2019  1400 Wife has now arrived and indicates that patient was last normal at 1030 while getting hair cut. However at 11:30 his speech seemed garbled. Didn't know how to use a remote. Seems similar to prior CVA/TIA. Unclear at this point if this is CVA vs metabolic, but given he's in the window will call code stroke.    [SG]    Clinical Course User Index [SG] Sherwood Gambler, MD   MDM Rules/Calculators/A&P                      Patient presents with acute strokelike symptoms.  Continues to have trouble speaking.  Neurology has consulted and he was taken quickly to CT.  Based on negative noncontrasted CT, neurology has offered TPA and patient/wife have agreed.  He will go to the neuro ICU. Final Clinical Impression(s) / ED Diagnoses Final diagnoses:  Acute ischemic stroke [facility])    Rx / DC Orders ED Discharge Orders    None       Sherwood Gambler, MD 09/03/19 [phone]

## 2019-09-03 NOTE — ED Notes (Signed)
ACTIVATED CODE STROKE 

## 2019-09-04 ENCOUNTER — Inpatient Hospital Stay (HOSPITAL_COMMUNITY): Payer: Medicare PPO

## 2019-09-04 DIAGNOSIS — R410 Disorientation, unspecified: Secondary | ICD-10-CM

## 2019-09-04 DIAGNOSIS — G459 Transient cerebral ischemic attack, unspecified: Secondary | ICD-10-CM

## 2019-09-04 DIAGNOSIS — I639 Cerebral infarction, unspecified: Secondary | ICD-10-CM

## 2019-09-04 DIAGNOSIS — R4189 Other symptoms and signs involving cognitive functions and awareness: Secondary | ICD-10-CM

## 2019-09-04 DIAGNOSIS — Z8673 Personal history of transient ischemic attack (TIA), and cerebral infarction without residual deficits: Secondary | ICD-10-CM

## 2019-09-04 DIAGNOSIS — I6389 Other cerebral infarction: Secondary | ICD-10-CM

## 2019-09-04 LAB — LIPID PANEL
Cholesterol: 134 mg/dL (ref 0–200)
HDL: 57 mg/dL (ref >40)
LDL Cholesterol: 65 mg/dL (ref 0–99)
Total CHOL/HDL Ratio: 2.4 RATIO
Triglycerides: 60 mg/dL (ref <150)
VLDL: 12 mg/dL (ref 0–40)

## 2019-09-04 LAB — ECHOCARDIOGRAM COMPLETE
Height: 71 in
Weight: 3005.31 oz

## 2019-09-04 MED ORDER — LABETALOL HCL 5 MG/ML IV SOLN
10.0000 mg | INTRAVENOUS | Status: DC | PRN
  Administered 2019-09-04: 10 mg via INTRAVENOUS
  Filled 2019-09-04: qty 4

## 2019-09-04 MED ORDER — AMLODIPINE BESYLATE 10 MG PO TABS
10.0000 mg | ORAL_TABLET | Freq: Every day | ORAL | Status: DC
  Administered 2019-09-04 – 2019-09-11 (×7): 10 mg via ORAL
  Filled 2019-09-04 (×8): qty 1

## 2019-09-04 MED ORDER — QUETIAPINE FUMARATE 50 MG PO TABS
75.0000 mg | ORAL_TABLET | Freq: Once | ORAL | Status: AC
  Administered 2019-09-04: 75 mg via ORAL
  Filled 2019-09-04 (×2): qty 1

## 2019-09-04 MED ORDER — QUETIAPINE FUMARATE 25 MG PO TABS
25.0000 mg | ORAL_TABLET | Freq: Every day | ORAL | Status: DC
  Administered 2019-09-04: 25 mg via ORAL
  Filled 2019-09-04 (×2): qty 1

## 2019-09-04 MED ORDER — ATORVASTATIN CALCIUM 10 MG PO TABS
10.0000 mg | ORAL_TABLET | Freq: Every day | ORAL | Status: DC
  Administered 2019-09-04 – 2019-09-11 (×7): 10 mg via ORAL
  Filled 2019-09-04 (×7): qty 1

## 2019-09-04 MED ORDER — PERFLUTREN LIPID MICROSPHERE
1.0000 mL | INTRAVENOUS | Status: AC | PRN
  Administered 2019-09-04: 2 mL via INTRAVENOUS
  Filled 2019-09-04: qty 10

## 2019-09-04 MED ORDER — PANTOPRAZOLE SODIUM 40 MG PO TBEC
40.0000 mg | DELAYED_RELEASE_TABLET | Freq: Every day | ORAL | Status: DC
  Administered 2019-09-04 – 2019-09-11 (×7): 40 mg via ORAL
  Filled 2019-09-04 (×8): qty 1

## 2019-09-04 MED ORDER — TAMSULOSIN HCL 0.4 MG PO CAPS
0.4000 mg | ORAL_CAPSULE | Freq: Every day | ORAL | Status: DC
  Administered 2019-09-04 – 2019-09-10 (×6): 0.4 mg via ORAL
  Filled 2019-09-04 (×8): qty 1

## 2019-09-04 MED ORDER — LABETALOL HCL 5 MG/ML IV SOLN: 10.0000 mg | INTRAVENOUS | Status: DC | PRN

## 2019-09-04 MED ORDER — ASPIRIN EC 81 MG PO TBEC
81.0000 mg | DELAYED_RELEASE_TABLET | Freq: Every day | ORAL | Status: DC
  Administered 2019-09-04 – 2019-09-06 (×3): 81 mg via ORAL
  Filled 2019-09-04 (×4): qty 1

## 2019-09-04 MED ORDER — NEPRO/CARBSTEADY PO LIQD
237.0000 mL | Freq: Two times a day (BID) | ORAL | Status: DC
  Administered 2019-09-04 – 2019-09-09 (×6): 237 mL via ORAL

## 2019-09-04 MED ORDER — ADULT MULTIVITAMIN W/MINERALS CH
1.0000 | ORAL_TABLET | Freq: Every day | ORAL | Status: DC
  Administered 2019-09-04 – 2019-09-11 (×7): 1 via ORAL
  Filled 2019-09-04 (×8): qty 1

## 2019-09-04 NOTE — Progress Notes (Signed)
OT Cancellation Note  Patient Details Name: Robert [name] MRN: [id number] DOB: February 10, 1921   Cancelled Treatment:    Reason Eval/Treat Not Completed: Patient not medically ready;Active bedrest order  [place], OT/L   Acute OT Clinical Specialist Acute Rehabilitation Services Pager [phone] Office [phone]  09/04/2019, 9:20 AM

## 2019-09-04 NOTE — Progress Notes (Signed)
  Echocardiogram 2D Echocardiogram has been performed.  Tiyah Zelenak A Hellen Shanley 09/04/2019, 2:22 PM

## 2019-09-04 NOTE — Progress Notes (Signed)
SLP Cancellation Note  Patient Details Name: Robert [name] MRN: [id number] DOB: Aug 11, 1920   Cancelled treatment:       Reason Eval/Treat Not Completed: Other (comment) Pt currently working with PT; other providers also waiting to see him. Will f/u as able.    Osie Bond., M.A. [place] Acute Rehabilitation Services Pager [phone] Office [phone]  09/04/2019, 10:48 AM

## 2019-09-04 NOTE — Procedures (Signed)
Patient Name: Robert [name]  MRN: [id number]  Epilepsy Attending: Lora Havens  Referring Physician/Provider: Dr Rosalin Hawking Date:  09/04/2019 Duration: 23.27 mins  Patient history: 86 y.o. male with history of stroke, TIA, HTN, HLD, Aortic atherosclerosis, ICA stenosis presenting with slurred speech, confusion, and aphasia. EEG to evaluate for seizure.   Level of alertness: Awake   AEDs during EEG study: None  Technical aspects: This EEG study was done with scalp electrodes positioned according to the 10-20 International system of electrode placement. Electrical activity was acquired at a sampling rate of 500Hz  and reviewed with a high frequency filter of 70Hz  and a low frequency filter of 1Hz . EEG data were recorded continuously and digitally stored.   Description: The posterior dominant rhythm consists of 7.5 Hz activity of moderate voltage (25-35 uV) seen predominantly in posterior head regions, symmetric and reactive to eye opening and eye closing.  Hyperventilation and photic stimulation were not performed.     IMPRESSION: This study is within normal limits.  No seizures or epileptiform discharges were seen throughout the recording.  [name] [name]

## 2019-09-04 NOTE — Progress Notes (Addendum)
Patient was becoming increasingly restless and agitated. He was trying to get out of bed, pulling out Ivs and other medical equipment.  RN called on call Stroke MD and was given orders for a Posey belt as well as seroquel.  Stroke MD explained that the patient was sundowning. RN restrained patient with soft waist belt and hand mittens.  RN was told by dayshift nurse that patient has intermittant dysarthria at baseline and that Stroke MD is aware of it.

## 2019-09-04 NOTE — Progress Notes (Signed)
OT Cancellation    09/04/19 1200  OT Visit Information  Last OT Received On 09/04/19  Reason Eval/Treat Not Completed Patient at procedure or test/ unavailable (MRI)  Maurie Boettcher, OT/L   Acute OT Clinical Specialist [place] Pager [phone] Office [phone]

## 2019-09-04 NOTE — Progress Notes (Signed)
Patient transferred from ICU. Patient very agitated, trying to hit and kick staff. MD paged to notify, new orders received.

## 2019-09-04 NOTE — Progress Notes (Signed)
STROKE TEAM PROGRESS NOTE   INTERVAL HISTORY Wife and RN are at the bedside. Pt sitting in bed, awake alert, hard of hearing but orientated. Wife stated that pt this morning had hallucination, and pointing to people not in the room. And stated that he never had that before.   Wife stated that pt had TIA 10 years ago with speech difficulty, lasted about 10 hours and resolved. About 6 weeks ago he had kidney stone, UTI, sepsis and was treated in the hospital, he had no hallucinations at that time. He was discharged to SNF at [facility] but he did not like SNF and now back to independent living with wife and home health PT/OT. For the last one year, wife and daughter noted intermittent slurry speech but not like yesterday when he had severe slurry speech and did not make sense. The speech difficulty now seems resolved over night.    Vitals:   09/04/19 0515 09/04/19 0530 09/04/19 0600 09/04/19 0700  BP: (!) 165/103 (!) 137/103 (!) 171/83 (!) 166/90  Pulse: (!) 58 (!) 56 (!) 59 (!) 132  Resp: 17 19 (!) 21 18  Temp:      TempSrc:      SpO2: 96% 94% 95% (!) 79%  Weight:        CBC:  Recent Labs  Lab 09/03/19 1410 09/03/19 1414  WBC 8.0  --   NEUTROABS 5.6  --   HGB 12.3* 11.6*  HCT 38.5* 34.0*  MCV 101.6*  --   PLT 167  --     Basic Metabolic Panel:  Recent Labs  Lab 09/03/19 1410 09/03/19 1414  NA 143 140  K 5.0 4.9  CL 109 109  CO2 22  --   GLUCOSE 91 88  BUN 25* 30*  CREATININE 1.45* 1.40*  CALCIUM 9.2  --    Lipid Panel:     Component Value Date/Time   CHOL 134 09/04/2019 0353   TRIG 60 09/04/2019 0353   HDL 57 09/04/2019 0353   CHOLHDL 2.4 09/04/2019 0353   VLDL 12 09/04/2019 0353   LDLCALC 65 09/04/2019 0353   HgbA1c: No results found for: HGBA1C Urine Drug Screen:     Component Value Date/Time   LABOPIA NONE DETECTED 09/03/2019 1636   COCAINSCRNUR NONE DETECTED 09/03/2019 1636   LABBENZ NONE DETECTED 09/03/2019 1636   AMPHETMU NONE DETECTED 09/03/2019  1636   THCU NONE DETECTED 09/03/2019 1636   LABBARB NONE DETECTED 09/03/2019 1636    Alcohol Level     Component Value Date/Time   ETH <10 09/03/2019 1410    IMAGING past 24 hours CT ANGIO HEAD W OR WO CONTRAST  Result Date: 09/03/2019 CLINICAL DATA:  Acute neuro deficit.  Aphasia. EXAM: CT ANGIOGRAPHY HEAD AND NECK TECHNIQUE: Multidetector CT imaging of the head and neck was performed using the standard protocol during bolus administration of intravenous contrast. Multiplanar CT image reconstructions and MIPs were obtained to evaluate the vascular anatomy. Carotid stenosis measurements (when applicable) are obtained utilizing NASCET criteria, using the distal internal carotid diameter as the denominator. CONTRAST:  77mL OMNIPAQUE IOHEXOL 350 MG/ML SOLN COMPARISON:  CT head 09/03/2019 FINDINGS: CTA NECK FINDINGS Aortic arch: Standard branching. Imaged portion shows no evidence of aneurysm or dissection. No significant stenosis of the major arch vessel origins. Atherosclerotic calcification aortic arch. Right carotid system: Atherosclerotic calcification proximal right internal carotid artery. Minimal lumen diameter 3.1 mm corresponding to 35% diameter stenosis. Left carotid system: Atherosclerotic calcification left carotid bifurcation. There is significant  left carotid stenosis. Vertebral arteries: Both vertebral arteries widely patent without significant stenosis. Skeleton: Moderate degenerative changes cervical spine. No acute skeletal abnormality Other neck: Negative for mass or adenopathy. Upper chest: Right apical emphysema with pleuroparenchymal scarring. Review of the MIP images confirms the above findings CTA HEAD FINDINGS Anterior circulation: Atherosclerotic calcification in the cavernous carotid bilaterally. No significant stenosis. Anterior and middle cerebral arteries patent bilaterally without significant stenosis. Posterior circulation: Both vertebral arteries patent to the basilar. PICA  patent bilaterally. Basilar widely patent. Superior cerebellar and posterior cerebral arteries patent bilaterally without stenosis. Venous sinuses: Normal venous enhancement. Right transverse sinus dominant. Anatomic variants: None Review of the MIP images confirms the above findings IMPRESSION: 1. 35% diameter stenosis proximal right internal carotid artery. 2. Mild atherosclerotic disease left carotid bifurcation without significant stenosis. 3. Both vertebral arteries patent to the basilar without stenosis. 4. No significant intracranial stenosis or large vessel occlusion. Electronically Signed   By: Franchot Gallo M.D.   On: 09/03/2019 14:59   CT ANGIO NECK W OR WO CONTRAST  Result Date: 09/03/2019 CLINICAL DATA:  Acute neuro deficit.  Aphasia. EXAM: CT ANGIOGRAPHY HEAD AND NECK TECHNIQUE: Multidetector CT imaging of the head and neck was performed using the standard protocol during bolus administration of intravenous contrast. Multiplanar CT image reconstructions and MIPs were obtained to evaluate the vascular anatomy. Carotid stenosis measurements (when applicable) are obtained utilizing NASCET criteria, using the distal internal carotid diameter as the denominator. CONTRAST:  42mL OMNIPAQUE IOHEXOL 350 MG/ML SOLN COMPARISON:  CT head 09/03/2019 FINDINGS: CTA NECK FINDINGS Aortic arch: Standard branching. Imaged portion shows no evidence of aneurysm or dissection. No significant stenosis of the major arch vessel origins. Atherosclerotic calcification aortic arch. Right carotid system: Atherosclerotic calcification proximal right internal carotid artery. Minimal lumen diameter 3.1 mm corresponding to 35% diameter stenosis. Left carotid system: Atherosclerotic calcification left carotid bifurcation. There is significant left carotid stenosis. Vertebral arteries: Both vertebral arteries widely patent without significant stenosis. Skeleton: Moderate degenerative changes cervical spine. No acute skeletal  abnormality Other neck: Negative for mass or adenopathy. Upper chest: Right apical emphysema with pleuroparenchymal scarring. Review of the MIP images confirms the above findings CTA HEAD FINDINGS Anterior circulation: Atherosclerotic calcification in the cavernous carotid bilaterally. No significant stenosis. Anterior and middle cerebral arteries patent bilaterally without significant stenosis. Posterior circulation: Both vertebral arteries patent to the basilar. PICA patent bilaterally. Basilar widely patent. Superior cerebellar and posterior cerebral arteries patent bilaterally without stenosis. Venous sinuses: Normal venous enhancement. Right transverse sinus dominant. Anatomic variants: None Review of the MIP images confirms the above findings IMPRESSION: 1. 35% diameter stenosis proximal right internal carotid artery. 2. Mild atherosclerotic disease left carotid bifurcation without significant stenosis. 3. Both vertebral arteries patent to the basilar without stenosis. 4. No significant intracranial stenosis or large vessel occlusion. Electronically Signed   By: Franchot Gallo M.D.   On: 09/03/2019 14:59   DG Chest Portable 1 View  Result Date: 09/03/2019 CLINICAL DATA:  Acute stroke. Altered mental status. EXAM: PORTABLE CHEST 1 VIEW COMPARISON:  06/26/2019 FINDINGS: Stable cardiomegaly. Aortic atherosclerosis noted. Mild airspace disease is seen in both lung bases, but shows significant improvement since previous study. No new or worsening areas of pulmonary opacity are seen. Mild elevation of left hemidiaphragm is unchanged. IMPRESSION: 1. Mild bibasilar airspace disease, with significant improvement since prior exam. 2. Stable cardiomegaly. Electronically Signed   By: Marlaine Hind M.D.   On: 09/03/2019 17:30   CT HEAD CODE STROKE WO  CONTRAST  Result Date: 09/03/2019 CLINICAL DATA:  Code stroke. Neuro deficit, acute, stroke suspected. Additional history provided: Aphasia, last known well 10:30 a.m.  EXAM: CT HEAD WITHOUT CONTRAST TECHNIQUE: Contiguous axial images were obtained from the base of the skull through the vertex without intravenous contrast. COMPARISON:  Prior head CT examinations 06/21/2017 and earlier. FINDINGS: Brain: The exam is mildly motion degraded. Redemonstrated remote cortical/subcortical right parietal lobe infarct. Ill-defined hypoattenuation within the cerebral white matter is nonspecific, but consistent with chronic small vessel ischemic disease. Findings are similar to prior head CT 06/21/2017. Stable, moderate generalized parenchymal atrophy. There is no acute intracranial hemorrhage. No acute demarcated cortical infarct is identified No extra-axial fluid collection. No evidence of intracranial mass. No midline shift. Vascular: No hyperdense vessel.  Atherosclerotic calcifications. Skull: Normal. Negative for fracture or focal lesion. Sinuses/Orbits: Visualized orbits show no acute finding. Mild ethmoid sinus mucosal thickening. No significant mastoid effusion. ASPECTS [facility] Stroke Program Early CT Score) - Ganglionic level infarction (caudate, lentiform nuclei, internal capsule, insula, M1-M3 cortex): 7 - Supraganglionic infarction (M4-M6 cortex): 3 Total score (0-10 with 10 being normal): 10 IMPRESSION: 1. Mildly motion degraded exam. 2. No evidence of acute intracranial abnormality.  ASPECTS is 10. 3. Redemonstrated chronic cortical/subcortical right parietal lobe infarct. 4. Stable generalized parenchymal atrophy and chronic small vessel ischemic disease. Electronically Signed   By: Kellie Simmering DO   On: 09/03/2019 14:31    PHYSICAL EXAM  Temp:  [97.3 F (36.3 C)-98.1 F (36.7 C)] 97.5 F (36.4 C) (05/19 0400) Pulse Rate:  [56-132] 132 (05/19 0700) Resp:  [13-27] 18 (05/19 0700) BP: (122-195)/(58-111) 166/90 (05/19 0700) SpO2:  [79 %-100 %] 79 % (05/19 0700) Weight:  [85.2 kg] 85.2 kg (05/18 1400)  General - Well nourished, well developed, in no apparent  distress.  Ophthalmologic - fundi not visualized due to noncooperation.  Cardiovascular - Regular rhythm and rate.  Mental Status -  Level of arousal and orientation to time, place, and person were intact. Language including expression, naming, repetition, comprehension was assessed and found intact. Moderate dysarthria  Cranial Nerves II - XII - II - Visual field intact OU. Right eye able to see HW III, IV, VI - Extraocular movements intact. V - Facial sensation intact bilaterally. VII - Facial movement intact bilaterally. VIII - Hearing & vestibular intact bilaterally. X - Palate elevates symmetrically. XI - Chin turning & shoulder shrug intact bilaterally. XII - Tongue protrusion intact.  Motor Strength - The patient's strength was normal in all extremities and pronator drift was absent.  Bulk was normal and fasciculations were absent.   Motor Tone - Muscle tone was assessed at the neck and appendages and was normal.  Reflexes - The patient's reflexes were symmetrical in all extremities and he had no pathological reflexes.  Sensory - Light touch, temperature/pinprick were assessed and were symmetrical.    Coordination - The patient had normal movements in the hands with no ataxia or dysmetria, however slower on the left than right.  Tremor with action, present bilateral but more on the left than right.  Gait and Station - deferred.   ASSESSMENT/PLAN Robert [name] is a 86 y.o. male with history of stroke, TIA, HTN, HLD, Aortic atherosclerosis, ICA stenosis presenting with slurred speech, confusion, and aphasia. Received IV tPA 09/03/2019 at 1434.   Stroke-like episode s/p tPA - MRI neg - similar episode 2012, 2013, 2016 and 2019, MRI all negative, diagnosed with TIAs  Code Stroke CT head No acute  abnormality. Small vessel disease. Atrophy. Old R parietal infarct. ASPECTS 10.     CTA head & neck R ICA 35% stenosis. Mild atherosclerosis L ICA bifurcation.   CUS  unremarkable  MRI  No acute infarct. Small vessel disease. Old R parietal infarct.  CT head repeat no ICH. Small vessel disease. Atrophy.   2D Echo EF 50-55%  LDL 65  HgbA1c pending   SCDs for VTE prophylaxis  aspirin 81 mg daily prior to admission, now on ASA 81mg     Therapy recommendations:  SNF  Disposition:  pending (lives at [facility])  Cognitive decline Delirium Sundowning  For the last one year, wife and daughter noted intermittent slurry speech  Overnight patient had delirium and hallucination  Today patient also had sundowning and abdomen  Posey belt restraint  Seroquel 25 mg nightly  Hx stroke and TIA  06/2017 - TIA. aphasia and disorientation. Resolved. OP workup w/ B ICA 50% stenosis. AV stenosis. HTN. Continued asa (no plavix d/t falls and concern for bleeding).  12/2014 - Hx stroke w/ residual vision deficits OD. On aspirin. Previously on plavix.   2012, 2013, 2016 - recurrent TIAs with similar presentation as 2019   Hypertension  BP as high as 195/90  Home meds:  None listed  Treated w/ cleviprex, now off  BP goal < 180/105  Put on amlodipine 10 . Long-term BP goal normotensive  Hyperlipidemia  Home meds:  lipitor 10 and fish oil  LDL 65, at goal < 70  Resume Lipitor 10  Continue statin and fish oil at discharge  Other Stroke Risk Factors  Advanced age  Former Cigarette smoker, quit 11 yrs ago  ETOH use, alcohol level <10, advised to drink no more than 2 drink(s) a day  Aortic atherosclerosis.  Other Active Problems  CKD stage III, Cre 1.45-1.40 - on IVF - Cre monitoring  Prostate cancer  Hospital day # 1  This patient is critically ill due to strokelike symptoms status post TPA, hypertensive emergency, needed IV BP meds for BP monitoring, delirium, hallucination, sundowning and at significant risk of neurological worsening, death form stroke, hemorrhage, bleeding, heart failure, hypertensive encephalopathy. This  patient's care requires constant monitoring of vital signs, hemodynamics, respiratory and cardiac monitoring, review of multiple databases, neurological assessment, discussion with family, other specialists and medical decision making of high complexity. I spent 40 minutes of neurocritical care time in the care of this patient. I had long discussion with wife and patient at bedside, updated pt current condition, treatment plan and potential prognosis, and answered all the questions.  They expressed understanding and appreciation.   Rosalin Hawking, MD PhD Stroke Neurology 09/04/2019 6:44 PM   To contact Stroke Continuity provider, please refer to [email or web address] After hours, contact General Neurology

## 2019-09-04 NOTE — Evaluation (Signed)
Speech Language Pathology Evaluation Patient Details Name: Robert [name] MRN: [id number] DOB: 10/20/1920 Today's Date: 09/04/2019 Time: [id number] SLP Time Calculation (min) (ACUTE ONLY): 27 min  Problem List:  Patient Active Problem List   Diagnosis Date Noted  . Acute ischemic stroke ([place]) 09/03/2019  . Bacteremia due to Escherichia coli 06/27/2019  . Pressure injury of skin 06/25/2019  . Ureteral calculus   . Hydronephrosis with urinary obstruction due to ureteral calculus 06/21/2019  . Acute metabolic encephalopathy [id number]  . ARF (acute renal failure) ([place]) 03/27/2018  . Kidney stone 02/19/2018  . UTI (urinary tract infection) 02/19/2018  . Sepsis ([place]) 02/01/2017  . HCAP (healthcare-associated pneumonia) 02/01/2017  . Acute on chronic renal failure ([place]) 02/01/2017  . CKD (chronic kidney disease) stage 3, GFR 30-59 ml/min 11/08/2016  . HTN (hypertension) 10/03/2016  . History of CVA (cerebrovascular accident) 10/03/2016  . Pure hypercholesterolemia 10/03/2016  . Other male erectile dysfunction 06/27/2016  . Aortic atherosclerosis ([place]) 03/01/2016  . Vaccine counseling 12/30/2015   Past Medical History:  Past Medical History:  Diagnosis Date  . Acute bronchitis    Per incoming records from [place]  . Acute constipation    Per incoming records from [place]  . Aortic atherosclerosis ([place]) 03/01/2016  . Arthritis   . Asymptomatic bilateral carotid artery stenosis    Per incoming records from [place]  . Bradycardia    Per incoming records from [place]  . Candidiasis    Per incoming records from [place]  . Cellulitis of right lower extremity    Per incoming records from [place]  . CKD (chronic kidney disease), stage III    Per [place] New Patient Packet  . 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 Erectile dysfunction    Per incoming records from [place]  . High cholesterol    no meds  . History of CVA (cerebrovascular accident)     Per [place] New Patient Packet  . History of kidney stones   . History of UTI    Per [facility] New Patient Packet  . Hyperkalemia    Per incoming records from [place]  . Hypertension    no meds  . Left thigh pain    Per incoming records from [place]  . Left wrist pain    Per incoming records from [place]  . Nephrolithiasis    Per incoming records from [place]  . Partial deafness    Per [facility] New Patient Packet  . Pneumonia 2013   Per [facility] New Patient Packet  . Pneumonia 2021   Per [facility] New Patient Packet  . Prostate cancer [facility])    Per [facility] New Patient Packet  . Pure hypercholesterolemia    Per incoming records from [place]  . Sepsis due to other etiology [facility])    Per incoming records from [place]  . TIA (transient ischemic attack)    residual right eye weakness and swallowing  . TIA (transient ischemic attack) 2012   Per [place] Patient Packet  . TIA (transient ischemic attack) 2014   Per [place] Patient Packet  . TIA (transient ischemic attack) 2016   Per [place] [address] Patient Packet  . 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 CATARACT EXTRACTION Bilateral    Per [place] New Patient Packet, Dr.Thompson   . COLONOSCOPY  1997   Per [facility] New Patient Packet  . CYSTOSCOPY W/ RETROGRADES Left 03/23/2018  Procedure: CYSTOSCOPY WITH RETROGRADE PYELOGRAM;  Surgeon: Billey Co, MD;  Location: [facility] ORS;  Service: Urology;  Laterality: Left;  . CYSTOSCOPY W/ RETROGRADES Left 06/21/2019   Procedure: CYSTOSCOPY WITH RETROGRADE PYELOGRAM;  Surgeon: [name], MD;  Location: [facility] ORS;  Service: Urology;  Laterality: Left;  . CYSTOSCOPY WITH STENT PLACEMENT Left 02/20/2018   Procedure: CYSTOSCOPY WITH STENT PLACEMENT;  Surgeon: Billey Co, MD;  Location: [facility] ORS;  Service: Urology;  Laterality: Left;  . CYSTOSCOPY WITH STENT PLACEMENT Left 06/21/2019   Procedure: CYSTOSCOPY WITH STENT  PLACEMENT;  Surgeon: [name], MD;  Location: [facility] ORS;  Service: Urology;  Laterality: Left;  . CYSTOSCOPY/URETEROSCOPY/HOLMIUM LASER/STENT PLACEMENT Left 03/23/2018   Procedure: CYSTOSCOPY/URETEROSCOPY/HOLMIUM LASER/STENT Exchange;  Surgeon: Billey Co, MD;  Location: [facility] ORS;  Service: Urology;  Laterality: Left;  left stent exchange  . CYSTOSCOPY/URETEROSCOPY/HOLMIUM LASER/STENT PLACEMENT Left 07/16/2019   Procedure: CYSTOSCOPY/URETEROSCOPY/HOLMIUM LASER/STENT exchange;  Surgeon: [name], MD;  Location: [facility] ORS;  Service: Urology;  Laterality: Left;  . KNEE ARTHROSCOPY Right 1998   Per [facility] New Patient Packet  . LEG SURGERY     broken tib/fib  . TOTAL HIP ARTHROPLASTY Bilateral    2007 & 2012   HPI:  86 y.o. male past medical history of aortic atherosclerosis, bradycardia, CKD stage III, history of a prior stroke with residual right eye deficits, relatively recent admission for UTI, nephrolithiasis, history of prostate cancer, multiple TIAs in the past according to the wife, presenting to the ER with slurred speech and confusion. Pt received TPA on 5/18.    Assessment / Plan / Recommendation Clinical Impression  Pt has significant cognitive deficits, and per OT note, family describes him as cognitively intact at baseline. Today he is impulsive with reduced safety awareness, needing frequent redirection not to get out of his chair. He is oriented to person and can tell me that he is here for a stroke, but needs reorientation to location multiple times. He follows one-step commands well but is distractible and tangential in conversation. His speech is mildly slurred although intelligible the majority of the time. Pt would benefit from SLP f/u especially for his cognitive deficits.    SLP Assessment  SLP Recommendation/Assessment: Patient needs continued Speech Lanaguage Pathology Services SLP Visit Diagnosis: Cognitive communication deficit (R41.841)    Follow Up  Recommendations  Inpatient Rehab    Frequency and Duration min 2x/week  2 weeks      SLP Evaluation Cognition  Overall Cognitive Status: Impaired/Different from baseline Arousal/Alertness: Awake/alert Orientation Level: Oriented to person;Disoriented to place;Disoriented to time;Oriented to situation Attention: Sustained Sustained Attention: Impaired Sustained Attention Impairment: Verbal basic Memory: Impaired Memory Impairment: Storage deficit;Retrieval deficit;Decreased recall of new information Awareness: Impaired Awareness Impairment: Intellectual impairment;Emergent impairment Problem Solving: Impaired Problem Solving Impairment: Verbal basic;Functional basic Behaviors: Impulsive;Confabulation Safety/Judgment: Impaired       Comprehension  Auditory Comprehension Overall Auditory Comprehension: Impaired Yes/No Questions: Impaired Complex Questions: 50-74% accurate Commands: Within Functional Limits Conversation: Simple Interfering Components: Attention    Expression Expression Primary Mode of Expression: Verbal Verbal Expression Overall Verbal Expression: Impaired(confabulation) Written Expression Dominant Hand: Right   Oral / Motor  Motor Speech Overall Motor Speech: Impaired Articulation: Impaired Level of Impairment: Conversation Intelligibility: Intelligibility reduced Word: 75-100% accurate   GO                     Osie Bond., M.A. [place] [facility] Pager [phone] Office [phone]  09/04/2019, 4:53 PM

## 2019-09-04 NOTE — Progress Notes (Signed)
Initial Nutrition Assessment  DOCUMENTATION CODES:   Not applicable  INTERVENTION:   Liberalize diet to Regular per pt wishes, verbal order from MD  Nepro Shake po BID, each supplement provides 425 kcal and 19 grams protein - pt prefers butter pecan   Magic cup TID with meals, each supplement provides 290 kcal and 9 grams of protein  Reviewed menu with wife. Encouraged ordering preferences.    NUTRITION DIAGNOSIS:   Inadequate oral intake related to poor appetite as evidenced by per patient/family report.  GOAL:   Patient will meet greater than or equal to 90% of their needs  MONITOR:   PO intake, Supplement acceptance  REASON FOR ASSESSMENT:   Malnutrition Screening Tool    ASSESSMENT:   Pt with PMH of aortic atherosclerosis, bradycardia, CKD 3, prior stroke with R eye deficits, multiple TIAs, and recent admission for UTI per wife poor appetite few days to weeks PTA now admitted with stroke symptoms s/p IV tPA.   Pt discussed during ICU rounds and with RN.  Cleviprex stopped this am.   Pt out of room currently. Spoke with wife who provides history. Per wife pt has lost at least 20 lb. She reports usual weight of 200 lb and that he lost down to 170 lb (15% weight loss x 4 months) after fall in January 2021. She reports he has no appetite.  24 recall:  Breakfast: sausage, biscuit, banana, OJ or egg sausage, banana and OJ (biggest meal)  Lunch: soup Dinner: nothing or bites  She reports that pt has a known hx of dysphagia but continues to eat a Regular diet with known risks. She reports that he coughs when eating.  The only supplement pt will consume is Nepro, he prefers butter pecan.   Suspect malnutrition in patient with weight loss and poor intake.   Medications reviewed and include: MVI Labs reviewed    NUTRITION - FOCUSED PHYSICAL EXAM:  Deferred; pt out of room  Diet Order:   Diet Order            Diet regular Room service appropriate? Yes; Fluid  consistency: Thin  Diet effective now              EDUCATION NEEDS:   No education needs have been identified at this time  Skin:  Skin Assessment: Reviewed RN Assessment  Last BM:  unknown  Height:   Ht Readings from Last 1 Encounters:  09/04/19 5\' 11"  (1.803 m)    Weight:   Wt Readings from Last 1 Encounters:  09/03/19 85.2 kg    Ideal Body Weight:  78.1 kg  BMI:  Body mass index is 26.2 kg/m.  Estimated Nutritional Needs:   Kcal:  2000-2200  Protein:  110-120 grams  Fluid:  >2 L/day  Lockie Pares., RD, LDN, CNSC See AMiON for contact information

## 2019-09-04 NOTE — Progress Notes (Addendum)
Occupational Therapy Evaluation Patient Details Name: Robert [name] MRN: [id number] DOB: 1920/11/01 Today's Date: 09/04/2019    History of Present Illness 86 y.o. male past medical history of aortic atherosclerosis, bradycardia, CKD stage III, history of a prior stroke with residual right eye deficits, relatively recent admission for UTI, nephrolithiasis, history of prostate cancer, multiple TIAs in the past according to the wife, presenting to the ER with slurred speech and confusion. Pt received TPA on 5/18.    Clinical Impression   PTA, pt living with wife in [place] at [facility] after recent DC from [facility] rehab and was having HHOT/PT and [place]. Wife states pt was not near his baseline prior to DC form SNF, but he wasn't eating and therefore the facility let him DC home as long as wife had sufficient [place] services set up.  Prior to this admission, pt was able to ambulate with a rollator but required assistance with ADL tasks. Wife states cognition is usually intact. . Pt requires increased assistance and appears to be more confused and hallucinating since earlier PT session this am - appears to be delirious. Pt seeing "cats" in room and "tape" hanging" from ceiling. Pt required Max A +2 for stand pivot transfer due to cognition and deficits listed below. Max A for ADL tasks. Recommend measures to reduce delirium. Recommend staff use Stedy to transfer pt back to bed. Pt will need rehab at SNF. Will follow acutely.     Follow Up Recommendations  SNF;Supervision/Assistance - 24 hour    Equipment Recommendations  3 in 1 bedside commode;Other (comment)(TBA)    Recommendations for Other Services (Palliative Consult) - wife states pt has been declining over the past 2 months and has a poor appetite     Precautions / Restrictions Precautions Precautions: Fall Precaution Comments: impulsive      Mobility Bed Mobility Overal bed mobility: Needs Assistance Bed Mobility: Supine to Sit      Supine to sit: Mod assist     General bed mobility comments: poor motor planning; physical assistance to move to EOB adn prevent fall; poor awareness of space  Transfers Overall transfer level: Needs assistance   Transfers: Sit to/from Stand;Stand Pivot Transfers Sit to Stand: +2 physical assistance;Max assist Stand pivot transfers: +2 physical assistance;Max assist       General transfer comment: Pt unable to achieve full upright posture. Attempted first stand with use of RW which the pt was pulling off of the floor; Max A +2 required to safely transfer to chair; recommend use of Stedy    Balance     Sitting balance-Leahy Scale: Poor       Standing balance-Leahy Scale: Zero                             ADL either performed or assessed with clinical judgement   ADL Overall ADL's : Needs assistance/impaired Eating/Feeding: Minimal assistance Eating/Feeding Details (indicate cue type and reason): wife reports pt has lost over 20 lbs recently adn has had decreased appetite since DC form snf Grooming: Moderate assistance Grooming Details (indicate cue type and reason): difficulty staying on task to complete activitiy Upper Body Bathing: Maximal assistance;Sitting   Lower Body Bathing: Maximal assistance;Bed level   Upper Body Dressing : Moderate assistance;Sitting Upper Body Dressing Details (indicate cue type and reason): pt placing arms through holes and helping to pull up Lower Body Dressing: Total assistance  Toileting- Clothing Manipulation and Hygiene: Total assistance       Functional mobility during ADLs: Maximal assistance;+2 for safety/equipment;Cueing for safety;Cueing for sequencing General ADL Comments: Pt trying to stand without having feet on floor. Unable to complete full upright standing and attempting to sit before making it to the chair.      Vision Baseline Vision/History: Wears glasses Wears Glasses: At all times;Reading  only Additional Comments: pt having visual hallucination     Perception Perception Perception Tested?: (hallucinations)   Praxis      Pertinent Vitals/Pain Pain Assessment: Faces Faces Pain Scale: Hurts a little bit Pain Location: general discomfort Pain Descriptors / Indicators: Discomfort Pain Intervention(s): Limited activity within patient's tolerance     Hand Dominance Right   Extremity/Trunk Assessment Upper Extremity Assessment Upper Extremity Assessment: Generalized weakness(moving BUE spontaneously)   Lower Extremity Assessment Lower Extremity Assessment: Defer to PT evaluation   Cervical / Trunk Assessment Cervical / Trunk Assessment: Kyphotic   Communication Communication Communication: HOH   Cognition Arousal/Alertness: Awake/alert Behavior During Therapy: Impulsive;Restless Overall Cognitive Status: Impaired/Different from baseline Area of Impairment: Orientation;Attention;Memory;Following commands;Safety/judgement;Awareness;Problem solving                 Orientation Level: Disoriented to;Time;Situation;Place Current Attention Level: Focused Memory: Decreased recall of precautions;Decreased short-term memory Following Commands: Follows one step commands inconsistently Safety/Judgement: Decreased awareness of safety;Decreased awareness of deficits Awareness: Intellectual Problem Solving: Slow processing;Difficulty sequencing;Decreased initiation;Requires verbal cues;Requires tactile cues General Comments: pt appaerntly confused and hallucinating at times   General Comments       Exercises     Shoulder Instructions      Home Living Family/patient expects to be discharged to:: Skilled nursing facility                                        Prior Functioning/Environment Level of Independence: Needs assistance  Gait / Transfers Assistance Needed: working with PT at home, ambulating with Rollator ADL's / Homemaking Assistance  Needed: Pt needs assistances with ADLs.  Especially bathing.   Comments: utilizing transport chair for community mobility; wife states shehas helped pt with bath x 1 yr; Wife states pt has not recovered since being hospitalized for UTI @ 2 months ago        OT Problem List: Decreased strength;Decreased activity tolerance;Impaired balance (sitting and/or standing);Impaired vision/perception;Decreased coordination;Decreased cognition;Decreased safety awareness;Decreased knowledge of use of DME or AE;Impaired UE functional use;Pain      OT Treatment/Interventions: Self-care/ADL training;Therapeutic exercise;Neuromuscular education;DME and/or AE instruction;Therapeutic activities;Cognitive remediation/compensation;Visual/perceptual remediation/compensation;Patient/family education;Balance training    OT Goals(Current goals can be found in the care plan section) Acute Rehab OT Goals Patient Stated Goal: to get some tape OT Goal Formulation: With family Time For Goal Achievement: 09/18/19 Potential to Achieve Goals: Fair  OT Frequency: Min 2X/week   Barriers to D/C:            Co-evaluation              AM-PAC OT "6 Clicks" Daily Activity     Outcome Measure Help from another person eating meals?: A Little Help from another person taking care of personal grooming?: A Lot Help from another person toileting, which includes using toliet, bedpan, or urinal?: Total Help from another person bathing (including washing, rinsing, drying)?: A Lot Help from another person to put on and taking off regular upper body clothing?: A Lot Help from  another person to put on and taking off regular lower body clothing?: Total 6 Click Score: 11   End of Session Equipment Utilized During Treatment: Gait belt Nurse Communication: Mobility status;Need for lift equipment(recommend Stedy; Close S while OOB in chair; secretary aware)  Activity Tolerance: Patient tolerated treatment well Patient left: in  chair;with call bell/phone within reach;with chair alarm set;with family/visitor present  OT Visit Diagnosis: Unsteadiness on feet (R26.81);Other abnormalities of gait and mobility (R26.89);Muscle weakness (generalized) (M62.81);History of falling (Z91.81);Other symptoms and signs involving cognitive function;Pain Pain - part of body: (generalized; hx of back pain)                Time: 1424-1510 OT Time Calculation (min): 46 min Charges:  OT General Charges $OT Visit: 1 Visit OT Evaluation $OT Eval Moderate Complexity: 1 Mod OT Treatments $Self Care/Home Management : 23-37 mins  Maurie Boettcher, OT/L   Acute OT Clinical Specialist [place] Pager [phone] Office [phone]   [facility] 09/04/2019, 4:16 PM

## 2019-09-04 NOTE — Evaluation (Signed)
Physical Therapy Evaluation Patient Details Name: Robert [name] MRN: [id number] DOB: May 10, 1920 Today's Date: 09/04/2019   History of Present Illness  86 y.o. male past medical history of aortic atherosclerosis, bradycardia, CKD stage III, history of a prior stroke with residual right eye deficits, relatively recent admission for UTI, nephrolithiasis, history of prostate cancer, multiple TIAs in the past according to the wife, presenting to the ER with slurred speech and confusion. Pt received TPA on 5/18. Pt ambulates with a RW, requires assistance for bathing and ADLs.  Clinical Impression  Pt presents to PT with deficits in functional mobility, gait, balance, cognition, strength, power, and endurance. PT evaluation limited by multiple procedures, however pt is able to perform bed mobility, transfer, and ambulate a few feet with PT assistance. Pt is generally weak, requiring physical assistance to stand and to maintain balance when out of bed. Pt's spouse reports a recent history of falls and that she is not able to provide much physical assistance. Pt will benefit from gait assessment with RW vs Rollator as he typically utilizes a Rollator for UE support and performance may improve with this. PT currently recommends SNF at this time due to falls history and current weakness, imbalance, and cognitive deficits.    Follow Up Recommendations SNF;Supervision/Assistance - 24 hour    Equipment Recommendations  (defer to post-acute setting)    Recommendations for Other Services       Precautions / Restrictions Precautions Precautions: Fall Restrictions Weight Bearing Restrictions: No      Mobility  Bed Mobility Overal bed mobility: Needs Assistance Bed Mobility: Supine to Sit     Supine to sit: HOB elevated;Min guard     General bed mobility comments: use of bed rails  Transfers Overall transfer level: Needs assistance Equipment used: 1 person hand held assist Transfers: Sit  to/from Stand Sit to Stand: Mod assist            Ambulation/Gait Ambulation/Gait assistance: Min assist Gait Distance (Feet): 2 Feet(2 ft forward and 2 ft backward) Assistive device: 1 person hand held assist Gait Pattern/deviations: Step-to pattern Gait velocity: reduced Gait velocity interpretation: <1.31 ft/sec, indicative of household ambulator General Gait Details: pt with short step to gait taking 2 steps forward and back, increased forward trunk lean  Stairs            Wheelchair Mobility    Modified Rankin (Stroke Patients Only)       Balance Overall balance assessment: Needs assistance Sitting-balance support: Single extremity supported;Feet supported Sitting balance-Leahy Scale: Fair Sitting balance - Comments: minG at edge of bed   Standing balance support: Bilateral upper extremity supported Standing balance-Leahy Scale: Poor Standing balance comment: minA to maintain static standing balance                             Pertinent Vitals/Pain Pain Assessment: No/denies pain    Home Living Family/patient expects to be discharged to:: Private residence(independent living facility) Living Arrangements: Spouse/significant other Available Help at Discharge: Family;Available 24 hours/day Type of Home: Independent living facility Home Access: Level entry     Home Layout: One level Home Equipment: Shower seat;Grab bars - toilet;Grab bars - tub/shower;Walker - 4 wheels;Cane - single point;Bedside commode Additional Comments: walk-in tub per spouse    Prior Function Level of Independence: Needs assistance   Gait / Transfers Assistance Needed: working with PT at home, ambulating with Rollator  ADL's / Homemaking Assistance Needed: Pt needs  assistances with ADLs.  Especially bathing.  Comments: utilizing transport chair for community mobility     Hand Dominance   Dominant Hand: Right    Extremity/Trunk Assessment   Upper Extremity  Assessment Upper Extremity Assessment: Generalized weakness    Lower Extremity Assessment Lower Extremity Assessment: Generalized weakness    Cervical / Trunk Assessment Cervical / Trunk Assessment: Kyphotic  Communication   Communication: HOH  Cognition Arousal/Alertness: Awake/alert Behavior During Therapy: Impulsive Overall Cognitive Status: Impaired/Different from baseline Area of Impairment: Orientation;Attention;Memory;Following commands;Safety/judgement;Awareness;Problem solving                 Orientation Level: Disoriented to;Situation;Place;Time(thinks its may 7th/8th oriented to year and month) Current Attention Level: Sustained Memory: Decreased recall of precautions;Decreased short-term memory Following Commands: Follows one step commands consistently Safety/Judgement: Decreased awareness of safety;Decreased awareness of deficits Awareness: Intellectual Problem Solving: Slow processing;Requires verbal cues General Comments: pt requires re-orientation to place 5-6 times during session, impulsive      General Comments General comments (skin integrity, edema, etc.): BP pre-mobility 141/90, post-mobility 163/94, other VSS during session. Limited evaluation due to carotid ultrasound and EEG limiting session time    Exercises     Assessment/Plan    PT Assessment Patient needs continued PT services  PT Problem List Decreased strength;Decreased activity tolerance;Decreased balance;Decreased mobility;Decreased cognition;Decreased knowledge of use of DME;Decreased safety awareness;Decreased knowledge of precautions       PT Treatment Interventions DME instruction;Gait training;Functional mobility training;Therapeutic activities;Therapeutic exercise;Balance training;Neuromuscular re-education;Cognitive remediation;Patient/family education    PT Goals (Current goals can be found in the Care Plan section)  Acute Rehab PT Goals Patient Stated Goal: To reduce falls  risk and be able to safely return home PT Goal Formulation: With family Time For Goal Achievement: 09/18/19 Potential to Achieve Goals: Fair Additional Goals Additional Goal #1: Pt will maintain dynamic standing balance within 10 inches of his base of support with unilateral UE support of the LRAD and minG    Frequency Min 4X/week(until SNF vs HHPT confirmed)   Barriers to discharge        Co-evaluation               AM-PAC PT "6 Clicks" Mobility  Outcome Measure Help needed turning from your back to your side while in a flat bed without using bedrails?: A Little Help needed moving from lying on your back to sitting on the side of a flat bed without using bedrails?: A Little Help needed moving to and from a bed to a chair (including a wheelchair)?: A Lot Help needed standing up from a chair using your arms (e.g., wheelchair or bedside chair)?: A Lot Help needed to walk in hospital room?: A Lot Help needed climbing 3-5 steps with a railing? : A Lot 6 Click Score: 14    End of Session   Activity Tolerance: Patient tolerated treatment well Patient left: in bed;with call bell/phone within reach;with bed alarm set;with family/visitor present Nurse Communication: Mobility status PT Visit Diagnosis: Unsteadiness on feet (R26.81);History of falling (Z91.81);Muscle weakness (generalized) (M62.81)    Time: [id number] PT Time Calculation (min) (ACUTE ONLY): 38 min   Charges:   PT Evaluation $PT Eval Moderate Complexity: 1 Mod          Zenaida Niece, PT, DPT Acute Rehabilitation Pager: [phone]   Zenaida Niece 09/04/2019, 12:32 PM

## 2019-09-04 NOTE — Progress Notes (Signed)
EEG Completed; Results Pending  

## 2019-09-05 DIAGNOSIS — F05 Delirium due to known physiological condition: Secondary | ICD-10-CM

## 2019-09-05 LAB — BASIC METABOLIC PANEL
Anion gap: 10 (ref 5–15)
BUN: 17 mg/dL (ref 8–23)
CO2: 21 mmol/L — ABNORMAL LOW (ref 22–32)
Calcium: 9.3 mg/dL (ref 8.9–10.3)
Chloride: 108 mmol/L (ref 98–111)
Creatinine, Ser: 1.22 mg/dL (ref 0.61–1.24)
GFR calc Af Amer: 58 mL/min — ABNORMAL LOW (ref >60)
GFR calc non Af Amer: 50 mL/min — ABNORMAL LOW (ref >60)
Glucose, Bld: 99 mg/dL (ref 70–99)
Potassium: 4.3 mmol/L (ref 3.5–5.1)
Sodium: 139 mmol/L (ref 135–145)

## 2019-09-05 LAB — CBC
HCT: 35.8 % — ABNORMAL LOW (ref 39.0–52.0)
Hemoglobin: 11.8 g/dL — ABNORMAL LOW (ref 13.0–17.0)
MCH: 31.5 pg (ref 26.0–34.0)
MCHC: 33 g/dL (ref 30.0–36.0)
MCV: 95.5 fL (ref 80.0–100.0)
Platelets: 169 10*3/uL (ref 150–400)
RBC: 3.75 MIL/uL — ABNORMAL LOW (ref 4.22–5.81)
RDW: 14 % (ref 11.5–15.5)
WBC: 11.8 10*3/uL — ABNORMAL HIGH (ref 4.0–10.5)
nRBC: 0 % (ref 0.0–0.2)

## 2019-09-05 LAB — HEMOGLOBIN A1C
Hgb A1c MFr Bld: 5.2 % (ref 4.8–5.6)
Mean Plasma Glucose: 103 mg/dL

## 2019-09-05 MED ORDER — ENOXAPARIN SODIUM 40 MG/0.4ML ~~LOC~~ SOLN
40.0000 mg | SUBCUTANEOUS | Status: DC
  Administered 2019-09-05 – 2019-09-06 (×2): 40 mg via SUBCUTANEOUS

## 2019-09-05 NOTE — Progress Notes (Signed)
PT Cancellation Note  Patient Details Name: Robert [name] MRN: [id number] DOB: January 28, 1921   Cancelled Treatment:    Reason Eval/Treat Not Completed: Patient's level of consciousness. Patient has been agitated since last night, in restraints, yelling at staff. Will hold for now and possibly re-attempt this afternoon if time allows.     Suhani Stillion 09/05/2019, 11:39 AM

## 2019-09-05 NOTE — TOC CAGE-AID Note (Signed)
Transition of Care [facility]) - CAGE-AID Screening   Patient Details  Name: Robert [name] MRN: [id number] Date of Birth: [birth year]  Transition of Care [facility]) CM/SW Contact:    Emeterio Reeve, [place] Phone Number: 09/05/2019, 12:44 PM   Clinical Narrative:  Pt unable to participate due to not being fully oriented.   CAGE-AID Screening: Substance Abuse Screening unable to be completed due to: : Patient unable to participate            [place] Clinical Social Worker [phone]

## 2019-09-05 NOTE — Progress Notes (Signed)
STROKE TEAM PROGRESS NOTE   INTERVAL HISTORY Wife at bedside. Pt is sleepy and drowsy, had rough night last night. Agitated and confusion, aggressive towards staff. Received total 100mg  seroquel and now difficulty to wake up. He was able to open eyes with calling his name but then closed eyes, not able to follow simple commands and answer orientation questions. As per RN, he was yelling once he was waken up. Not able to eat breakfast or lunch. On IVF   Vitals:   09/04/19 2000 09/04/19 2100 09/04/19 2300 09/05/19 0836  BP: (!) 159/58 138/90 (!) 158/80 (!) 134/94  Pulse: 89 96 88 97  Resp: 17 19 20 20   Temp:   97.7 F (36.5 C) (!) 97.5 F (36.4 C)  TempSrc:   Axillary Axillary  SpO2: 91% 94% 96% 97%  Weight:      Height:        CBC:  Recent Labs  Lab 09/03/19 1410 09/03/19 1410 09/03/19 1414 09/05/19 0144  WBC 8.0  --   --  11.8*  NEUTROABS 5.6  --   --   --   HGB 12.3*   < > 11.6* 11.8*  HCT 38.5*   < > 34.0* 35.8*  MCV 101.6*  --   --  95.5  PLT 167  --   --  169   < > = values in this interval not displayed.    Basic Metabolic Panel:  Recent Labs  Lab 09/03/19 1410 09/03/19 1410 09/03/19 1414 09/05/19 0144  NA 143   < > 140 139  K 5.0   < > 4.9 4.3  CL 109   < > 109 108  CO2 22  --   --  21*  GLUCOSE 91   < > 88 99  BUN 25*   < > 30* 17  CREATININE 1.45*   < > 1.40* 1.22  CALCIUM 9.2  --   --  9.3   < > = values in this interval not displayed.   Lipid Panel:     Component Value Date/Time   CHOL 134 09/04/2019 0353   TRIG 60 09/04/2019 0353   HDL 57 09/04/2019 0353   CHOLHDL 2.4 09/04/2019 0353   VLDL 12 09/04/2019 0353   LDLCALC 65 09/04/2019 0353   HgbA1c:  Lab Results  Component Value Date   HGBA1C 5.2 09/04/2019   Urine Drug Screen:     Component Value Date/Time   LABOPIA NONE DETECTED 09/03/2019 1636   COCAINSCRNUR NONE DETECTED 09/03/2019 1636   LABBENZ NONE DETECTED 09/03/2019 1636   AMPHETMU NONE DETECTED 09/03/2019 1636   THCU NONE  DETECTED 09/03/2019 1636   LABBARB NONE DETECTED 09/03/2019 1636    Alcohol Level     Component Value Date/Time   ETH <10 09/03/2019 1410    IMAGING past 24 hours CT HEAD WO CONTRAST  Result Date: 09/04/2019 CLINICAL DATA:  Stroke.  24 hours post tPA. EXAM: CT HEAD WITHOUT CONTRAST TECHNIQUE: Contiguous axial images were obtained from the base of the skull through the vertex without intravenous contrast. COMPARISON:  CT head 09/03/2019 FINDINGS: Brain: Advanced atrophy. Negative for hydrocephalus. Chronic infarct high right parietal lobe is unchanged. Chronic microvascular ischemic changes in the white matter. Negative for acute infarct, hemorrhage, mass.  No midline shift. Vascular: Negative for hyperdense vessel. Atherosclerotic calcification in the carotid and vertebral arteries. Skull: Negative Sinuses/Orbits: Paranasal sinuses clear. Bilateral cataract extraction. Other: None IMPRESSION: Negative for acute hemorrhage Atrophy and chronic ischemic changes stable from  yesterday. Electronically Signed   By: Franchot Gallo M.D.   On: 09/04/2019 13:19   MR BRAIN WO CONTRAST  Result Date: 09/04/2019 CLINICAL DATA:  Stroke presentation yesterday EXAM: MRI HEAD WITHOUT CONTRAST TECHNIQUE: Only diffusion imaging was performed. COMPARISON:  CT studies 09/03/2019 FINDINGS: Brain: Diffusion imaging does not show any acute or subacute infarction. No abnormality seen affecting the brainstem or cerebellum. Cerebral hemispheres show generalized atrophy and an old right parietal cortical and subcortical infarction. There is chronic small-vessel change of the white matter. No sign of hemorrhage, hydrocephalus or extra-axial collection. Vascular: No vascular data Skull and upper cervical spine: No abnormality seen Sinuses/Orbits: No sign of sinusitis or orbital pathology. Other: None IMPRESSION: Diffusion only study does not show any acute or subacute infarction. Chronic white matter small vessel ischemic changes.  Old right parietal infarction. Electronically Signed   By: Nelson Chimes M.D.   On: 09/04/2019 13:06   EEG adult  Result Date: 09/04/2019 Lora Havens, MD     09/04/2019  5:42 PM Patient Name: Robert [name] MRN: [id number] Epilepsy Attending: Lora Havens Referring Physician/Provider: Dr Rosalin Hawking Date:  09/04/2019 Duration: 23.27 mins Patient history: 86 y.o. male with history of stroke, TIA, HTN, HLD, Aortic atherosclerosis, ICA stenosis presenting with slurred speech, confusion, and aphasia. EEG to evaluate for seizure. Level of alertness: Awake AEDs during EEG study: None Technical aspects: This EEG study was done with scalp electrodes positioned according to the 10-20 International system of electrode placement. Electrical activity was acquired at a sampling rate of 500Hz  and reviewed with a high frequency filter of 70Hz  and a low frequency filter of 1Hz . EEG data were recorded continuously and digitally stored. Description: The posterior dominant rhythm consists of 7.5 Hz activity of moderate voltage (25-35 uV) seen predominantly in posterior head regions, symmetric and reactive to eye opening and eye closing.  Hyperventilation and photic stimulation were not performed.   IMPRESSION: This study is within normal limits.  No seizures or epileptiform discharges were seen throughout the recording. Lora Havens   ECHOCARDIOGRAM COMPLETE  Result Date: 09/04/2019    ECHOCARDIOGRAM REPORT   Patient Name:   Robert [name] [facility] Date of Exam: 09/04/2019 Medical Rec #:  [id number]     Height:       71.0 in Accession #:    [id number]    Weight:       187.8 lb Date of Birth:  [birth year]    BSA:          2.053 m Patient Age:    86 years      BP:           160/78 mmHg Patient Gender: M             HR:           59 bpm. Exam Location:  Inpatient Procedure: 2D Echo and Intracardiac Opacification Agent Indications:    Stroke 434.91 / I163.9  History:        Patient has prior history of Echocardiogram examinations,  most                 recent 06/26/2017. Risk Factors:Hypertension. Sepsis                 Aortic atherosclerosis.  Sonographer:    Vikki Ports Turrentine Referring Phys: [id number] ASHISH ARORA  Sonographer Comments: Suboptimal subcostal window and Technically difficult study due to poor echo windows. Image acquisition challenging due to uncooperative patient. IMPRESSIONS  1. Abnormal septal motion. Left ventricular ejection fraction, by estimation, is 50 to 55%. The left ventricle has low normal function. The left ventricle has no regional wall motion abnormalities. The left ventricular internal cavity size was mildly dilated. Left ventricular diastolic parameters are indeterminate.  2. Right ventricular systolic function is normal. The right ventricular size is normal.  3. The mitral valve is normal in structure. No evidence of mitral valve regurgitation. No evidence of mitral stenosis.  4. The aortic valve is tricuspid. Aortic valve regurgitation is not visualized. moderate sclerosis especially the non coronary cusp.  5. The inferior vena cava is normal in size with greater than 50% respiratory variability, suggesting right atrial pressure of 3 mmHg. FINDINGS  Left Ventricle: Abnormal septal motion. Left ventricular ejection fraction, by estimation, is 50 to 55%. The left ventricle has low normal function. The left ventricle has no regional wall motion abnormalities. Definity contrast agent was given IV to delineate the left ventricular endocardial borders. The left ventricular internal cavity size was mildly dilated. There is no left ventricular hypertrophy. Left ventricular diastolic parameters are indeterminate. Right Ventricle: The right ventricular size is normal. No increase in right ventricular wall thickness. Right ventricular systolic function is normal. Left Atrium: Left atrial size was normal in size. Right Atrium: Right atrial size was normal in size. Pericardium: There is no evidence of pericardial effusion.  Mitral Valve: The mitral valve is normal in structure. Normal mobility of the mitral valve leaflets. No evidence of mitral valve regurgitation. No evidence of mitral valve stenosis. Tricuspid Valve: The tricuspid valve is normal in structure. Tricuspid valve regurgitation is not demonstrated. No evidence of tricuspid stenosis. Aortic Valve: The aortic valve is tricuspid. Aortic valve regurgitation is not visualized. Moderate sclerosis especially the non coronary cusp. Pulmonic Valve: The pulmonic valve was normal in structure. Pulmonic valve regurgitation is not visualized. No evidence of pulmonic stenosis. Aorta: The aortic root is normal in size and structure. Venous: The inferior vena cava is normal in size with greater than 50% respiratory variability, suggesting right atrial pressure of 3 mmHg. IAS/Shunts: No atrial level shunt detected by color flow Doppler.  LEFT VENTRICLE PLAX 2D LVIDd:         4.40 cm  Diastology LVIDs:         3.30 cm  LV e' medial:   4.64 cm/s LV PW:         1.10 cm  LV E/e' medial: 11.0 LV IVS:        1.10 cm LVOT diam:     1.80 cm LV SV:         51 LV SV Index:   25 LVOT Area:     2.54 cm  LEFT ATRIUM           Index LA Vol (A2C): 65.8 ml 32.05 ml/m  AORTIC VALVE LVOT Vmax:   84.20 cm/s LVOT Vmean:  63.200 cm/s LVOT VTI:    0.200 m  AORTA Ao Root diam: 3.30 cm MITRAL VALVE MV Area (PHT): 3.60 cm    SHUNTS MV Decel Time: 211 msec    Systemic VTI:  0.20 m MV E velocity: 51.00 cm/s  Systemic Diam: 1.80 cm MV A velocity: 94.30 cm/s MV E/A ratio:  0.54 Jenkins Rouge MD Electronically signed by Jenkins Rouge MD Signature Date/Time: 09/04/2019/3:43:58 PM    Final     PHYSICAL EXAM  Temp:  [97.2 F (36.2 C)-97.8 F (36.6 C)] 97.5 F (36.4 C) (05/20 0836) Pulse Rate:  [  49-97] 97 (05/20 0836) Resp:  [13-23] 20 (05/20 0836) BP: (100-180)/(58-149) 134/94 (05/20 0836) SpO2:  [78 %-100 %] 97 % (05/20 0836)  General - Well nourished, well developed, drowsy sleepy.  Ophthalmologic -  fundi not visualized due to noncooperation.  Cardiovascular - Regular rhythm and rate.  Neuro - drowsy and sleepy, difficult arouse, he was able to have brief eye opening on voice but not able to answer orientation questions or following commands. With forced eye opening, eyes mid position, PERRL, not blinking to visual threat bilaterally. Facial symmetrical, tongue protrusion not cooperative. Moving all extremities equally on pain but became agitated and kicking and hitting. Sensation, coordination and gait not tested.   ASSESSMENT/PLAN Robert [name] is a 86 y.o. male with history of stroke, TIA, HTN, HLD, Aortic atherosclerosis, ICA stenosis presenting with slurred speech, confusion, and aphasia. Received IV tPA 09/03/2019 at 1434.   Stroke-like episode s/p tPA - MRI neg - similar episode 2012, 2013, 2016 and 2019, MRI all negative, diagnosed with TIAs  Code Stroke CT head No acute abnormality. Small vessel disease. Atrophy. Old R parietal infarct. ASPECTS 10.     CTA head & neck R ICA 35% stenosis. Mild atherosclerosis L ICA bifurcation.   CUS unremarkable  MRI  No acute infarct. Small vessel disease. Old R parietal infarct.  CT head repeat no ICH. Small vessel disease. Atrophy.   2D Echo EF 50-55%  EEG no sz  LDL 65  HgbA1c 5.2  SCDs for VTE prophylaxis  aspirin 81 mg daily prior to admission, now on ASA 81mg     Therapy recommendations:  SNF  Disposition:  pending (lives at [facility]) - previously in SNF there and does not want to go back. However, mental status may preclude him making his own decision. Discussed w/ CM who will discuss w/ wife.   Cognitive decline Delirium Sundowning  For the last one year, wife and daughter noted intermittent slurry speech  Overnight patient had delirium and hallucination  Today patient also had sundowning and abdomen  Posey belt restraint  Received addition seroquel 75 last night   On Seroquel 25 hs -> hold tonight as pt  still sleepy in pm  Hx stroke and TIA  06/2017 - TIA. aphasia and disorientation. Resolved. OP workup w/ B ICA 50% stenosis. AV stenosis. HTN. Continued asa (no plavix d/t falls and concern for bleeding).  12/2014 - Hx stroke w/ residual vision deficits OD. On aspirin. Previously on plavix.   2012, 2013, 2016 - recurrent TIAs with similar presentation as 2019   Hypertension  BP as high as 195/90  Home meds:  None listed  Treated w/ cleviprex, now off  BP goal < 180/105  On amlodipine 10 . Long-term BP goal normotensive  Hyperlipidemia  Home meds:  lipitor 10 and fish oil  LDL 65, at goal < 70  Resume Lipitor 10  Continue statin and fish oil at discharge  Other Stroke Risk Factors  Advanced age  Former Cigarette smoker, quit 11 yrs ago  ETOH use, alcohol level <10, advised to drink no more than 2 drink(s) a day  Aortic atherosclerosis.  Other Active Problems  CKD stage III, Cre 1.45-1.40->1.22 - on IVF   Leukocytosis WBC 8.0->11.8  Prostate cancer  Hospital day # 2  I had long discussion with wife at bedside, updated pt current condition, treatment plan and potential prognosis, and answered all the questions. Wife expressed understanding and appreciation.    Rosalin Hawking, MD PhD Stroke  Neurology 09/05/2019 11:24 AM   To contact Stroke Continuity provider, please refer to [email or web address] After hours, contact General Neurology

## 2019-09-05 NOTE — Progress Notes (Signed)
Upon assessment of skin under restraint, at [place], noted to have some redness to his right wrist, where he was pulling away and trying to get out of the restraint and also he had a quarter sized red area to right anterior lower leg, just above the foot where he was attempting to get out and was pulling and kicking legs.  He was yelling that he was getting out of the bed and out of this place and this nurse did release the restraints and stayed at his side to give him time without the restraints but he is not safe and kept trying to get up out of the bed and was trying to pull out IV.  I did pad his wrist and ankle.  He did eat some food but declined the need to use the bedpan and denied pain.  Just said he is getting out of here.

## 2019-09-06 LAB — CBC
HCT: 38.5 % — ABNORMAL LOW (ref 39.0–52.0)
Hemoglobin: 12.7 g/dL — ABNORMAL LOW (ref 13.0–17.0)
MCH: 31.8 pg (ref 26.0–34.0)
MCHC: 33 g/dL (ref 30.0–36.0)
MCV: 96.3 fL (ref 80.0–100.0)
Platelets: 167 10*3/uL (ref 150–400)
RBC: 4 MIL/uL — ABNORMAL LOW (ref 4.22–5.81)
RDW: 14.2 % (ref 11.5–15.5)
WBC: 11.7 10*3/uL — ABNORMAL HIGH (ref 4.0–10.5)
nRBC: 0 % (ref 0.0–0.2)

## 2019-09-06 LAB — BASIC METABOLIC PANEL
Anion gap: 9 (ref 5–15)
BUN: 19 mg/dL (ref 8–23)
CO2: 22 mmol/L (ref 22–32)
Calcium: 9.4 mg/dL (ref 8.9–10.3)
Chloride: 110 mmol/L (ref 98–111)
Creatinine, Ser: 1.39 mg/dL — ABNORMAL HIGH (ref 0.61–1.24)
GFR calc Af Amer: 49 mL/min — ABNORMAL LOW (ref >60)
GFR calc non Af Amer: 42 mL/min — ABNORMAL LOW (ref >60)
Glucose, Bld: 84 mg/dL (ref 70–99)
Potassium: 4.2 mmol/L (ref 3.5–5.1)
Sodium: 141 mmol/L (ref 135–145)

## 2019-09-06 MED ORDER — QUETIAPINE FUMARATE 25 MG PO TABS
25.0000 mg | ORAL_TABLET | Freq: Every evening | ORAL | Status: DC | PRN
  Administered 2019-09-06: 25 mg via ORAL
  Filled 2019-09-06: qty 1

## 2019-09-06 NOTE — Progress Notes (Signed)
Physical Therapy Treatment Patient Details Name: Robert [name] MRN: [id number] DOB: 1921/04/02 Today's Date: 09/06/2019    History of Present Illness 86 y.o. male past medical history of aortic atherosclerosis, bradycardia, CKD stage III, history of a prior stroke with residual right eye deficits, relatively recent admission for UTI, nephrolithiasis, history of prostate cancer, multiple TIAs in the past according to the wife, presenting to the ER with slurred speech and confusion. Pt received TPA on 5/18.     PT Comments    Patient received in bed, wife present. More alert, not agitated at this time. HOH. Patient agrees to PT session. He requires mod assist to perform supine to sit, with poor initial sitting balance, leaning to his left. With attempts at standing patient requiring max assist from elevated surface and was unable to stand. He was able to scoot in sitting toward the head of the bed. Returned to supine with min assist and mod assist for positioning in bed.  He will continue to benefit from skilled PT while here to improve functional mobility, safety and independence.     Follow Up Recommendations  SNF;Supervision/Assistance - 24 hour     Equipment Recommendations  None recommended by PT    Recommendations for Other Services       Precautions / Restrictions Precautions Precautions: Fall Precaution Comments: impulsive Restrictions Weight Bearing Restrictions: No    Mobility  Bed Mobility Overal bed mobility: Needs Assistance Bed Mobility: Supine to Sit;Sit to Supine     Supine to sit: Mod assist Sit to supine: Min assist   General bed mobility comments: assist needed to raise trunk to seated position, poor intitial sitting balance. able to assist with scooting while seated at edge of bed by using bed rail.  Transfers Overall transfer level: Needs assistance Equipment used: Rolling walker (2 wheeled)             General transfer comment: attempted to stand  from raised bed height. Despite mod assist he was unable to get standing.  Ambulation/Gait             General Gait Details: unable   Stairs             Wheelchair Mobility    Modified Rankin (Stroke Patients Only)       Balance Overall balance assessment: Needs assistance Sitting-balance support: Bilateral upper extremity supported;Feet supported Sitting balance-Leahy Scale: Poor Sitting balance - Comments: minG at edge of bed                                    Cognition Arousal/Alertness: Awake/alert Behavior During Therapy: Restless;Impulsive Overall Cognitive Status: Impaired/Different from baseline Area of Impairment: Following commands;Safety/judgement;Awareness;Problem solving                 Orientation Level: Situation;Disoriented to Current Attention Level: Sustained Memory: Decreased recall of precautions;Decreased short-term memory Following Commands: Follows one step commands inconsistently Safety/Judgement: Decreased awareness of safety;Decreased awareness of deficits Awareness: Intellectual Problem Solving: Slow processing;Difficulty sequencing;Requires verbal cues;Requires tactile cues General Comments: impulsive, unsafe,      Exercises      General Comments        Pertinent Vitals/Pain Pain Assessment: Faces Faces Pain Scale: Hurts little more Pain Location: reports pain in right ankle and right wrist from restraints Pain Descriptors / Indicators: Sore Pain Intervention(s): Monitored during session    Home Living  Prior Function            PT Goals (current goals can now be found in the care plan section) Acute Rehab PT Goals Patient Stated Goal: to get up and walk with walker PT Goal Formulation: With patient/family Time For Goal Achievement: 09/18/19 Potential to Achieve Goals: Fair Additional Goals Additional Goal #1: Pt will maintain dynamic standing balance within 10  inches of his base of support with unilateral UE support of the LRAD and minG Progress towards PT goals: Progressing toward goals    Frequency    Min 2X/week      PT Plan Frequency needs to be updated    Co-evaluation              AM-PAC PT "6 Clicks" Mobility   Outcome Measure  Help needed turning from your back to your side while in a flat bed without using bedrails?: A Lot Help needed moving from lying on your back to sitting on the side of a flat bed without using bedrails?: A Lot Help needed moving to and from a bed to a chair (including a wheelchair)?: Total Help needed standing up from a chair using your arms (e.g., wheelchair or bedside chair)?: Total Help needed to walk in hospital room?: Total Help needed climbing 3-5 steps with a railing? : Total 6 Click Score: 8    End of Session Equipment Utilized During Treatment: Gait belt Activity Tolerance: Patient limited by fatigue Patient left: in bed;with bed alarm set;with family/visitor present;with call bell/phone within reach Nurse Communication: Mobility status PT Visit Diagnosis: Unsteadiness on feet (R26.81);History of falling (Z91.81);Muscle weakness (generalized) (M62.81);Difficulty in walking, not elsewhere classified (R26.2);Other abnormalities of gait and mobility (R26.89)     Time: [id number] PT Time Calculation (min) (ACUTE ONLY): 23 min  Charges:  $Therapeutic Activity: 23-37 mins                     Nadiah Corbit, PT, GCS 09/06/19,2:35 PM

## 2019-09-06 NOTE — Progress Notes (Signed)
  Speech Language Pathology Treatment: Cognitive-Linquistic  Patient Details Name: Robert [name] MRN: [id number] DOB: Feb 10, 1921 Today's Date: 09/06/2019 Time: [id number] SLP Time Calculation (min) (ACUTE ONLY): 23 min  Assessment / Plan / Recommendation Clinical Impression  Pt was seen for cognitive-linguistic treatment. He was alert and cooperative throughout the session. He was oriented to person, place, and partially time. He was re-oriented to time. He demonstrated 67% accuracy with problem solving related to safety increasing to 100% with cues for reasoning. He achieved 75% accuracy with time management problems when visual cues of clocks were given. He reported that he typically purchases items with cash. He achieved 80% accuracy with calculation of coin or note totals. However, he consistently required cues to determine the totals when coins were combined with notes. Pt was drinking a Nepro throughout the session. He demonstrated coughing at baseline and following intake of thin liquids via straw. SLP has not been consulted for swallowing and per chart he has passed the [place]. If these symptoms persist, a swallow evaluation would be clinically indicated.    HPI HPI: 86 y.o. male past medical history of aortic atherosclerosis, bradycardia, CKD stage III, history of a prior stroke with residual right eye deficits, relatively recent admission for UTI, nephrolithiasis, history of prostate cancer, multiple TIAs in the past according to the wife, presenting to the ER with slurred speech and confusion. Pt received TPA on 5/18.       SLP Plan  Continue with current plan of care       Recommendations                   Follow up Recommendations: Inpatient Rehab SLP Visit Diagnosis: Cognitive communication deficit [id number]) Plan: Continue with current plan of care       Adelayde Minney I. Hardin Negus, [place], [place] Office number [phone] Pager  [place] 09/06/2019, 5:34 PM

## 2019-09-06 NOTE — Progress Notes (Signed)
STROKE TEAM PROGRESS NOTE   INTERVAL HISTORY Wife at bedside.  Patient today more awake alert, answer question appropriately, calm, not agitated, restrain to cough at 2 PM.  Wife at the bedside stated that patient is at his baseline now.  Vitals:   09/05/19 2348 09/06/19 0429 09/06/19 0814 09/06/19 1201  BP: (!) 142/69 (!) 162/75 (!) 141/79 (!) 105/55  Pulse: 64 74 87 75  Resp: 19 19 20 18   Temp: 98.1 F (36.7 C) 97.9 F (36.6 C) 98 F (36.7 C) 97.8 F (36.6 C)  TempSrc: Axillary Oral Oral Oral  SpO2: 98% 92% 96% 95%  Weight:      Height:        CBC:  Recent Labs  Lab 09/03/19 1410 09/03/19 1414 09/05/19 0144 09/06/19 0448  WBC 8.0   < > 11.8* 11.7*  NEUTROABS 5.6  --   --   --   HGB 12.3*   < > 11.8* 12.7*  HCT 38.5*   < > 35.8* 38.5*  MCV 101.6*   < > 95.5 96.3  PLT 167   < > 169 167   < > = values in this interval not displayed.    Basic Metabolic Panel:  Recent Labs  Lab 09/05/19 0144 09/06/19 0448  NA 139 141  K 4.3 4.2  CL 108 110  CO2 21* 22  GLUCOSE 99 84  BUN 17 19  CREATININE 1.22 1.39*  CALCIUM 9.3 9.4   Lipid Panel:     Component Value Date/Time   CHOL 134 09/04/2019 0353   TRIG 60 09/04/2019 0353   HDL 57 09/04/2019 0353   CHOLHDL 2.4 09/04/2019 0353   VLDL 12 09/04/2019 0353   LDLCALC 65 09/04/2019 0353   HgbA1c:  Lab Results  Component Value Date   HGBA1C 5.2 09/04/2019   Urine Drug Screen:     Component Value Date/Time   LABOPIA NONE DETECTED 09/03/2019 1636   COCAINSCRNUR NONE DETECTED 09/03/2019 1636   LABBENZ NONE DETECTED 09/03/2019 1636   AMPHETMU NONE DETECTED 09/03/2019 1636   THCU NONE DETECTED 09/03/2019 1636   LABBARB NONE DETECTED 09/03/2019 1636    Alcohol Level     Component Value Date/Time   ETH <10 09/03/2019 1410    IMAGING past 24 hours No results found.  PHYSICAL EXAM    Temp:  [97.8 F (36.6 C)-98.2 F (36.8 C)] 97.8 F (36.6 C) (05/21 1201) Pulse Rate:  [64-93] 75 (05/21 1201) Resp:   [18-20] 18 (05/21 1201) BP: (105-162)/(55-79) 105/55 (05/21 1201) SpO2:  [92 %-98 %] 95 % (05/21 1201)  General - Well nourished, well developed, in no apparent distress.  Ophthalmologic - fundi not visualized due to noncooperation.  Cardiovascular - Regular rhythm and rate.  Mental Status -  Level of arousal and orientation to time, place, and person were intact. Language including expression, naming, repetition, comprehension was assessed and found intact. Moderate dysarthria  Cranial Nerves II - XII - II - Visual field intact OU. Right eye able to see HW III, IV, VI - Extraocular movements intact. V - Facial sensation intact bilaterally. VII - Facial movement intact bilaterally. VIII - Hearing & vestibular intact bilaterally. X - Palate elevates symmetrically. XI - Chin turning & shoulder shrug intact bilaterally. XII - Tongue protrusion intact.  Motor Strength - The patient's strength was normal in all extremities and pronator drift was absent.  Bulk was normal and fasciculations were absent.   Motor Tone - Muscle tone was assessed at the  neck and appendages and was normal.  Reflexes - The patient's reflexes were symmetrical in all extremities and he had no pathological reflexes.  Sensory - Light touch, temperature/pinprick were assessed and were symmetrical.    Coordination - The patient had normal movements in the hands with no ataxia or dysmetria, however slower on the left than right.  Tremor with action, present bilateral but more on the left than right.  Gait and Station - deferred.   ASSESSMENT/PLAN [name] is a 86 y.o. male with history of stroke, TIA, HTN, HLD, Aortic atherosclerosis, ICA stenosis presenting with slurred speech, confusion, and aphasia. Received IV tPA 09/03/2019 at 1434.   Stroke-like episode s/p tPA - MRI neg - similar episodes 2012, 2013, 2016 and 2019, MRI all negative, diagnosed with TIAs  Code Stroke CT head No acute  abnormality. Small vessel disease. Atrophy. Old R parietal infarct. ASPECTS 10.     CTA head & neck R ICA 35% stenosis. Mild atherosclerosis L ICA bifurcation.   CUS unremarkable  MRI  No acute infarct. Small vessel disease. Old R parietal infarct.  CT head repeat no ICH. Small vessel disease. Atrophy.   2D Echo EF 50-55%  EEG no sz  LDL 65  HgbA1c 5.2  Lovenox 40 mg sq daily for VTE prophylaxis  aspirin 81 mg daily prior to admission, now on ASA 81mg     Therapy recommendations:  SNF  Disposition:  SNF at [facility]. Plan for d/c tomorrow after 2pm. COVID testing pending   Cognitive decline Delirium Sundowning  For the last one year, wife and daughter noted intermittent slurry speech  Patient had delirium and hallucination 5/18 overnight  5/29 patient also had sundowning and abdomen -> Posey belt restraint -> Received 25mg  and addition seroquel 75  5/20 drowsy sleepy -> seroquel on hold  5/21 back to baseline  On Seroquel 25 hs PRN now  Hx stroke and TIA  06/2017 - TIA. aphasia and disorientation. Resolved. OP workup w/ B ICA 50% stenosis. AV stenosis. HTN. Continued asa (no plavix d/t falls and concern for bleeding).  12/2014 - Hx stroke w/ residual vision deficits OD. On aspirin. Previously on plavix.   2012, 2013, 2016 - recurrent TIAs with similar presentation as 2019   Hypertension  BP as high as 195/90  Home meds:  None listed  Treated w/ cleviprex, now off  BP goal < 180/105  On amlodipine 10 . Long-term BP goal normotensive  Hyperlipidemia  Home meds:  lipitor 10 and fish oil  LDL 65, at goal < 70  Resume Lipitor 10  Continue statin and fish oil at discharge  Other Stroke Risk Factors  Advanced age  Former Cigarette smoker, quit 11 yrs ago  ETOH use, alcohol level <10, advised to drink no more than 2 drink(s) a day  Aortic atherosclerosis.  Other Active Problems  CKD stage III, Cre 1.45-1.40->1.22->1.39 - on IVF    Leukocytosis WBC 8.0->11.8->11.7  Prostate cancer  Hospital day # 3  I had long discussion with wife at bedside, updated pt current condition, treatment plan and potential prognosis, and answered all the questions. Wife expressed understanding and appreciation, and agree to be discharged tomorrow back to [facility].  Rosalin Hawking, MD PhD Stroke Neurology 09/06/2019 2:26 PM   To contact Stroke Continuity provider, please refer to [email or web address] After hours, contact General Neurology

## 2019-09-06 NOTE — TOC Initial Note (Signed)
Transition of Care [facility]) - Initial/Assessment Note    Patient Details  Name: Robert [name] MRN: [id number] Date of Birth: 1920/06/11  Transition [facility]) CM/SW Contact:    Benard Halsted, LCSW Phone Number: 09/06/2019, 3:40 PM  Clinical Narrative:                 CSW received SNF consult. CSW spoke with patient's spouse. She reported they have arranged for the patient to go to the rehab side at [facility] as they currently both reside at [place]. CSW spoke with [facility] and confirmed the plan. They are able to accept patient pending insurance approval. CSW faxed in clinicals for review. SNF requesting updated COVID test, MD aware. [facility] requesting that patient info be sent by 12pm Saturday, so it will depend on when insurance comes through.    Expected Discharge Plan: Skilled Nursing Facility Barriers to Discharge: Continued Medical Work up, Ship broker   Patient Goals and CMS Choice Patient states their goals for this hospitalization and ongoing recovery are:: Rehab CMS Medicare.gov Compare Post Acute Care list provided to:: Patient Represenative (must comment)(Spouse) Choice offered to / list presented to : [place] / [place]  Expected Discharge Plan and Services Expected Discharge Plan: [place] In-house Referral: Clinical Social Work   Post Acute Care Choice: [place] Living arrangements for the past 2 months: [place]                                      Prior Living Arrangements/Services Living arrangements for the past 2 months: [place] Lives with:: Spouse Patient language and need for interpreter reviewed:: Yes Do you feel safe going back to the place where you live?: Yes      Need for Family Participation in Patient Care: Yes (Comment) Care giver support system in place?: Yes (comment) Current home services: DME Criminal Activity/Legal Involvement  Pertinent to Current Situation/Hospitalization: No - Comment as needed  Activities of Daily Living Home Assistive Devices/Equipment: Built-in shower seat, Cane (specify quad or straight), Eyeglasses, Hearing aid, Hand-held shower hose, Grab bars in shower, Grab bars around toilet, Raised toilet seat with rails, Walker (specify type) ADL Screening (condition at time of admission) Patient's cognitive ability adequate to safely complete daily activities?: Yes Is the patient deaf or have difficulty hearing?: Yes Does the patient have difficulty seeing, even when wearing glasses/contacts?: No Does the patient have difficulty concentrating, remembering, or making decisions?: No Patient able to express need for assistance with ADLs?: Yes Does the patient have difficulty dressing or bathing?: Yes Independently performs ADLs?: No Communication: Independent Dressing (OT): Needs assistance Is this a change from baseline?: Pre-admission baseline Grooming: Needs assistance Is this a change from baseline?: Pre-admission baseline Feeding: Independent Bathing: Needs assistance Is this a change from baseline?: Pre-admission baseline Toileting: Needs assistance Is this a change from baseline?: Pre-admission baseline In/Out Bed: Needs assistance Is this a change from baseline?: Pre-admission baseline Walks in Home: Needs assistance Is this a change from baseline?: Pre-admission baseline Does the patient have difficulty walking or climbing stairs?: Yes Weakness of Legs: Both Weakness of Arms/Hands: None  Permission Sought/Granted Permission sought to share information with : Facility Sport and exercise psychologist, Family Supports Permission granted to share information with : Yes, Verbal Permission Granted  Share Information with NAME: Lujean Rave  Permission granted to share info w AGENCY: [place]  Permission granted to share info w Relationship: Spouse  Permission granted to share info w Contact  Information: [phone]  Emotional Assessment Appearance:: Appears stated age Attitude/Demeanor/Rapport: Unable to Assess Affect (typically observed): Unable to Assess Orientation: : Oriented to Self, Oriented to  Time Alcohol / Substance Use: Not Applicable Psych Involvement: No (comment)  Admission diagnosis:  Acute ischemic stroke [facility]) [I63.9] Patient Active Problem List   Diagnosis Date Noted  . Acute ischemic stroke ([place]) 09/03/2019  . Bacteremia due to Escherichia coli 06/27/2019  . Pressure injury of skin 06/25/2019  . Ureteral calculus   . Hydronephrosis with urinary obstruction due to ureteral calculus 06/21/2019  . Acute metabolic encephalopathy XX123456  . ARF (acute renal failure) ([place]) 03/27/2018  . Kidney stone 02/19/2018  . UTI (urinary tract infection) 02/19/2018  . Sepsis ([place]) 02/01/2017  . HCAP (healthcare-associated pneumonia) 02/01/2017  . Acute on chronic renal failure ([place]) 02/01/2017  . CKD (chronic kidney disease) stage 3, GFR 30-59 ml/min 11/08/2016  . HTN (hypertension) 10/03/2016  . History of CVA (cerebrovascular accident) 10/03/2016  . Pure hypercholesterolemia 10/03/2016  . Other male erectile dysfunction 06/27/2016  . Aortic atherosclerosis ([place]) 03/01/2016  . Vaccine counseling 12/30/2015   PCP:  Lauree Chandler, NP Pharmacy:   [place] [place] [facility] [address] [place] [address] Phone: [phone] Fax: [phone]     Social Determinants of Health (SDOH) Interventions    Readmission Risk Interventions Readmission Risk Prevention Plan 09/06/2019  Transportation Screening Complete  PCP or Specialist Appt within 5-7 Days Complete  Home Care Screening Complete  Medication Review (RN CM) Complete  Some recent data might be hidden

## 2019-09-06 NOTE — NC FL2 (Signed)
[place] LEVEL OF CARE SCREENING TOOL     IDENTIFICATION  Patient Name: Robert [name] Birthdate: 04-25-1920 Sex: male Admission Date (Current Location): 09/03/2019  [facility] and [place] Number:  Engineering geologist and Address:  [facility] [place], [place] [address], [place], [place] [address]      Provider Number: [id number]  Attending Physician Name and Address:  Rosalin Hawking, MD  Relative Name and Phone Number:  Lujean Rave, son, [phone]    Current Level of Care: Hospital Recommended Level of Care: [place] Prior Approval Number:    Date Approved/Denied:   PASRR Number: [id number]  Discharge Plan: SNF    Current Diagnoses: Patient Active Problem List   Diagnosis Date Noted  . Acute ischemic stroke ([place]) 09/03/2019  . Bacteremia due to Escherichia coli 06/27/2019  . Pressure injury of skin 06/25/2019  . Ureteral calculus   . Hydronephrosis with urinary obstruction due to ureteral calculus 06/21/2019  . Acute metabolic encephalopathy [id number]  . ARF (acute renal failure) ([place]) 03/27/2018  . Kidney stone 02/19/2018  . UTI (urinary tract infection) 02/19/2018  . Sepsis ([place]) 02/01/2017  . HCAP (healthcare-associated pneumonia) 02/01/2017  . Acute on chronic renal failure ([place]) 02/01/2017  . CKD (chronic kidney disease) stage 3, GFR 30-59 ml/min 11/08/2016  . HTN (hypertension) 10/03/2016  . History of CVA (cerebrovascular accident) 10/03/2016  . Pure hypercholesterolemia 10/03/2016  . Other male erectile dysfunction 06/27/2016  . Aortic atherosclerosis ([place]) 03/01/2016  . Vaccine counseling 12/30/2015    Orientation RESPIRATION BLADDER Height & Weight     Self, Time  Normal Incontinent, External catheter Weight: 187 lb 13.3 oz (85.2 kg) Height:  5\' 11"  (180.3 cm)(height from different admission)  BEHAVIORAL SYMPTOMS/MOOD NEUROLOGICAL BOWEL NUTRITION STATUS  (Confused)   Continent Diet(Please see DC Summary)   AMBULATORY STATUS COMMUNICATION OF NEEDS Skin   Extensive Assist Verbally Normal                       Personal Care Assistance Level of Assistance  Bathing, Feeding, Dressing Bathing Assistance: Maximum assistance Feeding assistance: Limited assistance Dressing Assistance: Maximum assistance     Functional Limitations Info  Sight, Hearing, Speech Sight Info: Adequate Hearing Info: Impaired Speech Info: Adequate    SPECIAL CARE FACTORS FREQUENCY  PT (By licensed PT), OT (By licensed OT)     PT Frequency: 5x/week OT Frequency: 5x/week            Contractures Contractures Info: Not present    Additional Factors Info  Code Status, Allergies Code Status Info: DNR Allergies Info: Morphine And Related, Sulfamethoxazole-trimethoprim, Tramadol           Current Medications (09/06/2019):  This is the current hospital active medication list Current Facility-Administered Medications  Medication Dose Route Frequency Provider Last Rate Last Admin  . 0.9 %  sodium chloride infusion   Intravenous Continuous Rosalin Hawking, MD 50 mL/hr at 09/06/19 0826 New Bag at 09/06/19 [id number]  . acetaminophen (TYLENOL) tablet 650 mg  650 mg Oral Q4H PRN Amie Portland, MD       Or  . acetaminophen (TYLENOL) 160 MG/5ML solution 650 mg  650 mg Per Tube Q4H PRN Amie Portland, MD       Or  . acetaminophen (TYLENOL) suppository 650 mg  650 mg Rectal Q4H PRN Amie Portland, MD      . amLODipine (NORVASC) tablet 10 mg  10 mg Oral Daily Rosalin Hawking, MD  10 mg at 09/06/19 1004  . aspirin EC tablet 81 mg  81 mg Oral Daily Rosalin Hawking, MD   81 mg at 09/06/19 1005  . atorvastatin (LIPITOR) tablet 10 mg  10 mg Oral Daily Rosalin Hawking, MD   10 mg at 09/06/19 1004  . Chlorhexidine Gluconate Cloth 2 % PADS 6 each  6 each Topical Daily Amie Portland, MD   6 each at 09/06/19 1016  . enoxaparin (LOVENOX) injection 40 mg  40 mg Subcutaneous Q24H Rosalin Hawking, MD   40 mg at 09/05/19 2054  . feeding supplement (NEPRO  CARB STEADY) liquid 237 mL  237 mL Oral BID BM Rosalin Hawking, MD   237 mL at 09/06/19 1016  . labetalol (NORMODYNE) injection 10-20 mg  10-20 mg Intravenous Q2H PRN Rosalin Hawking, MD      . multivitamin with minerals tablet 1 tablet  1 tablet Oral Daily Rosalin Hawking, MD   1 tablet at 09/06/19 1004  . pantoprazole (PROTONIX) EC tablet 40 mg  40 mg Oral Daily Rosalin Hawking, MD   40 mg at 09/06/19 1004  . QUEtiapine (SEROQUEL) tablet 25 mg  25 mg Oral QHS PRN Rosalin Hawking, MD      . senna-docusate (Senokot-S) tablet 1 tablet  1 tablet Oral QHS PRN Amie Portland, MD      . tamsulosin [facility]) capsule 0.4 mg  0.4 mg Oral QHS Rosalin Hawking, MD   0.4 mg at 09/05/19 2139     Discharge Medications: Please see discharge summary for a list of discharge medications.  Relevant Imaging Results:  Relevant Lab Results:   Additional Information SSN [id number]  Benard Halsted, LCSW

## 2019-09-07 ENCOUNTER — Inpatient Hospital Stay (HOSPITAL_COMMUNITY): Payer: Medicare PPO

## 2019-09-07 DIAGNOSIS — T17908A Unspecified foreign body in respiratory tract, part unspecified causing other injury, initial encounter: Secondary | ICD-10-CM

## 2019-09-07 LAB — BASIC METABOLIC PANEL
Anion gap: 10 (ref 5–15)
BUN: 31 mg/dL — ABNORMAL HIGH (ref 8–23)
CO2: 20 mmol/L — ABNORMAL LOW (ref 22–32)
Calcium: 9 mg/dL (ref 8.9–10.3)
Chloride: 109 mmol/L (ref 98–111)
Creatinine, Ser: 1.75 mg/dL — ABNORMAL HIGH (ref 0.61–1.24)
GFR calc Af Amer: 37 mL/min — ABNORMAL LOW (ref >60)
GFR calc non Af Amer: 32 mL/min — ABNORMAL LOW (ref >60)
Glucose, Bld: 133 mg/dL — ABNORMAL HIGH (ref 70–99)
Potassium: 3.7 mmol/L (ref 3.5–5.1)
Sodium: 139 mmol/L (ref 135–145)

## 2019-09-07 LAB — CBC
HCT: 35.9 % — ABNORMAL LOW (ref 39.0–52.0)
Hemoglobin: 11.7 g/dL — ABNORMAL LOW (ref 13.0–17.0)
MCH: 31.5 pg (ref 26.0–34.0)
MCHC: 32.6 g/dL (ref 30.0–36.0)
MCV: 96.5 fL (ref 80.0–100.0)
Platelets: 160 10*3/uL (ref 150–400)
RBC: 3.72 MIL/uL — ABNORMAL LOW (ref 4.22–5.81)
RDW: 14.5 % (ref 11.5–15.5)
WBC: 10.9 10*3/uL — ABNORMAL HIGH (ref 4.0–10.5)
nRBC: 0 % (ref 0.0–0.2)

## 2019-09-07 LAB — SARS CORONAVIRUS 2 (TAT 6-24 HRS): SARS Coronavirus 2: NEGATIVE

## 2019-09-07 LAB — GLUCOSE, CAPILLARY
Glucose-Capillary: 106 mg/dL — ABNORMAL HIGH (ref 70–99)
Glucose-Capillary: 162 mg/dL — ABNORMAL HIGH (ref 70–99)
Glucose-Capillary: 124 mg/dL — ABNORMAL HIGH (ref 70–99)
Glucose-Capillary: 139 mg/dL — ABNORMAL HIGH (ref 70–99)

## 2019-09-07 MED ORDER — AMLODIPINE BESYLATE 10 MG PO TABS: 10.0000 mg | ORAL_TABLET | Freq: Every day | ORAL | 1 refills | Status: AC

## 2019-09-07 MED ORDER — ASPIRIN 81 MG PO CHEW
81.0000 mg | CHEWABLE_TABLET | Freq: Every day | ORAL | Status: DC
  Administered 2019-09-08 – 2019-09-11 (×4): 81 mg via ORAL
  Filled 2019-09-07 (×4): qty 1

## 2019-09-07 MED ORDER — HEPARIN SODIUM (PORCINE) 5000 UNIT/ML IJ SOLN
5000.0000 [IU] | Freq: Three times a day (TID) | INTRAMUSCULAR | Status: DC
  Administered 2019-09-07 – 2019-09-11 (×11): 5000 [IU] via SUBCUTANEOUS
  Filled 2019-09-07 (×12): qty 1

## 2019-09-07 MED ORDER — DEXTROSE-NACL 5-0.9 % IV SOLN: INTRAVENOUS | Status: DC

## 2019-09-07 MED ORDER — QUETIAPINE FUMARATE 25 MG PO TABS: 25.0000 mg | ORAL_TABLET | Freq: Every day | ORAL | Status: DC

## 2019-09-07 MED ORDER — QUETIAPINE FUMARATE 25 MG PO TABS
25.0000 mg | ORAL_TABLET | Freq: Every day | ORAL | Status: DC
  Filled 2019-09-07: qty 1

## 2019-09-07 MED ORDER — QUETIAPINE FUMARATE 25 MG PO TABS: 25.0000 mg | ORAL_TABLET | Freq: Every evening | ORAL | 1 refills | Status: AC | PRN

## 2019-09-07 MED ORDER — HALOPERIDOL LACTATE 5 MG/ML IJ SOLN
2.0000 mg | Freq: Four times a day (QID) | INTRAMUSCULAR | Status: DC | PRN
  Administered 2019-09-07: 2 mg via INTRAVENOUS
  Filled 2019-09-07: qty 1

## 2019-09-07 NOTE — Progress Notes (Signed)
Pt had difficultly this am maintaining oxygen saturations.  He was originally in the low 80's on RA, then placed on 5-7L [place], then non-rebreather.  Dr, Erlinda Hong, RRT, Charge nurse, and RR Nurse Puja consulted.  Patient currently on Venturi mask mask and maintaining saturation 92-96, will continue to monitor.  Per Dr. Erlinda Hong, medications can be held including aspirin.  Asked Dr. Erlinda Hong if rectal ASA needed and per Dr. Erlinda Hong, MD orders for rectal aspirin are not needed.

## 2019-09-07 NOTE — TOC Progression Note (Addendum)
Transition of Care [facility]) - Progression Note    Patient Details  Name: Robert [name] MRN: [id number] Date of Birth: 1921-01-03  Transition [facility] CM/SW [place], [place] Phone Number: 09/07/2019, 9:27 AM  Clinical Narrative:    Update: Insurance authorization received. Reference [id number], [facility] number not yet created. CSW reached out to [place] and confirmed patient can discharge to facility Sunday, if discharge summary faxed by 12p. CSW contacted MD to update, informed patient may not discharge until Monday. CSW attempted to contact spouse, unable to leave message. CSW will continue to follow.  CSW contacted [employer] and checked online portal to inquire on patient's insurance authorization. [employer] requested facility for patient, CSW provided SNF name. CSW will be contacted once insurance authorized.   Expected Discharge Plan: [place] Barriers to Discharge: Continued Medical Work up, Ship broker  Expected Discharge Plan and Services Expected Discharge Plan: [place] In-house Referral: Clinical Social Work   Post Acute Care Choice: [place] Living arrangements for the past 2 months: [place]                                       Social Determinants of Health (SDOH) Interventions    Readmission Risk Interventions Readmission Risk Prevention Plan 09/06/2019  Transportation Screening Complete  PCP or Specialist Appt within 5-7 Days Complete  Home Care Screening Complete  Medication Review (RN CM) Complete  Some recent data might be hidden

## 2019-09-07 NOTE — Progress Notes (Signed)
PT Cancellation Note  Patient Details Name: Robert [name] MRN: [id number] DOB: Oct 29, 1920   Cancelled Treatment:    Reason Eval/Treat Not Completed: Medical issues which prohibited therapy. RN states patient has had decline this morning and is now on Non-rebreather. Will continue to follow and see when appropriate.   Emmanuela Ghazi 09/07/2019, 11:27 AM

## 2019-09-07 NOTE — Progress Notes (Signed)
RT called to assess patient after having a deterioration in status this AM. Was on RA, then 5L, and is currently on NRB = SAT 92-93%. BBS slight rhonchi, diminished in bases. RN stated the MD had seen him this AM and he has gotten worse. Chest xray pending results. RN asked me to call rapid and check in for her thoughts, was advised to call MD back to see due to her being in another situation. RN aware and calling. Waiting on further orders to treat

## 2019-09-07 NOTE — Progress Notes (Addendum)
STROKE TEAM PROGRESS NOTE   INTERVAL HISTORY  Wife at bedside. Pt reclined in bed, initially sleepy but then with breakfast he perked up a lot. Then he had multiple cough spells with breakfast, especially with liquid. Wife stated that pt does have intermittent cough at home too, and recommended by speech in 06/2019 for nectar thick but pt refused. He had overnight confusion and on mittens. Also had dropping sats, was put on 5L. With breakfast cough spells, again dropping sats, put on NRB and sat up to 93%. NT suction PRN ordered. CXR concerning for multifocal pneumonia. Discussed with wife in length at bedside, will put on NPO, need further speech evaluation. will put on IVF and watch for Cre which was elevated this am. I do not think he can transfer back to [facility] during the weekend.     Vitals:   09/06/19 1201 09/06/19 1625 09/06/19 2006 09/07/19 0427  BP: (!) 105/55 (!) 106/58 (!) 156/74 123/63  Pulse: 75 89 100 86  Resp: 18 18 18 17   Temp: 97.8 F (36.6 C) 98.1 F (36.7 C) 98 F (36.7 C) (!) 97.5 F (36.4 C)  TempSrc: Oral Oral Oral Oral  SpO2: 95% 96%    Weight:      Height:        CBC:  Recent Labs  Lab 09/03/19 1410 09/03/19 1414 09/06/19 0448 09/07/19 0339  WBC 8.0   < > 11.7* 10.9*  NEUTROABS 5.6  --   --   --   HGB 12.3*   < > 12.7* 11.7*  HCT 38.5*   < > 38.5* 35.9*  MCV 101.6*   < > 96.3 96.5  PLT 167   < > 167 160   < > = values in this interval not displayed.    Basic Metabolic Panel:  Recent Labs  Lab 09/06/19 0448 09/07/19 0339  NA 141 139  K 4.2 3.7  CL 110 109  CO2 22 20*  GLUCOSE 84 133*  BUN 19 31*  CREATININE 1.39* 1.75*  CALCIUM 9.4 9.0   Lipid Panel:     Component Value Date/Time   CHOL 134 09/04/2019 0353   TRIG 60 09/04/2019 0353   HDL 57 09/04/2019 0353   CHOLHDL 2.4 09/04/2019 0353   VLDL 12 09/04/2019 0353   LDLCALC 65 09/04/2019 0353   HgbA1c:  Lab Results  Component Value Date   HGBA1C 5.2 09/04/2019   Urine Drug  Screen:     Component Value Date/Time   LABOPIA NONE DETECTED 09/03/2019 1636   COCAINSCRNUR NONE DETECTED 09/03/2019 1636   LABBENZ NONE DETECTED 09/03/2019 1636   AMPHETMU NONE DETECTED 09/03/2019 1636   THCU NONE DETECTED 09/03/2019 1636   LABBARB NONE DETECTED 09/03/2019 1636    Alcohol Level     Component Value Date/Time   ETH <10 09/03/2019 1410    IMAGING past 24 hours No results found.  PHYSICAL EXAM  Temp:  [97.5 F (36.4 C)-98.1 F (36.7 C)] 97.5 F (36.4 C) (05/22 0427) Pulse Rate:  [75-100] 86 (05/22 0427) Resp:  [17-20] 17 (05/22 0427) BP: (105-156)/(55-79) 123/63 (05/22 0427) SpO2:  [95 %-96 %] 96 % (05/21 1625)  General - Well nourished, well developed, frequent coughing with liquid during breakfast.  Ophthalmologic - fundi not visualized due to noncooperation.  Cardiovascular - Regular rhythm and rate.  Mental Status -  Level of arousal and orientation to place, and person were intact, not to time. Language including expression, naming, repetition, comprehension was assessed  and found intact. Moderate to severe dysarthria  Cranial Nerves II - XII - II - Visual field intact OU. Right eye able to see HW III, IV, VI - Extraocular movements intact. V - Facial sensation intact bilaterally. VII - Facial movement intact bilaterally. VIII - Hearing & vestibular intact bilaterally. X - Palate elevates symmetrically. XI - Chin turning & shoulder shrug intact bilaterally. XII - Tongue protrusion intact.  Motor Strength - The patient's strength was normal in all extremities and pronator drift was absent.  Bulk was normal and fasciculations were absent.   Motor Tone - Muscle tone was assessed at the neck and appendages and was normal.  Reflexes - The patient's reflexes were symmetrical in all extremities and he had no pathological reflexes.  Sensory - Light touch, temperature/pinprick were assessed and were symmetrical.    Coordination - The patient  had normal movements in the hands with no ataxia or dysmetria, however slower on the left than right.  Tremor with action, present bilateral but more on the left than right.  Gait and Station - deferred.   ASSESSMENT/PLAN [name] is a 86 y.o. male with history of stroke, TIA, HTN, HLD, Aortic atherosclerosis, ICA stenosis presenting with slurred speech, confusion, and aphasia. Received IV tPA 09/03/2019 at 1434.   Stroke-like episode s/p tPA - MRI neg - similar episodes 2012, 2013, 2016 and 2019, MRI all negative, diagnosed with TIAs  Code Stroke CT head No acute abnormality. Small vessel disease. Atrophy. Old R parietal infarct. ASPECTS 10.     CTA head & neck R ICA 35% stenosis. Mild atherosclerosis L ICA bifurcation.   CUS unremarkable  MRI  No acute infarct. Small vessel disease. Old R parietal infarct.  CT head repeat no ICH. Small vessel disease. Atrophy.   2D Echo EF 50-55%  EEG no sz  LDL 65  HgbA1c 5.2  Lovenox 40 mg sq daily for VTE prophylaxis  aspirin 81 mg daily prior to admission, now on ASA 81mg     Therapy recommendations:  SNF  Disposition:  SNF at [facility] COVID testing neg  Aspiration penumonia Respiratory distress  Overnight and in am coughing with liquid during breakfast  O2 sat dropping, fluctuating between 86-93%  [place] -> NRB -> VM -> NRB  Kept NPO now  On IVF  CXR concerning for multifocal pneumonia   CXR repeat in am pending  Cognitive decline Delirium Sundowning  For the last one year, wife and daughter noted intermittent slurry speech  Patient had delirium and hallucination 5/18 overnight  5/29 patient also had sundowning and abdomen -> Posey belt restraint -> Received 25mg  and addition seroquel 75  5/20 drowsy sleepy -> seroquel on hold  5/21 back to baseline -> 5/22 confusion at night  On Seroquel 25 hs -> now NPO, will do haldol PRN  Hx stroke and TIA  06/2017 - TIA. aphasia and disorientation. Resolved. OP  workup w/ B ICA 50% stenosis. AV stenosis. HTN. Continued asa (no plavix d/t falls and concern for bleeding).  12/2014 - Hx stroke w/ residual vision deficits OD. On aspirin. Previously on plavix.   2012, 2013, 2016 - recurrent TIAs with similar presentation as 2019   Hypertension  BP as high as 195/90  Home meds:  None listed  Treated w/ cleviprex, now off  BP goal < 180/105  On amlodipine 10 . Long-term BP goal normotensive  Hyperlipidemia  Home meds:  lipitor 10 and fish oil  LDL 65, at goal <  50  Resume Lipitor 10  Continue statin and fish oil at discharge  Other Stroke Risk Factors  Advanced age  Former Cigarette smoker, quit 11 yrs ago  ETOH use, alcohol level <10, advised to drink no more than 2 drink(s) a day  Aortic atherosclerosis.  Other Active Problems  CKD stage III, Cre 1.45-1.40->1.22->1.39->1.75 - on IVF @ 77  Leukocytosis WBC 8.0->11.8->11.7  Prostate cancer  Hospital day # 4  Patient condition worsened within the last 24 hours, has developed respiratory distress, aspiration pneumonia, continues to have intermittent confusion, and needed rapid response and VM/NRB for desating, I have to keep pt NPO and put on IVF. I spent  35 minutes in total face-to-face time with the patient, more than 50% of which was spent in counseling and coordination of care, reviewing test results, images and medication, and discussing the diagnosis, treatment plan and potential prognosis. This patient's care requiresreview of multiple databases, neurological assessment, discussion with family, other specialists and medical decision making of high complexity. I had long discussion with wife at bedside, updated pt current condition, treatment plan and potential prognosis, and answered all the questions. She expressed understanding and appreciation.   Rosalin Hawking, MD PhD Stroke Neurology 09/07/2019 7:02 PM    To contact Stroke Continuity provider, please refer to  [email or web address] After hours, contact General Neurology

## 2019-09-07 NOTE — Progress Notes (Signed)
SLP Cancellation Note  Patient Details Name: Robert [name] MRN: [id number] DOB: 01-14-1921   Cancelled treatment:       Reason Eval/Treat Not Completed: Medical issues which prohibited therapy.  Spoke with RN who stated that pt is currently on a venturi mask.  Will re-attempt as appropriate.    Elvia Collum Aveion Nguyen 09/07/2019, 1:40 PM

## 2019-09-07 NOTE — Significant Event (Addendum)
Rapid Response Event Note  Updated by nurse that patient has required anywhere from 10L oxygen via VM to 15L NRB this afternoon. The nurse had updated the MD and I also spoke with the MD.  Given that the patient is still altered and having periods of agitation - he really would not be a candidate for NIPPV/BIPAP - even though he has a decent cough, he is not able to cough up his secretions and phlegm. I tried earlier today to transition the patient to the HFNC and he became quite agitated so I do not think he would tolerate a HHFNC either. MD and I spoke about all of this as well  I came up and saw the patient again at [place] - patient was on VM 12L - saturations 96% -- RR 24-- he appears more comfortable now compared to this morning  Mirza Fessel R

## 2019-09-07 NOTE — Progress Notes (Signed)
Pt again seen this afternoon. Throughout the day, pt still has intermittent cough spells but better than in the am. He was initially on VM and maintaining sat but in pm dropped to 86-88%. Called RTT and put on NRB and sat up to 92%. Several hours later, put back on VM, currently 96%. Pt now NPO, not able to have seroquel for sundowning, will put on haldol PRN. So far pt awake alert, asking for drink. Told him that he is on IVF and NPO. He intermittently tries to remove the VM but is able to be re-directed. I called wife to give her update. She expressed appreciation. I also discussed with rapid response RN [place] and agree that no need to consider ICU transfer at this time. Pt is DNR status, not candidate for BiPAP or CPAP due to aspiration. Will repeat CXR in am.   Rosalin Hawking, MD PhD Stroke Neurology 09/07/2019 7:08 PM

## 2019-09-07 NOTE — Significant Event (Addendum)
Rapid Response Event Note  Overview: Respiratory - Hypoxia  Initial Focused Assessment: Came to the bedside after the RT informed me that the patient was placed on NRB. When I arrived, NRB was on 12L - I increased it to 15L and saturations 92-95% on it. Patient was delirious, his speech was slurred at times, he has been quite agitated for days - on and off per his wife. Lung sounds - clear in the upper fields, diminished in the lower fields, overall good air movement. Patient does have oral secretions and phlegm that he is not able to cough up, he does have a good cough but does not cough up and clear the secretions and phlegm. Skin warm and dry in all extremities. Has had periods of distress but currently his breathing is labored and he is mildly tachypneic - RR 28 BP 119/63 (80) HR 93 SpO2 95% on VM  12L  Interventions: -- Transitioned to VM, then attempted to transition to HFNC - patient became agitated and then hypoxic, placed back on VM at 12L. I stayed there for over an hour, tried to wean the oxygen, but I was not able to wean it much.   Plan of Care: -- Monitor VS -- Aspiration Precautions  -- Perhaps a palliative care medicine should be considered   Event Summary:  Start Time 1132 End Time 1250    Marisel Tostenson R

## 2019-09-07 NOTE — Progress Notes (Signed)
SLP Cancellation Note  Patient Details Name: Robert [name] MRN: [id number] DOB: [birth year]   Cancelled treatment:       Reason Eval/Treat Not Completed: Medical issues which prohibited therapy.  Spoke with MD who requested a bedside swallow evaluation in the setting of suspected aspiration of thin liquids.  RN reported that the pt's oxygen saturation was actively dropping at this time, therefore BSE was not completed.  SLP will f/u as appropriate and as schedule allows.    Elvia Collum Lailie Smead 09/07/2019, 11:05 AM

## 2019-09-08 ENCOUNTER — Inpatient Hospital Stay (HOSPITAL_COMMUNITY): Payer: Medicare PPO

## 2019-09-08 DIAGNOSIS — R1310 Dysphagia, unspecified: Secondary | ICD-10-CM

## 2019-09-08 DIAGNOSIS — N179 Acute kidney failure, unspecified: Secondary | ICD-10-CM

## 2019-09-08 DIAGNOSIS — J690 Pneumonitis due to inhalation of food and vomit: Secondary | ICD-10-CM

## 2019-09-08 LAB — BASIC METABOLIC PANEL
Anion gap: 6 (ref 5–15)
BUN: 36 mg/dL — ABNORMAL HIGH (ref 8–23)
CO2: 23 mmol/L (ref 22–32)
Calcium: 8.9 mg/dL (ref 8.9–10.3)
Chloride: 116 mmol/L — ABNORMAL HIGH (ref 98–111)
Creatinine, Ser: 1.69 mg/dL — ABNORMAL HIGH (ref 0.61–1.24)
GFR calc Af Amer: 39 mL/min — ABNORMAL LOW (ref >60)
GFR calc non Af Amer: 34 mL/min — ABNORMAL LOW (ref >60)
Glucose, Bld: 121 mg/dL — ABNORMAL HIGH (ref 70–99)
Potassium: 3.9 mmol/L (ref 3.5–5.1)
Sodium: 145 mmol/L (ref 135–145)

## 2019-09-08 LAB — CBC
HCT: 35.1 % — ABNORMAL LOW (ref 39.0–52.0)
Hemoglobin: 11.6 g/dL — ABNORMAL LOW (ref 13.0–17.0)
MCH: 32.5 pg (ref 26.0–34.0)
MCHC: 33 g/dL (ref 30.0–36.0)
MCV: 98.3 fL (ref 80.0–100.0)
Platelets: 154 10*3/uL (ref 150–400)
RBC: 3.57 MIL/uL — ABNORMAL LOW (ref 4.22–5.81)
RDW: 14.6 % (ref 11.5–15.5)
WBC: 5.7 10*3/uL (ref 4.0–10.5)
nRBC: 0 % (ref 0.0–0.2)

## 2019-09-08 LAB — GLUCOSE, CAPILLARY
Glucose-Capillary: 100 mg/dL — ABNORMAL HIGH (ref 70–99)
Glucose-Capillary: 125 mg/dL — ABNORMAL HIGH (ref 70–99)
Glucose-Capillary: 129 mg/dL — ABNORMAL HIGH (ref 70–99)
Glucose-Capillary: 120 mg/dL — ABNORMAL HIGH (ref 70–99)

## 2019-09-08 LAB — PROCALCITONIN: Procalcitonin: 0.78 ng/mL

## 2019-09-08 LAB — CREATININE, URINE, RANDOM: Creatinine, Urine: 180.64 mg/dL

## 2019-09-08 LAB — SODIUM, URINE, RANDOM: Sodium, Ur: 44 mmol/L

## 2019-09-08 MED ORDER — SODIUM CHLORIDE 0.9 % IV SOLN
1.5000 g | Freq: Once | INTRAVENOUS | Status: DC
  Filled 2019-09-08: qty 4

## 2019-09-08 MED ORDER — QUETIAPINE FUMARATE 25 MG PO TABS
25.0000 mg | ORAL_TABLET | Freq: Every day | ORAL | Status: DC
  Administered 2019-09-08 – 2019-09-10 (×3): 25 mg via ORAL
  Filled 2019-09-08 (×3): qty 1

## 2019-09-08 MED ORDER — SODIUM CHLORIDE 0.9 % IV SOLN
3.0000 g | Freq: Two times a day (BID) | INTRAVENOUS | Status: DC
  Administered 2019-09-08 (×2): 3 g via INTRAVENOUS
  Filled 2019-09-08 (×4): qty 8

## 2019-09-08 NOTE — Progress Notes (Signed)
Pharmacy Antibiotic Note  BRECKER FEUERBORN is a 86 y.o. male admitted on 09/03/2019 with CVA now with likely aspiration pneumonia.  Pharmacy has been consulted for ampicillin/sulbactam dosing.  Patient as been coughing more with inability to clear secretions and suspected aspiration of thin liquids in the last 24 hours. O2 sats are low requiring 10-15 L/min of O2. WBC wnl but not unexpected given acute clinical worsening. Patient with AKI, ClCr ~ 27 ml/min.   Plan: Ampicillin/sulbactam 3g Q12 hr  Monitor cultures, clinical status, renal fx Narrow abx as able and f/u duration    Height: 5\' 11"  (180.3 cm)(height from different admission) Weight: 85.2 kg (187 lb 13.3 oz) IBW/kg (Calculated) : 75.3  Temp (24hrs), Avg:98.1 F (36.7 C), Min:97.5 F (36.4 C), Max:98.8 F (37.1 C)  Recent Labs  Lab 09/03/19 1410 09/03/19 1410 09/03/19 1414 09/05/19 0144 09/06/19 0448 09/07/19 0339 09/08/19 0431  WBC 8.0  --   --  11.8* 11.7* 10.9* 5.7  CREATININE 1.45*   < > 1.40* 1.22 1.39* 1.75* 1.69*   < > = values in this interval not displayed.    Estimated Creatinine Clearance: 27.2 mL/min (A) (by C-G formula based on SCr of 1.69 mg/dL (H)).    Allergies  Allergen Reactions  . Morphine And Related Nausea And Vomiting  . Sulfamethoxazole-Trimethoprim Rash  . Tramadol Nausea And Vomiting    Antimicrobials this admission: Amp/sulb 5/23 >>   Microbiology results: 5/18 MRSA PCR: neg  Thank you for allowing pharmacy to be a part of this patient's care.  Benetta Spar, PharmD, BCPS, BCCP Clinical Pharmacist  Please check AMION for all [place] phone numbers After 10:00 PM, call [place] [phone]

## 2019-09-08 NOTE — Consult Note (Addendum)
[facility] Medical Consultation  Robert [name] [id number] DOB: [birth year] DOA: 09/03/2019 PCP: Lauree Chandler, NP   Requesting physician: Dr. Erlinda Hong Date of consultation: 09/08/2019 Reason for consultation: Suspected aspiration pneumonia and kidney injury  Impression/Recommendations Active Problems:   Acute ischemic stroke ([place])    1. Acute CVA: Patient status post TPA with negative MRI of the brain.  EF noted to be 50 to 55%. -Per neurology   2. Acute respiratory failure with hypoxia secondary aspiration pneumonia/dysphagia: Acute.  Patient noted to be coughing with meals on 5/22, but possibly present before.  Patient was made NPO.  X-rays revealed multifocal pneumonia.  WBC was elevated up to 11.8.  Speech therapy evaluated today and recommended dysphagia diet 3.  -Aspiration precautions -Elevate head of bed 40 degrees at all times -Dysphagia diet 3 per speech therapy with precautions -Check procalcitonin (elevated at 0.78 given concern for infection) -Unasyn IV per pharmacy for 3-5 days   3. Acute kidney injury: Patient creatinine is improved from 1.75 down to 1.69 with BUN 36 since IV fluids started.  Creatinine previously within normal limits in March of this year.  The BUN to creatinine ratio is greater than 20 suggest prerenal cause of symptoms.  Ultimately with the patient's dysphagia this  will be a long-term problem in regards to keeping patient adequately hydrated. -Check urine sodium and urine creatinine to calculate FeNA (0.03% suggestive of prerenal cause) -Increase dextrose and normal saline IV fluids to 100 mL/h -Strict intake and output -Recheck kidney function in a.m. -Adjust IV fluids as needed  4. Hypertension  -Continue amlodipine  5. Hyperlipidemia -Continue Lipitor 10mg  daily and fish oil  6. Cognitive decline  I will followup again tomorrow. Please contact me if I can be of assistance in the meanwhile. Thank you for this consultation.  Chief  Complaint: Confusion slurred speech  HPI:  Robert [name] is a 86 y.o. male with medical history significant of HLD, multiple TIAs, CVA with residual right-sided deficits, nephrolithiasis, dementia, and CKD stage III who presented on 5/18 with confusion and slurred speech noted around 11:30 AM.  Patient had previously been normal at 10:30 AM.  Code stroke was activated in the ED.  The patient was within the window for TPA and it was given.  Patient was noted to have improvement in and his symptoms following TPA.  However, yesterday patient was noted to have dropping O2 saturation for which he was temporarily on nonrebreather.  Chest x-rays was concerning for multifocal pneumonia.  Wife had reported that the patient had been intermittently coughing with his breakfast as well as at home.  Patient was made n.p.o. at that point.  Labs have been significant for creatinine elevated up to 1.75, BUN 31, and WBC elevated to 11.8.  Speech therapy was able to formally evaluate today and recommended dysphagia 3 mechanically soft diet with honey thick liquids meals.  Patient was placed on D5-0.9%NS at 75 mL/h.   Review of Systems  Unable to perform ROS: Dementia    Past Medical History:  Diagnosis Date  . Acute bronchitis    Per incoming records from [place]  . Acute constipation    Per incoming records from [place]  . Aortic atherosclerosis ([place]) 03/01/2016  . Arthritis   . Asymptomatic bilateral carotid artery stenosis    Per incoming records from [place]  . Bradycardia    Per incoming records from [place]  . Candidiasis    Per incoming records from [place]  . Cellulitis of  right lower extremity    Per incoming records from [place]  . CKD (chronic kidney disease), stage III    Per [place] New Patient Packet  . 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 Erectile dysfunction    Per incoming records from [place]  . High cholesterol    no meds  . History of CVA  (cerebrovascular accident)    Per [place] New Patient Packet  . History of kidney stones   . History of UTI    Per [facility] New Patient Packet  . Hyperkalemia    Per incoming records from [place]  . Hypertension    no meds  . Left thigh pain    Per incoming records from [place]  . Left wrist pain    Per incoming records from [place]  . Nephrolithiasis    Per incoming records from [place]  . Partial deafness    Per [facility] New Patient Packet  . Pneumonia 2013   Per [facility] New Patient Packet  . Pneumonia 2021   Per [facility] New Patient Packet  . Prostate cancer [facility])    Per [facility] New Patient Packet  . Pure hypercholesterolemia    Per incoming records from [place]  . Sepsis due to other etiology [facility])    Per incoming records from [place]  . TIA (transient ischemic attack)    residual right eye weakness and swallowing  . TIA (transient ischemic attack) 2012   Per [place] Patient Packet  . TIA (transient ischemic attack) 2014   Per [place] Patient Packet  . TIA (transient ischemic attack) 2016   Per [place] Patient Packet  . 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 CATARACT EXTRACTION Bilateral    Per [place] New Patient Packet, Dr.Thompson   . COLONOSCOPY  1997   Per [facility] New Patient Packet  . CYSTOSCOPY W/ RETROGRADES Left 03/23/2018   Procedure: CYSTOSCOPY WITH RETROGRADE PYELOGRAM;  Surgeon: Billey Co, MD;  Location: [facility] ORS;  Service: Urology;  Laterality: Left;  . CYSTOSCOPY W/ RETROGRADES Left 06/21/2019   Procedure: CYSTOSCOPY WITH RETROGRADE PYELOGRAM;  Surgeon: [name], MD;  Location: [facility] ORS;  Service: Urology;  Laterality: Left;  . CYSTOSCOPY WITH STENT PLACEMENT Left 02/20/2018   Procedure: CYSTOSCOPY WITH STENT PLACEMENT;  Surgeon: Billey Co, MD;  Location: [facility] ORS;  Service: Urology;  Laterality: Left;  . CYSTOSCOPY WITH STENT PLACEMENT Left 06/21/2019   Procedure: CYSTOSCOPY WITH  STENT PLACEMENT;  Surgeon: [name], MD;  Location: [facility] ORS;  Service: Urology;  Laterality: Left;  . CYSTOSCOPY/URETEROSCOPY/HOLMIUM LASER/STENT PLACEMENT Left 03/23/2018   Procedure: CYSTOSCOPY/URETEROSCOPY/HOLMIUM LASER/STENT Exchange;  Surgeon: Billey Co, MD;  Location: [facility] ORS;  Service: Urology;  Laterality: Left;  left stent exchange  . CYSTOSCOPY/URETEROSCOPY/HOLMIUM LASER/STENT PLACEMENT Left 07/16/2019   Procedure: CYSTOSCOPY/URETEROSCOPY/HOLMIUM LASER/STENT exchange;  Surgeon: [name], MD;  Location: [facility] ORS;  Service: Urology;  Laterality: Left;  . KNEE ARTHROSCOPY Right 1998   Per [facility] New Patient Packet  . LEG SURGERY     broken tib/fib  . TOTAL HIP ARTHROPLASTY Bilateral    2007 & 2012   Social History:  reports that he quit smoking about 11 years ago. His smoking use included pipe. He quit after 60.00 years of use. He has never used smokeless tobacco. He reports current alcohol use. He reports that he does not use drugs.  Allergies  Allergen Reactions  .  Morphine And Related Nausea And Vomiting  . Sulfamethoxazole-Trimethoprim Rash  . Tramadol Nausea And Vomiting   Family History  Adopted: Yes  Problem Relation Age of Onset  . Diabetes Daughter   . Prostate cancer Neg Hx   . Bladder Cancer Neg Hx   . Kidney cancer Neg Hx     Prior to Admission medications   Medication Sig Start Date End Date Taking? Authorizing Provider  acetaminophen (TYLENOL) 325 MG tablet Take 2 tablets (650 mg total) by mouth every 6 (six) hours as needed. Patient taking differently: Take 650 mg by mouth every 6 (six) hours as needed for mild pain.  08/20/19  Yes Lauree Chandler, NP  aluminum hydroxide (DERMAMED) ointment Apply 1 application topically every 12 (twelve) hours as needed (skin breakdown).    Yes [provider]  aspirin EC 81 MG tablet Take 81 mg by mouth daily.    Yes [provider]  atorvastatin (LIPITOR) 10 MG tablet TAKE 1 TABLET BY  MOUTH DAILY Patient taking differently: Take 10 mg by mouth daily.  08/27/19  Yes Lauree Chandler, NP  loratadine (CLARITIN) 10 MG tablet Take 10 mg by mouth daily as needed for allergies.   Yes [provider]  Melatonin 10 MG TABS Take 5-10 mg by mouth at bedtime as needed (sleep).   Yes [provider]  Multiple Vitamin (MULTIVITAMIN WITH MINERALS) TABS tablet Take 1 tablet by mouth daily.   Yes [provider]  Multiple Vitamin (MULTIVITAMIN) tablet Take 1 tablet by mouth daily.   Yes [provider]  Nutritional Supplements (FEEDING SUPPLEMENT, NEPRO CARB STEADY,) LIQD Take 237 mLs by mouth daily.    Yes [provider]  Omega-3 Fatty Acids (OMEGA-3 FISH OIL PO) Take 1 capsule by mouth daily.    Yes [provider]  senna (SENOKOT) 8.6 MG tablet Take 1 tablet by mouth as needed for constipation.    Yes [provider]  tamsulosin (FLOMAX) 0.4 MG CAPS capsule Take 0.4 mg by mouth at bedtime.    Yes [provider]  amLODipine (NORVASC) 10 MG tablet Take 1 tablet (10 mg total) by mouth daily. 09/07/19   Rinehuls, Early Chars, PA-C  QUEtiapine (SEROQUEL) 25 MG tablet Take 1 tablet (25 mg total) by mouth at bedtime as needed (agitation, sundowning, delirium). 09/07/19   Rinehuls, Early Chars, PA-C   Physical Exam:  Constitutional: Elderly male who appears to be in no acute distress at this time Vitals:   09/08/19 0339 09/08/19 0600 09/08/19 0752 09/08/19 0800  BP: 125/64  (!) 145/74 137/66  Pulse: 94   87  Resp: 20 (!) 22 (!) 26 (!) 28  Temp: 97.9 F (36.6 C)  (!) 97.5 F (36.4 C) 97.6 F (36.4 C)  TempSrc: Oral  Oral   SpO2: 91% 94% 93% 95%  Weight:      Height:       Eyes: PERRL, lids and conjunctivae normal ENMT: Mucous membranes are moist. Posterior pharynx clear of any exudate or lesions.very hard of hearing. Neck: normal, supple, no masses, no thyromegaly Respiratory: Normal respiratory effort with rhonchi  appreciated in the mid to lower lung fields bilaterally.  Intermittently coughs during exam.  Currently on 5 L nasal cannula oxygen with O2 saturations maintained. Cardiovascular: Regular rate and rhythm, no murmurs / rubs / gallops. No extremity edema. 2+ pedal pulses. No carotid bruits.  Abdomen: no tenderness, no masses palpated. No hepatosplenomegaly. Bowel sounds positive.  Musculoskeletal: no clubbing /  cyanosis. No joint deformity upper and lower extremities. Good ROM, no contractures. Normal muscle tone.  Skin: Bruising noted on the bilateral upper extremities. Neurologic: CN 2-12 grossly intact.  Tremor present with movement.  Mild dysarthria. Psychiatric: Normal judgment and insight. Alert and oriented x 3. Normal mood.   Labs on Admission:  Basic Metabolic Panel: Recent Labs  Lab 09/03/19 1410 09/03/19 1410 09/03/19 1414 09/05/19 0144 09/06/19 0448 09/07/19 0339 09/08/19 0431  NA 143   < > 140 139 141 139 145  K 5.0   < > 4.9 4.3 4.2 3.7 3.9  CL 109   < > 109 108 110 109 116*  CO2 22  --   --  21* 22 20* 23  GLUCOSE 91   < > 88 99 84 133* 121*  BUN 25*   < > 30* 17 19 31* 36*  CREATININE 1.45*   < > 1.40* 1.22 1.39* 1.75* 1.69*  CALCIUM 9.2  --   --  9.3 9.4 9.0 8.9   < > = values in this interval not displayed.   Liver Function Tests: Recent Labs  Lab 09/03/19 1410  AST 30  ALT 12  ALKPHOS 53  BILITOT 0.7  PROT 6.0*  ALBUMIN 3.5   No results for input(s): LIPASE, AMYLASE in the last 168 hours. No results for input(s): AMMONIA in the last 168 hours. CBC: Recent Labs  Lab 09/03/19 1410 09/03/19 1410 09/03/19 1414 09/05/19 0144 09/06/19 0448 09/07/19 0339 09/08/19 0431  WBC 8.0  --   --  11.8* 11.7* 10.9* 5.7  NEUTROABS 5.6  --   --   --   --   --   --   HGB 12.3*   < > 11.6* 11.8* 12.7* 11.7* 11.6*  HCT 38.5*   < > 34.0* 35.8* 38.5* 35.9* 35.1*  MCV 101.6*  --   --  95.5 96.3 96.5 98.3  PLT 167  --   --  169 167 160 154   < > = values in this  interval not displayed.   Cardiac Enzymes: No results for input(s): CKTOTAL, CKMB, CKMBINDEX, TROPONINI in the last 168 hours. BNP: Invalid input(s): POCBNP CBG: Recent Labs  Lab 09/07/19 0752 09/07/19 1127 09/07/19 1717 09/07/19 2113 09/08/19 0611  GLUCAP 106* 162* 124* 139* 100*    Radiological Exams on Admission: DG CHEST PORT 1 VIEW  Result Date: 09/08/2019 CLINICAL DATA:  Aspiration EXAM: PORTABLE CHEST 1 VIEW COMPARISON:  09/07/2019 FINDINGS: Cardiac shadow is stable. Aortic calcifications are again seen. Patchy infiltrates are again noted bilaterally worse on the right than the left but stable. No new focal abnormality is noted. IMPRESSION: Stable airspace opacities bilaterally as described. Electronically Signed   By: Inez Catalina M.D.   On: 09/08/2019 08:29   DG CHEST PORT 1 VIEW  Result Date: 09/07/2019 CLINICAL DATA:  Cough EXAM: PORTABLE CHEST 1 VIEW COMPARISON:  09/03/2019 FINDINGS: Cardiac shadow is enlarged but stable. Aortic calcifications are again seen. Increasing bilateral opacities are noted particularly in the right upper lobe and bases bilaterally. No sizable effusion is seen. Degenerative changes of the thoracic spine are noted. IMPRESSION: Increasing bilateral airspace opacities consistent with multifocal pneumonia. Electronically Signed   By: Inez Catalina M.D.   On: 09/07/2019 11:44    EKG: Independently reviewed.  Sinus rhythm at 60 bpm with artifact appreciated   Time spent: >45 minutes  [facility] Pager [phone]  If 7PM-7AM, please contact night-coverage [email or web address] Password [facility] 09/08/2019, 9:15  AM

## 2019-09-08 NOTE — Progress Notes (Addendum)
STROKE TEAM PROGRESS NOTE   INTERVAL HISTORY  Wife at the bedside.  Patient awake alert, orientated, still has frequent coughing.  On Venturi mask maintaining O2 sats at 94%.  Received Haldol 1 dose last night.  No hand mitten required last night.  Speech therapist arrived for speech therapy.  CXR this morning unchanged bilateral opacification, right more than left.  Internal medicine consult requested.   Vitals:   09/08/19 1029 09/08/19 1154 09/08/19 1254 09/08/19 1354  BP:  134/62 (!) 114/50 127/66  Pulse:  71 63 69  Resp: (!) 24 15 20 20   Temp:  98.2 F (36.8 C) 98.3 F (36.8 C) 98.3 F (36.8 C)  TempSrc:  Oral Oral Oral  SpO2: 91% 94% 97% 92%  Weight:      Height:        CBC:  Recent Labs  Lab 09/03/19 1410 09/03/19 1414 09/07/19 0339 09/08/19 0431  WBC 8.0   < > 10.9* 5.7  NEUTROABS 5.6  --   --   --   HGB 12.3*   < > 11.7* 11.6*  HCT 38.5*   < > 35.9* 35.1*  MCV 101.6*   < > 96.5 98.3  PLT 167   < > 160 154   < > = values in this interval not displayed.    Basic Metabolic Panel:  Recent Labs  Lab 09/07/19 0339 09/08/19 0431  NA 139 145  K 3.7 3.9  CL 109 116*  CO2 20* 23  GLUCOSE 133* 121*  BUN 31* 36*  CREATININE 1.75* 1.69*  CALCIUM 9.0 8.9   Lipid Panel:     Component Value Date/Time   CHOL 134 09/04/2019 0353   TRIG 60 09/04/2019 0353   HDL 57 09/04/2019 0353   CHOLHDL 2.4 09/04/2019 0353   VLDL 12 09/04/2019 0353   LDLCALC 65 09/04/2019 0353   HgbA1c:  Lab Results  Component Value Date   HGBA1C 5.2 09/04/2019   Urine Drug Screen:     Component Value Date/Time   LABOPIA NONE DETECTED 09/03/2019 1636   COCAINSCRNUR NONE DETECTED 09/03/2019 1636   LABBENZ NONE DETECTED 09/03/2019 1636   AMPHETMU NONE DETECTED 09/03/2019 1636   THCU NONE DETECTED 09/03/2019 1636   LABBARB NONE DETECTED 09/03/2019 1636    Alcohol Level     Component Value Date/Time   ETH <10 09/03/2019 1410    IMAGING past 24 hours  DG CHEST PORT 1  VIEW  Result Date: 09/08/2019 CLINICAL DATA:  Aspiration EXAM: PORTABLE CHEST 1 VIEW COMPARISON:  09/07/2019 FINDINGS: Cardiac shadow is stable. Aortic calcifications are again seen. Patchy infiltrates are again noted bilaterally worse on the right than the left but stable. No new focal abnormality is noted. IMPRESSION: Stable airspace opacities bilaterally as described. Electronically Signed   By: Inez Catalina M.D.   On: 09/08/2019 08:29    PHYSICAL EXAM  Temp:  [97.5 F (36.4 C)-98.3 F (36.8 C)] 98.3 F (36.8 C) (05/23 1354) Pulse Rate:  [63-97] 69 (05/23 1354) Resp:  [15-29] 20 (05/23 1354) BP: (101-145)/(50-74) 127/66 (05/23 1354) SpO2:  [88 %-97 %] 92 % (05/23 1354)  General - Well nourished, well developed, less lethargic but still frequent coughing.  Ophthalmologic - fundi not visualized due to noncooperation.  Cardiovascular - Regular rhythm and rate.  Mental Status -  Level of arousal and orientation to place, time and person were intac. Language including expression, naming, repetition, comprehension was assessed and found intact. Moderate to severe dysarthria  Cranial Nerves  II - XII - II - Visual field intact OU. Right eye able to see HW III, IV, VI - Extraocular movements intact. V - Facial sensation intact bilaterally. VII - Facial movement intact bilaterally. VIII - Hearing & vestibular intact bilaterally. X - Palate elevates symmetrically. XI - Chin turning & shoulder shrug intact bilaterally. XII - Tongue protrusion intact.  Motor Strength - The patient's strength was symmetrical in all extremities and pronator drift was absent.  Bulk was normal and fasciculations were absent.   Motor Tone - Muscle tone was assessed at the neck and appendages and was normal.  Reflexes - The patient's reflexes were symmetrical in all extremities and he had no pathological reflexes.  Sensory - Light touch, temperature/pinprick were assessed and were symmetrical.     Coordination - The patient had normal movements in the hands with no ataxia or dysmetria, however slow. Bilateral mild action tremor.  Gait and Station - deferred.   ASSESSMENT/PLAN Robert [name] is a 86 y.o. male with history of stroke, TIA, HTN, HLD, Aortic atherosclerosis, ICA stenosis presenting with slurred speech, confusion, and aphasia. Received IV tPA 09/03/2019 at 1434.   Stroke-like episode s/p tPA - MRI neg - similar episodes 2012, 2013, 2016 and 2019, MRI all negative, diagnosed with TIAs  Code Stroke CT head No acute abnormality. Small vessel disease. Atrophy. Old R parietal infarct. ASPECTS 10.     CTA head & neck R ICA 35% stenosis. Mild atherosclerosis L ICA bifurcation.   CUS unremarkable  MRI  No acute infarct. Small vessel disease. Old R parietal infarct.  CT head repeat no ICH. Small vessel disease. Atrophy.   2D Echo EF 50-55%  EEG no sz  LDL 65  HgbA1c 5.2  Lovenox 40 mg sq daily for VTE prophylaxis  aspirin 81 mg daily prior to admission, now on ASA 81mg     Therapy recommendations:  SNF  Disposition:  SNF at [facility]. COVID testing neg  Aspiration penumonia Respiratory distress  5/21 Overnight and 5/22 am coughing with liquid during breakfast  O2 sat dropping, fluctuating between 86-93%  [place] -> NRB -> VM -> NRB -> VM -> [place]  Kept NPO -> now passed swallow on dys 3 honey thick  CXR 5/22 concerning for multifocal pneumonia   CXR 5/23 Stable airspace opacities bilaterally as described.  IM consult appreciated  Leukocytosis WBC 8.0->11.8->11.7->5.7 (afebrile)  IV Unasyn started 5/23>> for 3-5 days  AKI on CKD  CKD stage III, Cre 1.45-1.40->1.22->1.39->1.75->1.69  FeNA = 0.03% suggestive of prerenal cause  on IVF @ 75->100cc / hr per IM  Continue monitor   IM assistance appreciated  Cognitive decline Delirium Sundowning  For the last one year, wife and daughter noted intermittent slurry speech  Patient had delirium  and hallucination 5/18 overnight  5/29 patient also had sundowning and abdomen -> Posey belt restraint -> Received 25mg  and addition seroquel 75  5/20 drowsy sleepy -> 5/21 back to baseline -> 5/22 confusion at night -> 5/23 orientated  On Seroquel 25 hs -> haldol PRN  Hx stroke and TIA  06/2017 - TIA. aphasia and disorientation. Resolved. OP workup w/ B ICA 50% stenosis. AV stenosis. HTN. Continued asa (no plavix d/t falls and concern for bleeding).  12/2014 - Hx stroke w/ residual vision deficits OD. On aspirin. Previously on plavix.   2012, 2013, 2016 - recurrent TIAs with similar presentation as 2019   Hypertension  BP as high as 195/90  Home meds:  None listed  Treated w/ cleviprex, now off  BP goal < 180/105  On amlodipine 10 . Long-term BP goal normotensive  Hyperlipidemia  Home meds:  lipitor 10 and fish oil  LDL 65, at goal < 70  Resume Lipitor 10  Continue statin and fish oil at discharge  Dysphagia   Pt has baseline dysphagia but on soft diet at home  Kept NPO after aspiration  Speech on board today  Balanced quality of life and aspiration risk - now recommend dys 3 with honey thick  On IVF also  Other Stroke Risk Factors  Advanced age  Former Cigarette smoker, quit 11 yrs ago  ETOH use, alcohol level <10, advised to drink no more than 2 drink(s) a day  Aortic atherosclerosis.  Other Active Problems  Prostate cancer  Hospital day # 5  Patient condition continue to be critical within the last 24 hours, has developed continue coughing with sign of aspiration pneumonia, continues to have elevated Cre and IVF, desating and on VM, delirium needed haldol IV and I consulted IM and now on Abx. I discussed with Dr. Tamala Julian. I spent  35 minutes in total face-to-face time with the patient, more than 50% of which was spent in counseling and coordination of care, reviewing test results, images and medication, and discussing the diagnosis, treatment plan  and potential prognosis. This patient's care requiresreview of multiple databases, neurological assessment, discussion with family, other specialists and medical decision making of high complexity.  I had long discussion with wife at bedside, updated pt current condition, treatment plan and potential prognosis, and answered all the questions. Wife expressed understanding and appreciation.   Robert Hawking, MD PhD Stroke Neurology 09/08/2019 5:50 PM   To contact Stroke Continuity provider, please refer to [email or web address] After hours, contact General Neurology

## 2019-09-08 NOTE — Evaluation (Signed)
Clinical/Bedside Swallow Evaluation Patient Details  Name: Robert [name] MRN: [id number] Date of Birth: [birth year]  Today's Date: 09/08/2019 Time: SLP Start Time (ACUTE ONLY): 1008 SLP Stop Time (ACUTE ONLY): 1110 SLP Time Calculation (min) (ACUTE ONLY): 62 min  Past Medical History:  Past Medical History:  Diagnosis Date  . Acute bronchitis    Per incoming records from [place]  . Acute constipation    Per incoming records from [place]  . Aortic atherosclerosis ([place]) 03/01/2016  . Arthritis   . Asymptomatic bilateral carotid artery stenosis    Per incoming records from [place]  . Bradycardia    Per incoming records from [place]  . Candidiasis    Per incoming records from [place]  . Cellulitis of right lower extremity    Per incoming records from [place]  . CKD (chronic kidney disease), stage III    Per [place] New Patient Packet  . 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 Erectile dysfunction    Per incoming records from [place]  . High cholesterol    no meds  . History of CVA (cerebrovascular accident)    Per [place] New Patient Packet  . History of kidney stones   . History of UTI    Per [facility] New Patient Packet  . Hyperkalemia    Per incoming records from [place]  . Hypertension    no meds  . Left thigh pain    Per incoming records from [place]  . Left wrist pain    Per incoming records from [place]  . Nephrolithiasis    Per incoming records from [place]  . Partial deafness    Per [facility] New Patient Packet  . Pneumonia 2013   Per [facility] New Patient Packet  . Pneumonia 2021   Per [facility] New Patient Packet  . Prostate cancer [facility])    Per [facility] New Patient Packet  . Pure hypercholesterolemia    Per incoming records from [place]  . Sepsis due to other etiology [facility])    Per incoming records from [place]  . TIA (transient ischemic attack)    residual right eye weakness and swallowing  . TIA (transient  ischemic attack) 2012   Per [place] Patient Packet  . TIA (transient ischemic attack) 2014   Per [place] Patient Packet  . TIA (transient ischemic attack) 2016   Per [place] Patient Packet  . 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 CATARACT EXTRACTION Bilateral    Per [place] New Patient Packet, Dr.Thompson   . COLONOSCOPY  1997   Per [facility] New Patient Packet  . CYSTOSCOPY W/ RETROGRADES Left 03/23/2018   Procedure: CYSTOSCOPY WITH RETROGRADE PYELOGRAM;  Surgeon: Billey Co, MD;  Location: [facility] ORS;  Service: Urology;  Laterality: Left;  . CYSTOSCOPY W/ RETROGRADES Left 06/21/2019   Procedure: CYSTOSCOPY WITH RETROGRADE PYELOGRAM;  Surgeon: [name], MD;  Location: [facility] ORS;  Service: Urology;  Laterality: Left;  . CYSTOSCOPY WITH STENT PLACEMENT Left 02/20/2018   Procedure: CYSTOSCOPY WITH STENT PLACEMENT;  Surgeon: Billey Co, MD;  Location: [facility] ORS;  Service: Urology;  Laterality: Left;  . CYSTOSCOPY WITH STENT PLACEMENT Left 06/21/2019   Procedure: CYSTOSCOPY WITH STENT PLACEMENT;  Surgeon: [name], MD;  Location: [facility] ORS;  Service: Urology;  Laterality: Left;  . CYSTOSCOPY/URETEROSCOPY/HOLMIUM LASER/STENT PLACEMENT Left 03/23/2018   Procedure: CYSTOSCOPY/URETEROSCOPY/HOLMIUM LASER/STENT Exchange;  Surgeon: Billey Co, MD;  Location: [facility] ORS;  Service: Urology;  Laterality: Left;  left stent exchange  . CYSTOSCOPY/URETEROSCOPY/HOLMIUM LASER/STENT PLACEMENT Left 07/16/2019   Procedure: CYSTOSCOPY/URETEROSCOPY/HOLMIUM LASER/STENT exchange;  Surgeon: [name], MD;  Location: [facility] ORS;  Service: Urology;  Laterality: Left;  . KNEE ARTHROSCOPY Right 1998   Per [facility] New Patient Packet  . LEG SURGERY     broken tib/fib  . TOTAL HIP ARTHROPLASTY Bilateral    2007 & 2012   HPI:  86 y.o. male past medical history of aortic atherosclerosis, bradycardia, CKD stage III, history of a prior  stroke with residual right eye deficits, relatively recent admission for UTI, nephrolithiasis, history of prostate cancer, multiple TIAs in the past according to the wife, presenting to the ER with slurred speech and confusion. Pt received TPA on 5/18.  Pt underwent a MBS on 06/28/19 which reported moderately severe oropharyngeal dysphagia and risk for aspiration with all consistencies.  SLP recommended Dysphagia 3 (soft) solid and nectar-thick liquids at the time.  Wife reported that pt refused to thicken liquids at home.    Assessment / Plan / Recommendation Clinical Impression  Pt was seen for a bedside swallow evaluation in the setting of frequent coughing with PO intake yesterday resulting in respiratory changes.  Pt has a known hx of moderately-severe oropharyngeal dysphagia and he is likely at risk for aspiration with all consistencies.  Pt's wife reported that he consumed some thickened liquids following discharge in March, but that he soon refused them and consumed thin liquids instead.  She reported that he exhibited coughing and choking with thin liquids at home.  Pt consumed trials of thin liquid, nectar-thick liquid, honey-thick liquid, puree, and regular solids during this session.  He exhibited overt s/sx of aspiration with thin liquid and nectar-thick trials evidenced by an immediate, prolonged cough following 2/2 thin liquid trials and following 2/5 nectar-thick liquid trials.  No overt s/sx of aspiration were observed with honey-thick liquid, puree, or regular solids; however, per MBS in March, pt is at an increased risk for post-prandial aspiration secondary to significant pharyngeal residue.  This has likely not improved since MBS was completed, therefore pt remains at high risk for aspiration with all consistencies.  Spoke with pt and wife in depth regarding goals for oral intake and quality of life vs decreased risk of aspiration.  After much discussion, it was determined that pt would begin  a Dysphagia 3 (soft) solid and honey-thick liquid diet with medications administered crushed in puree and allowance for the [employer]  Pt may have small sips of water before or at least 30 minutes after a meal following thorough oral care given full supervision.  Additionally discussed thickening agents that the pt's wife could order for home use as well as instructions on how to use them.  Wife verbalized understanding of all recommendations.  Signs were hung above the pt's bed detailing diet recommendations and Free Water Protocol information.  Spoke with MD and RN regarding all recommendations.    SLP Visit Diagnosis: Dysphagia, oropharyngeal phase (R13.12)    Aspiration Risk  Severe aspiration risk    Diet Recommendation Dysphagia 3 (Mech soft);Honey-thick liquid;Free water protocol after oral care   Liquid Administration via: Cup;Straw Medication Administration: Crushed with puree Supervision: Full supervision/cueing for compensatory strategies;Staff to assist with self feeding Compensations: Minimize environmental distractions;Slow rate;Small sips/bites;Multiple dry swallows after each bite/sip;Effortful swallow Postural Changes: Seated upright at 90 degrees;Remain upright for at least 30 minutes after po intake  Other  Recommendations Oral Care Recommendations: Oral care QID;Oral care prior to ice chip/H20;Staff/trained caregiver to provide oral care Other Recommendations: Prohibited food (jello, ice cream, thin soups);Order thickener from pharmacy;Have oral suction available   Follow up Recommendations Inpatient Rehab;Skilled Nursing facility      Frequency and Duration min 2x/week  2 weeks       Prognosis Prognosis for Safe Diet Advancement: Guarded Barriers to Reach Goals: Severity of deficits;Cognitive deficits      Swallow Study   General HPI: 86 y.o. male past medical history of aortic atherosclerosis, bradycardia, CKD stage III, history of a prior  stroke with residual right eye deficits, relatively recent admission for UTI, nephrolithiasis, history of prostate cancer, multiple TIAs in the past according to the wife, presenting to the ER with slurred speech and confusion. Pt received TPA on 5/18.  Pt underwent a MBS on 06/28/19 which reported moderately severe oropharyngeal dysphagia and risk for aspiration with all consistencies.  SLP recommended Dysphagia 3 (soft) solid and nectar-thick liquids at the time.  Wife reported that pt refused to thicken liquids at home.  Type of Study: Bedside Swallow Evaluation Previous Swallow Assessment: MBS 06/28/19 Diet Prior to this Study: NPO Temperature Spikes Noted: No Respiratory Status: Nasal cannula History of Recent Intubation: No Behavior/Cognition: Alert;Cooperative;Pleasant mood Oral Cavity Assessment: Within Functional Limits Oral Care Completed by SLP: No Oral Cavity - Dentition: Other (Comment)(Adequat dentiton) Vision: Functional for self-feeding Self-Feeding Abilities: Needs assist Patient Positioning: Upright in bed Baseline Vocal Quality: Normal Volitional Cough: Congested;Strong Volitional Swallow: Able to elicit    Oral/Motor/Sensory Function Overall Oral Motor/Sensory Function: Generalized oral weakness   Ice Chips Ice chips: Not tested   Thin Liquid Thin Liquid: Impaired Presentation: Spoon Oral Phase Functional Implications: Oral holding Pharyngeal  Phase Impairments: Cough - Immediate    Nectar Thick Nectar Thick Liquid: Impaired Presentation: Spoon;Straw Oral phase functional implications: Oral holding;Prolonged oral transit Pharyngeal Phase Impairments: Cough - Immediate   Honey Thick Honey Thick Liquid: Impaired Presentation: Straw;Spoon Oral Phase Functional Implications: Oral holding;Prolonged oral transit   Puree Puree: Within functional limits   Solid     Solid: Impaired Presentation: Spoon Oral Phase Impairments: Impaired mastication Oral Phase Functional  Implications: Prolonged oral transit;Impaired mastication;Oral residue     Colin Mulders M.S., CCC-SLP Acute Rehabilitation Services Office: [phone]  [place] 09/08/2019,11:40 AM

## 2019-09-09 ENCOUNTER — Encounter: Payer: Self-pay | Admitting: Urology

## 2019-09-09 DIAGNOSIS — R059 Cough, unspecified: Secondary | ICD-10-CM

## 2019-09-09 DIAGNOSIS — R05 Cough: Secondary | ICD-10-CM

## 2019-09-09 LAB — BASIC METABOLIC PANEL
Anion gap: 6 (ref 5–15)
BUN: 26 mg/dL — ABNORMAL HIGH (ref 8–23)
CO2: 23 mmol/L (ref 22–32)
Calcium: 8.6 mg/dL — ABNORMAL LOW (ref 8.9–10.3)
Chloride: 119 mmol/L — ABNORMAL HIGH (ref 98–111)
Creatinine, Ser: 1.14 mg/dL (ref 0.61–1.24)
GFR calc Af Amer: >60 mL/min (ref >60)
GFR calc non Af Amer: 54 mL/min — ABNORMAL LOW (ref >60)
Glucose, Bld: 118 mg/dL — ABNORMAL HIGH (ref 70–99)
Potassium: 3.3 mmol/L — ABNORMAL LOW (ref 3.5–5.1)
Sodium: 148 mmol/L — ABNORMAL HIGH (ref 135–145)

## 2019-09-09 LAB — GLUCOSE, CAPILLARY
Glucose-Capillary: 114 mg/dL — ABNORMAL HIGH (ref 70–99)
Glucose-Capillary: 100 mg/dL — ABNORMAL HIGH (ref 70–99)

## 2019-09-09 LAB — CBC
HCT: 32.4 % — ABNORMAL LOW (ref 39.0–52.0)
Hemoglobin: 10.4 g/dL — ABNORMAL LOW (ref 13.0–17.0)
MCH: 31.3 pg (ref 26.0–34.0)
MCHC: 32.1 g/dL (ref 30.0–36.0)
MCV: 97.6 fL (ref 80.0–100.0)
Platelets: 155 10*3/uL (ref 150–400)
RBC: 3.32 MIL/uL — ABNORMAL LOW (ref 4.22–5.81)
RDW: 14.4 % (ref 11.5–15.5)
WBC: 5.6 10*3/uL (ref 4.0–10.5)
nRBC: 0 % (ref 0.0–0.2)

## 2019-09-09 LAB — MAGNESIUM: Magnesium: 1.7 mg/dL (ref 1.7–2.4)

## 2019-09-09 MED ORDER — RESOURCE THICKENUP CLEAR PO POWD
ORAL | Status: DC | PRN
  Filled 2019-09-09: qty 125

## 2019-09-09 MED ORDER — SODIUM CHLORIDE 0.9 % IV SOLN
3.0000 g | Freq: Three times a day (TID) | INTRAVENOUS | Status: DC
  Administered 2019-09-09 – 2019-09-11 (×6): 3 g via INTRAVENOUS
  Filled 2019-09-09 (×6): qty 8
  Filled 2019-09-09 (×2): qty 3

## 2019-09-09 MED ORDER — SODIUM CHLORIDE 0.45 % IV SOLN: INTRAVENOUS | Status: DC

## 2019-09-09 NOTE — Progress Notes (Signed)
Physical Therapy Treatment Patient Details Name: Robert [name] MRN: [id number] DOB: [birth year] Today's Date: 09/09/2019    History of Present Illness 86 y.o. male past medical history of aortic atherosclerosis, bradycardia, CKD stage III, history of a prior stroke with residual right eye deficits, relatively recent admission for UTI, nephrolithiasis, history of prostate cancer, multiple TIAs in the past according to the wife, presenting to the ER with slurred speech and confusion. Pt received TPA on 5/18.     PT Comments    Pt was seen for mobility to stand and transfer with RW to a chair, and upon rechecking O2 sats with another standing bout, note his sats dropped again below 88%.   First standing bout dropped to 84%, second bout was 87%.  Pt is SOB and with 6L O2 may continue to be unable to tolerate standing and gait toward completion of rehab.  Will recommend SNF to avoid home until ready, as he is there with his wife.  Follow acutely to work on endurance and standing stability, balance and LE strength.  Follow Up Recommendations  SNF     Equipment Recommendations  None recommended by PT    Recommendations for Other Services       Precautions / Restrictions Precautions Precautions: Fall Precaution Comments: impulsive; watch O2 Restrictions Weight Bearing Restrictions: No    Mobility  Bed Mobility Overal bed mobility: Needs Assistance Bed Mobility: Supine to Sit     Supine to sit: Mod assist     General bed mobility comments: assisted to sit up and get to chair  Transfers Overall transfer level: Needs assistance Equipment used: Rolling walker (2 wheeled) Transfers: Sit to/from Stand           General transfer comment: mod assist of two to stand lower surfaces and max of one with one person help  Ambulation/Gait Ambulation/Gait assistance: Min assist;+2 physical assistance;+2 safety/equipment Gait Distance (Feet): 4 Feet Assistive device: Rolling walker (2  wheeled);1 person hand held assist Gait Pattern/deviations: Step-to pattern;Wide base of support;Trunk flexed Gait velocity: reduced Gait velocity interpretation: <1.31 ft/sec, indicative of household ambulator General Gait Details: short trip stepping to get to chair   Stairs             Wheelchair Mobility    Modified Rankin (Stroke Patients Only)       Balance Overall balance assessment: Needs assistance Sitting-balance support: Feet supported;Single extremity supported Sitting balance-Leahy Scale: Fair Sitting balance - Comments: without UE needs min assist to balance on side of bed   Standing balance support: Bilateral upper extremity supported;During functional activity Standing balance-Leahy Scale: Poor                              Cognition Arousal/Alertness: Awake/alert Behavior During Therapy: WFL for tasks assessed/performed Overall Cognitive Status: Impaired/Different from baseline Area of Impairment: Problem solving;Awareness;Safety/judgement;Following commands;Attention                   Current Attention Level: Selective Memory: Decreased recall of precautions;Decreased short-term memory Following Commands: Follows one step commands inconsistently;Follows one step commands with increased time Safety/Judgement: Decreased awareness of safety;Decreased awareness of deficits Awareness: Intellectual Problem Solving: Slow processing;Difficulty sequencing;Requires verbal cues;Requires tactile cues General Comments: requires dense cues to sequence and manage safety to stand      Exercises General Exercises - Lower Extremity Ankle Circles/Pumps: AROM;5 reps Long Arc Quad: Strengthening;10 reps Heel Slides: Strengthening;10 reps    General  Comments General comments (skin integrity, edema, etc.): requires 6L O2 for mobility and with standing could not keep sats 88% or greater      Pertinent Vitals/Pain Pain Assessment: No/denies pain     Home Living                      Prior Function            PT Goals (current goals can now be found in the care plan section) Acute Rehab PT Goals Patient Stated Goal: to get up and walk with walker Progress towards PT goals: Progressing toward goals    Frequency    Min 2X/week      PT Plan Current plan remains appropriate    Co-evaluation              AM-PAC PT "6 Clicks" Mobility   Outcome Measure  Help needed turning from your back to your side while in a flat bed without using bedrails?: A Lot Help needed moving from lying on your back to sitting on the side of a flat bed without using bedrails?: A Lot Help needed moving to and from a bed to a chair (including a wheelchair)?: A Lot Help needed standing up from a chair using your arms (e.g., wheelchair or bedside chair)?: A Lot Help needed to walk in hospital room?: A Lot Help needed climbing 3-5 steps with a railing? : Total 6 Click Score: 11    End of Session Equipment Utilized During Treatment: Gait belt;Oxygen Activity Tolerance: Patient limited by fatigue;Treatment limited secondary to medical complications (Comment) Patient left: in chair;with call bell/phone within reach;with chair alarm set;with family/visitor present Nurse Communication: Mobility status;Precautions;Other (comment)(sats with standing) PT Visit Diagnosis: Unsteadiness on feet (R26.81);History of falling (Z91.81);Muscle weakness (generalized) (M62.81);Difficulty in walking, not elsewhere classified (R26.2);Other abnormalities of gait and mobility (R26.89)     Time: [id number] PT Time Calculation (min) (ACUTE ONLY): 34 min  Charges:  $Therapeutic Activity: 23-37 mins                 Ramond Dial 09/09/2019, 8:49 PM  Mee Hives, PT MS Acute Rehab Dept. Number: [place]

## 2019-09-09 NOTE — Progress Notes (Signed)
Paged by RN that bladder scan was 361 mL. This is her third night with patient and patient has been voiding spontaneously, no straight cath yet. Repeat bladder scan as scheduled and if > 400 mL or if patient symptomatic, can consider straight cath.  Barrington Ellison, MD [facility]

## 2019-09-09 NOTE — Progress Notes (Signed)
Occupational Therapy Treatment Patient Details Name: Robert [name] MRN: [id number] DOB: [birth year] Today's Date: 09/09/2019    History of present illness 86 y.o. male past medical history of aortic atherosclerosis, bradycardia, CKD stage III, history of a prior stroke with residual right eye deficits, relatively recent admission for UTI, nephrolithiasis, history of prostate cancer, multiple TIAs in the past according to the wife, presenting to the ER with slurred speech and confusion. Pt received TPA on 5/18.    OT comments  Pt making steady progress towards OT goals this session. Pt handed off from PT session. Pt continues to present with dyspnea from exertion on 6L, decreased activity tolerance, and generalized weakness impacting pts ability to engage in BADLs. Overall, pt required MOD A +2 for sit <>stand with pt desaturating to 86% on 6L needing seated reset break for O2 to rebound to > 90%. Pt completed seated UB ADLs with MIN- supervision from recliner. DC plan remains appropriate, will follow acutely per POC.    Follow Up Recommendations  SNF;Supervision/Assistance - 24 hour    Equipment Recommendations  3 in 1 bedside commode;Other (comment)(TBA)    Recommendations for Other Services      Precautions / Restrictions Precautions Precautions: Fall Precaution Comments: impulsive; watch O2 Restrictions Weight Bearing Restrictions: No       Mobility Bed Mobility               General bed mobility comments: oob in chair upon OTA arrival  Transfers Overall transfer level: Needs assistance Equipment used: Rolling walker (2 wheeled) Transfers: Sit to/from Stand Sit to Stand: +2 physical assistance;Mod assist         General transfer comment: MOD A +2 for sit<>stand from recliner; pt limited by dyspnea with exertion with O2 dropping to 86% on 6L    Balance Overall balance assessment: Needs assistance Sitting-balance support: Bilateral upper extremity supported;Feet  supported Sitting balance-Leahy Scale: Fair     Standing balance support: Bilateral upper extremity supported Standing balance-Leahy Scale: Poor Standing balance comment: reliant on BUE support and external assist                           ADL either performed or assessed with clinical judgement   ADL Overall ADL's : Needs assistance/impaired     Grooming: Oral care;Wash/dry face;Sitting;Minimal assistance;Supervision/safety;Set up Grooming Details (indicate cue type and reason): sup- set- up to wash face; MIN A for oral care for cleanliness                 Toilet Transfer: Moderate assistance;+2 for safety/equipment;With caregiver independent assisting;RW Toilet Transfer Details (indicate cue type and reason): sit<>stand only from recliner d/t pt desat to 86% on 6L         Functional mobility during ADLs: Maximal assistance;+2 for safety/equipment;Cueing for safety;Cueing for sequencing General ADL Comments: pt with cognitive deficits at baseline needing MOD A +2 for sit<>stand, limited by O2 saturations this date on 6L     Vision       Perception     Praxis      Cognition Arousal/Alertness: Awake/alert Behavior During Therapy: WFL for tasks assessed/performed Overall Cognitive Status: Impaired/Different from baseline Area of Impairment: Safety/judgement;Awareness;Problem solving                         Safety/Judgement: Decreased awareness of safety;Decreased awareness of deficits Awareness: Intellectual Problem Solving: Slow processing;Difficulty sequencing;Requires verbal cues;Requires tactile cues General  Comments: overall slow to process but following commands; pt with decreased awareness into deficits needing cues for safety        Exercises     Shoulder Instructions       General Comments pt on 6L at start of session with O2 dropping to 86% during sit<>stand needing seated rest break to rebound to >90%. Pt condom cath came off  during session with NT aware    Pertinent Vitals/ Pain       Pain Assessment: No/denies pain  Home Living                                          Prior Functioning/Environment              Frequency  Min 2X/week        Progress Toward Goals  OT Goals(current goals can now be found in the care plan section)  Progress towards OT goals: Progressing toward goals  Acute Rehab OT Goals Patient Stated Goal: to get up and walk with walker OT Goal Formulation: With family Time For Goal Achievement: 09/18/19 Potential to Achieve Goals: [place] Discharge plan remains appropriate    Co-evaluation                 AM-PAC OT "6 Clicks" Daily Activity     Outcome Measure   Help from another person eating meals?: A Little Help from another person taking care of personal grooming?: A Little Help from another person toileting, which includes using toliet, bedpan, or urinal?: A Lot Help from another person bathing (including washing, rinsing, drying)?: A Lot Help from another person to put on and taking off regular upper body clothing?: A Lot Help from another person to put on and taking off regular lower body clothing?: A Lot 6 Click Score: 14    End of Session Equipment Utilized During Treatment: Gait belt;Rolling walker  OT Visit Diagnosis: Unsteadiness on feet (R26.81);Other abnormalities of gait and mobility (R26.89);Muscle weakness (generalized) (M62.81);History of falling (Z91.81);Other symptoms and signs involving cognitive function;Pain   Activity Tolerance Patient tolerated treatment well   Patient Left in chair;with call bell/phone within reach;with chair alarm set;with family/visitor present   Nurse Communication Mobility status;Other (comment)(needs new condom cath)        Time: [id number] OT Time Calculation (min): 25 min  Charges: OT General Charges $OT Visit: 1 Visit OT Treatments $Self Care/Home Management : 23-37  mins  Lanier Clam., COTA/L Acute Rehabilitation Services [id number]    Ihor Gully 09/09/2019, 1:04 PM

## 2019-09-09 NOTE — Progress Notes (Signed)
  Speech Language Pathology Treatment: Dysphagia  Patient Details Name: Robert [name] MRN: [id number] DOB: 04/16/1921 Today's Date: 09/09/2019 Time: [id number] SLP Time Calculation (min) (ACUTE ONLY): 30 min  Assessment / Plan / Recommendation Clinical Impression  Pt continues to demonstrate significant signs of aspiration with meal, despite honey thick liquids and soft solids. He particularly struggled with the pears on his tray. Took the opportunity to discuss precautions with wife, thickening strategies. Pt very happy to eat and drink despite difficulty. He also followed simple cueing well. His interactions with wife from a cognitive linguistic standpoint were appropriate No f/u needed for speech, will f/u for dysphagia education as needed.   HPI        SLP Plan  Continue with current plan of care       Recommendations  Diet recommendations: Dysphagia 3 (mechanical soft);Honey-thick liquid Liquids provided via: Cup Medication Administration: Crushed with puree Supervision: Patient able to self feed                Oral Care Recommendations: Oral care QID;Oral care prior to ice chip/H20;Staff/trained caregiver to provide oral care Follow up Recommendations: Inpatient Rehab;Skilled Nursing facility SLP Visit Diagnosis: Dysphagia, oropharyngeal phase (R13.12) Plan: Continue with current plan of care       GO                Kasey Ewings, Katherene Ponto 09/09/2019, 2:14 PM

## 2019-09-09 NOTE — Progress Notes (Signed)
MD notified of bladderscanned @361ml . Awaiting response.

## 2019-09-09 NOTE — Progress Notes (Signed)
PROGRESS NOTE    Robert [name]  [id number] DOB: 10-02-1920 DOA: 09/03/2019 PCP: Lauree Chandler, NP   Brief Narrative: Robert [name] is a 86 y.o. male with medical history significant of HLD, multiple TIAs, CVA with residual right-sided deficits, nephrolithiasis, dementia, and CKD stage III who presented on 5/18 with confusion and slurred speech noted around 11:30 AM.  Patient had previously been normal at 10:30 AM.  Code stroke was activated in the ED.  The patient was within the window for TPA and it was given.  Patient was noted to have improvement in and his symptoms following TPA.  However, yesterday patient was noted to have dropping O2 saturation for which he was temporarily on nonrebreather.  Chest x-rays was concerning for multifocal pneumonia.  Wife had reported that the patient had been intermittently coughing with his breakfast as well as at home.  Patient was made n.p.o. at that point.  Labs have been significant for creatinine elevated up to 1.75, BUN 31, and WBC elevated to 11.8.  Speech therapy was able to formally evaluate today and recommended dysphagia 3 mechanically soft diet with honey thick liquids meals.  Patient was placed on D5-0.9%NS at 75 mL/h.  Assessment & Plan:   Active Problems:   AKI (acute kidney injury) ([place])   Acute ischemic stroke ([place])   Aspiration pneumonia ([place])   Dysphagia  #1 acute hypoxic respiratory failure secondary to aspiration-he was started on Unasyn yesterday 09/08/2019 with improvement in his white count.  He is still on 5 L of oxygen to maintain his saturation.  Seen by speech therapy recommended dysphagia 3 diet.  Continue aspiration precautions. Continue Unasyn. Titrate oxygen to keep her saturation above 88%.  #2 hyper natremia change fluids to half-normal saline and recheck labs in a.m.  #3 acute stroke status post TPA followed by neurology.  #4 history of essential hypertension on amlodipine  #5 history of hyperlipidemia on  Lipitor and fish oil.  #6 history of dementia with cognitive decline.  #7 BPH continue Flomax.  #8 hypokalemia potassium 3.3 replete.  Check magnesium.   Pressure Injury 06/24/19 Sacrum Posterior Stage 1 -  Intact skin with non-blanchable redness of a localized area usually over a bony prominence. (Active)  06/24/19 0934  Location: Sacrum  Location Orientation: Posterior  Staging: Stage 1 -  Intact skin with non-blanchable redness of a localized area usually over a bony prominence.  Wound Description (Comments):   Present on Admission:     Nutrition Problem: Inadequate oral intake Etiology: poor appetite   Signs/Symptoms: per patient/family report  Interventions: Nepro shake, Magic cup  Estimated body mass index is 25.95 kg/m as calculated from the following:   Height as of this encounter: 5\' 11"  (1.803 m).   Weight as of this encounter: 84.4 kg.  DVT prophylaxis:  Code Status: DO NOT RESUSCITATE Family Communication: No one at bedside Disposition Plan:  Status is: Inpatient  Dispo: The patient is from:home              Anticipated d/c is to:snf              Anticipated d/c date is unknown              Patient currently is not medically stable to d/c.patient is aspirating and hypoxic needs iv antibiotics and still on 5 lit oxygen   Consultants: Neurology Procedures status post TPA Antimicrobials: Unasyn Subjective: He is resting in bed he is awake asking why his wife has  not come yet On 5 L of oxygen  Objective: Vitals:   09/09/19 0039 09/09/19 0355 09/09/19 0500 09/09/19 0755  BP: (!) 142/79 140/66  (!) 165/79  Pulse: 81 84    Resp: (!) 23 19  18   Temp: 97.8 F (36.6 C) 97.8 F (36.6 C)  98.2 F (36.8 C)  TempSrc: Oral Oral  Axillary  SpO2: 96% 95%  96%  Weight:   84.4 kg   Height:        Intake/Output Summary (Last 24 hours) at 09/09/2019 1140 Last data filed at 09/09/2019 1016 Gross per 24 hour  Intake 120 ml  Output 1550 ml  Net -1430 ml    Filed Weights   09/03/19 1400 09/09/19 0500  Weight: 85.2 kg 84.4 kg    Examination:  General exam: Appears calm and comfortable  Respiratory system: Scattered rhonchi and crackles at the bases to auscultation. Respiratory effort normal. Cardiovascular system: S1 & S2 heard, RRR. No JVD, murmurs, rubs, gallops or clicks. No pedal edema. Gastrointestinal system: Abdomen is nondistended, soft and nontender. No organomegaly or masses felt. Normal bowel sounds heard. Central nervous system: Alert and oriented. No focal neurological deficits. Extremities: Symmetric 5 x 5 power. Skin: No rashes, lesions or ulcers Psychiatry: Judgement and insight appear normal. Mood & affect appropriate.     Data Reviewed: I have personally reviewed following labs and imaging studies  CBC: Recent Labs  Lab 09/03/19 1410 09/03/19 1414 09/05/19 0144 09/06/19 0448 09/07/19 0339 09/08/19 0431 09/09/19 0444  WBC 8.0   < > 11.8* 11.7* 10.9* 5.7 5.6  NEUTROABS 5.6  --   --   --   --   --   --   HGB 12.3*   < > 11.8* 12.7* 11.7* 11.6* 10.4*  HCT 38.5*   < > 35.8* 38.5* 35.9* 35.1* 32.4*  MCV 101.6*   < > 95.5 96.3 96.5 98.3 97.6  PLT 167   < > 169 167 160 154 155   < > = values in this interval not displayed.   Basic Metabolic Panel: Recent Labs  Lab 09/05/19 0144 09/06/19 0448 09/07/19 0339 09/08/19 0431 09/09/19 0444  NA 139 141 139 145 148*  K 4.3 4.2 3.7 3.9 3.3*  CL 108 110 109 116* 119*  CO2 21* 22 20* 23 23  GLUCOSE 99 84 133* 121* 118*  BUN 17 19 31* 36* 26*  CREATININE 1.22 1.39* 1.75* 1.69* 1.14  CALCIUM 9.3 9.4 9.0 8.9 8.6*   GFR: Estimated Creatinine Clearance: 40.4 mL/min (by C-G formula based on SCr of 1.14 mg/dL). Liver Function Tests: Recent Labs  Lab 09/03/19 1410  AST 30  ALT 12  ALKPHOS 53  BILITOT 0.7  PROT 6.0*  ALBUMIN 3.5   No results for input(s): LIPASE, AMYLASE in the last 168 hours. No results for input(s): AMMONIA in the last 168  hours. Coagulation Profile: Recent Labs  Lab 09/03/19 1410  INR 1.1   Cardiac Enzymes: No results for input(s): CKTOTAL, CKMB, CKMBINDEX, TROPONINI in the last 168 hours. BNP (last 3 results) No results for input(s): PROBNP in the last 8760 hours. HbA1C: No results for input(s): HGBA1C in the last 72 hours. CBG: Recent Labs  Lab 09/08/19 0611 09/08/19 1206 09/08/19 1548 09/08/19 2130 09/09/19 0610  GLUCAP 100* 125* 129* 120* 114*   Lipid Profile: No results for input(s): CHOL, HDL, LDLCALC, TRIG, CHOLHDL, LDLDIRECT in the last 72 hours. Thyroid Function Tests: No results for input(s): TSH, T4TOTAL, FREET4, T3FREE, THYROIDAB  in the last 72 hours. Anemia Panel: No results for input(s): VITAMINB12, FOLATE, FERRITIN, TIBC, IRON, RETICCTPCT in the last 72 hours. Sepsis Labs: Recent Labs  Lab 09/08/19 0431  PROCALCITON 0.78    Recent Results (from the past 240 hour(s))  SARS Coronavirus 2 by RT PCR (hospital order, performed in [facility] hospital lab) Nasopharyngeal Nasopharyngeal Swab     Status: None   Collection Time: 09/03/19  4:30 PM   Specimen: Nasopharyngeal Swab  Result Value Ref Range Status   SARS Coronavirus 2 NEGATIVE NEGATIVE Final    Comment: (NOTE) SARS-CoV-2 target nucleic acids are NOT DETECTED. The SARS-CoV-2 RNA is generally detectable in upper and lower respiratory specimens during the acute phase of infection. The lowest concentration of SARS-CoV-2 viral copies this assay can detect is 250 copies / mL. A negative result does not preclude SARS-CoV-2 infection and should not be used as the sole basis for treatment or other patient management decisions.  A negative result may occur with improper specimen collection / handling, submission of specimen other than nasopharyngeal swab, presence of viral mutation(s) within the areas targeted by this assay, and inadequate number of viral copies (<250 copies / mL). A negative result must be combined with  clinical observations, patient history, and epidemiological information. Fact Sheet for Patients:   StrictlyIdeas.no Fact Sheet for Healthcare Providers: BankingDealers.co.za This test is not yet approved or cleared  by the [place] FDA and has been authorized for detection and/or diagnosis of SARS-CoV-2 by FDA under an Emergency Use Authorization (EUA).  This EUA will remain in effect (meaning this test can be used) for the duration of the COVID-19 declaration under Section 564(b)(1) of the Act, 21 U.S.C. section 360bbb-3(b)(1), unless the authorization is terminated or revoked sooner. Performed at [facility], [place] [address] [place], [place] [address]   MRSA PCR Screening     Status: None   Collection Time: 09/03/19  4:30 PM   Specimen: Nasopharyngeal Swab  Result Value Ref Range Status   MRSA by PCR NEGATIVE NEGATIVE Final    Comment:        The GeneXpert MRSA Assay (FDA approved for NASAL specimens only), is one component of a comprehensive MRSA colonization surveillance program. It is not intended to diagnose MRSA infection nor to guide or monitor treatment for MRSA infections. Performed at [facility], [place] [address] [place], [place] [address]   SARS CORONAVIRUS 2 (TAT 6-24 HRS) Nasopharyngeal Nasopharyngeal Swab     Status: None   Collection Time: 09/06/19  4:47 PM   Specimen: Nasopharyngeal Swab  Result Value Ref Range Status   SARS Coronavirus 2 NEGATIVE NEGATIVE Final    Comment: (NOTE) SARS-CoV-2 target nucleic acids are NOT DETECTED. The SARS-CoV-2 RNA is generally detectable in upper and lower respiratory specimens during the acute phase of infection. Negative results do not preclude SARS-CoV-2 infection, do not rule out co-infections with other pathogens, and should not be used as the sole basis for treatment or other patient management decisions. Negative results must be combined with  clinical observations, patient history, and epidemiological information. The expected result is Negative. Fact Sheet for Patients: SugarRoll.be Fact Sheet for Healthcare Providers: https://www.woods-.com/ This test is not yet approved or cleared by the [place] FDA and  has been authorized for detection and/or diagnosis of SARS-CoV-2 by FDA under an Emergency Use Authorization (EUA). This EUA will remain  in effect (meaning this test can be used) for the duration of the COVID-19  declaration under Section 56 4(b)(1) of the Act, 21 U.S.C. section 360bbb-3(b)(1), unless the authorization is terminated or revoked sooner. Performed at [facility], [place] [address], [place], [place] [address]          Radiology Studies: DG CHEST PORT 1 VIEW  Result Date: 09/08/2019 CLINICAL DATA:  Aspiration EXAM: PORTABLE CHEST 1 VIEW COMPARISON:  09/07/2019 FINDINGS: Cardiac shadow is stable. Aortic calcifications are again seen. Patchy infiltrates are again noted bilaterally worse on the right than the left but stable. No new focal abnormality is noted. IMPRESSION: Stable airspace opacities bilaterally as described. Electronically Signed   By: Inez Catalina M.D.   On: 09/08/2019 08:29        Scheduled Meds: . amLODipine  10 mg Oral Daily  . aspirin  81 mg Oral Daily  . atorvastatin  10 mg Oral Daily  . Chlorhexidine Gluconate Cloth  6 each Topical Daily  . feeding supplement (NEPRO CARB STEADY)  237 mL Oral BID BM  . heparin injection (subcutaneous)  5,000 Units Subcutaneous Q8H  . multivitamin with minerals  1 tablet Oral Daily  . pantoprazole  40 mg Oral Daily  . QUEtiapine  25 mg Oral QHS  . tamsulosin  0.4 mg Oral QHS   Continuous Infusions: . ampicillin-sulbactam (UNASYN) IV 3 g (09/09/19 1033)  . dextrose 5 % and 0.9% NaCl 100 mL/hr at 09/08/19 1759     LOS: 6 days    Georgette Shell, MD 09/09/2019, 11:40 AM

## 2019-09-09 NOTE — Progress Notes (Signed)
STROKE TEAM PROGRESS NOTE   INTERVAL HISTORY  His wife is  at the bedside.  Patient awake alert, oriented, still has frequent coughing.  On 6 lits nasal canula maintaining O2 sats at 94% but desats into when he sits up.  Appreciate medical hospitalist team helping with managing resp failure and aspiration pneumonia.  On Unasyn.  Afebrile.  White count normal.  Slightly hyponatremic and hypokalemic  Vitals:   09/09/19 0355 09/09/19 0500 09/09/19 0755 09/09/19 1149  BP: 140/66  (!) 165/79 135/68  Pulse: 84   76  Resp: 19  18 20   Temp: 97.8 F (36.6 C)  98.2 F (36.8 C) 97.6 F (36.4 C)  TempSrc: Oral  Axillary Oral  SpO2: 95%  96% 96%  Weight:  84.4 kg    Height:        CBC:  Recent Labs  Lab 09/03/19 1410 09/03/19 1414 09/08/19 0431 09/09/19 0444  WBC 8.0   < > 5.7 5.6  NEUTROABS 5.6  --   --   --   HGB 12.3*   < > 11.6* 10.4*  HCT 38.5*   < > 35.1* 32.4*  MCV 101.6*   < > 98.3 97.6  PLT 167   < > 154 155   < > = values in this interval not displayed.    Basic Metabolic Panel:  Recent Labs  Lab 09/08/19 0431 09/09/19 0444  NA 145 148*  K 3.9 3.3*  CL 116* 119*  CO2 23 23  GLUCOSE 121* 118*  BUN 36* 26*  CREATININE 1.69* 1.14  CALCIUM 8.9 8.6*   Lipid Panel:     Component Value Date/Time   CHOL 134 09/04/2019 0353   TRIG 60 09/04/2019 0353   HDL 57 09/04/2019 0353   CHOLHDL 2.4 09/04/2019 0353   VLDL 12 09/04/2019 0353   LDLCALC 65 09/04/2019 0353   HgbA1c:  Lab Results  Component Value Date   HGBA1C 5.2 09/04/2019   Urine Drug Screen:     Component Value Date/Time   LABOPIA NONE DETECTED 09/03/2019 1636   COCAINSCRNUR NONE DETECTED 09/03/2019 1636   LABBENZ NONE DETECTED 09/03/2019 1636   AMPHETMU NONE DETECTED 09/03/2019 1636   THCU NONE DETECTED 09/03/2019 1636   LABBARB NONE DETECTED 09/03/2019 1636    Alcohol Level     Component Value Date/Time   ETH <10 09/03/2019 1410    IMAGING past 24 hours  No results found.  PHYSICAL  EXAM  Temp:  [97.6 F (36.4 C)-98.7 F (37.1 C)] 97.6 F (36.4 C) (05/24 1149) Pulse Rate:  [63-90] 76 (05/24 1149) Resp:  [16-28] 20 (05/24 1149) BP: (125-165)/(57-85) 135/68 (05/24 1149) SpO2:  [89 %-96 %] 96 % (05/24 1149) Weight:  [84.4 kg] 84.4 kg (05/24 0500)  General - Well nourished, well developed, less lethargic but still frequent coughing.  Ophthalmologic - fundi not visualized due to noncooperation.  Cardiovascular - Regular rhythm and rate.  Mental Status -  Level of arousal and orientation to place, time and person were intac. Language including expression, naming, repetition, comprehension was assessed and found intact. Moderate  dysarthria  Cranial Nerves II - XII - II - Visual field intact OU.  III, IV, VI - Extraocular movements intact. V - Facial sensation intact bilaterally. VII - Facial movement intact bilaterally. VIII - Hearing & vestibular intact bilaterally. X - Palate elevates symmetrically. XI - Chin turning & shoulder shrug intact bilaterally. XII - Tongue protrusion intact.  Motor Strength - The patient's strength was  symmetrical in all extremities and pronator drift was absent.  Bulk was normal and fasciculations were absent.   Motor Tone - Muscle tone was assessed at the neck and appendages and was normal.  Reflexes - The patient's reflexes were symmetrical in all extremities and he had no pathological reflexes.  Sensory - Light touch, temperature/pinprick were assessed and were symmetrical.    Coordination - The patient had normal movements in the hands with no ataxia or dysmetria,   Bilateral mild action tremor.  Gait and Station - deferred.   ASSESSMENT/PLAN [name] is a 86 y.o. male with history of stroke, TIA, HTN, HLD, Aortic atherosclerosis, ICA stenosis presenting with slurred speech, confusion, and aphasia. Received IV tPA 09/03/2019 at 1434.   Stroke-like episode s/p tPA - MRI neg - similar episodes 2012, 2013,  2016 and 2019, MRI all negative, diagnosed with TIAs  Code Stroke CT head No acute abnormality. Small vessel disease. Atrophy. Old R parietal infarct. ASPECTS 10.     CTA head & neck R ICA 35% stenosis. Mild atherosclerosis L ICA bifurcation.   CUS unremarkable  MRI  No acute infarct. Small vessel disease. Old R parietal infarct.  CT head repeat no ICH. Small vessel disease. Atrophy.   2D Echo EF 50-55%  EEG no sz  LDL 65  HgbA1c 5.2  Lovenox 40 mg sq daily for VTE prophylaxis  aspirin 81 mg daily prior to admission, now on ASA 81mg     Therapy recommendations:  SNF  Disposition:  SNF at [facility]. COVID testing neg  Aspiration penumonia Respiratory distress  5/21 Overnight and 5/22 am coughing with liquid during breakfast  O2 sat dropping, fluctuating between 86-93%  Robert [name] -> NRB -> VM -> NRB -> VM -> Robert [name]  Kept NPO -> now passed swallow on dys 3 honey thick  CXR 5/22 concerning for multifocal pneumonia   CXR 5/23 Stable airspace opacities bilaterally as described.  IM consult appreciated  Leukocytosis WBC 8.0->11.8->11.7->5.7 (afebrile)  IV Unasyn started 5/23>> for 3-5 days  AKI on CKD  CKD stage III, Cre 1.45-1.40->1.22->1.39->1.75->1.69  FeNA = 0.03% suggestive of prerenal cause  on IVF @ 75->100cc / hr per IM  Continue monitor   IM assistance appreciated  Cognitive decline Delirium Sundowning  For the last one year, wife and daughter noted intermittent slurry speech  Patient had delirium and hallucination 5/18 overnight  5/29 patient also had sundowning and abdomen -> Posey belt restraint -> Received 25mg  and addition seroquel 75  5/20 drowsy sleepy -> 5/21 back to baseline -> 5/22 confusion at night -> 5/23 orientated  On Seroquel 25 hs -> haldol PRN  Hx stroke and TIA  06/2017 - TIA. aphasia and disorientation. Resolved. OP workup w/ B ICA 50% stenosis. AV stenosis. HTN. Continued asa (no plavix d/t falls and concern for  bleeding).  12/2014 - Hx stroke w/ residual vision deficits OD. On aspirin. Previously on plavix.   2012, 2013, 2016 - recurrent TIAs with similar presentation as 2019   Hypertension  BP as high as 195/90  Home meds:  None listed  Treated w/ cleviprex, now off  BP goal < 180/105  On amlodipine 10 . Long-term BP goal normotensive  Hyperlipidemia  Home meds:  lipitor 10 and fish oil  LDL 65, at goal < 70  Resume Lipitor 10  Continue statin and fish oil at discharge  Dysphagia   Pt has baseline dysphagia but on soft diet at home  Kept NPO after aspiration  Speech on board today  Balanced quality of life and aspiration risk - now recommend dys 3 with honey thick  On IVF also  Other Stroke Risk Factors  Advanced age  Former Cigarette smoker, quit 11 yrs ago  ETOH use, alcohol level <10, advised to drink no more than 2 drink(s) a day  Aortic atherosclerosis.  Other Active Problems  Prostate cancer  Hospital day # 6  Patient condition continue to be in respiratory failure requiring continuous oxygen but yet continues with coughing with sign of aspiration pneumonia, continues to have elevated Cre and IVF, desating and on VM, delirium needed haldol IV and now transferred to medical hospitalist team from medical management and remains on  Abx. I discussed with Dr. Zigmund Daniel. I spent  25 minutes in total face-to-face time with the patient, more than 50% of which was spent in counseling and coordination of care, reviewing test results, images and medication, and discussing the diagnosis, treatment plan and potential prognosis. This patient's care requiresreview of multiple databases, neurological assessment, discussion with family, other specialists and medical decision making of high complexity.  I had long discussion with wife at bedside, updated pt current condition, treatment plan and potential prognosis, and answered all the questions. Wife expressed understanding  and appreciation.   Antony Contras, MD Stroke Neurology 09/09/2019 2:29 PM   To contact Stroke Continuity provider, please refer to [email or web address] After hours, contact General Neurology

## 2019-09-10 ENCOUNTER — Inpatient Hospital Stay (HOSPITAL_COMMUNITY): Payer: Medicare PPO

## 2019-09-10 LAB — CBC
HCT: 33 % — ABNORMAL LOW (ref 39.0–52.0)
Hemoglobin: 10.6 g/dL — ABNORMAL LOW (ref 13.0–17.0)
MCH: 31.3 pg (ref 26.0–34.0)
MCHC: 32.1 g/dL (ref 30.0–36.0)
MCV: 97.3 fL (ref 80.0–100.0)
Platelets: 170 10*3/uL (ref 150–400)
RBC: 3.39 MIL/uL — ABNORMAL LOW (ref 4.22–5.81)
RDW: 14.2 % (ref 11.5–15.5)
WBC: 7.5 10*3/uL (ref 4.0–10.5)
nRBC: 0 % (ref 0.0–0.2)

## 2019-09-10 LAB — COMPREHENSIVE METABOLIC PANEL
ALT: 17 U/L (ref 0–44)
AST: 28 U/L (ref 15–41)
Albumin: 2.5 g/dL — ABNORMAL LOW (ref 3.5–5.0)
Alkaline Phosphatase: 48 U/L (ref 38–126)
Anion gap: 11 (ref 5–15)
BUN: 20 mg/dL (ref 8–23)
CO2: 23 mmol/L (ref 22–32)
Calcium: 8.9 mg/dL (ref 8.9–10.3)
Chloride: 114 mmol/L — ABNORMAL HIGH (ref 98–111)
Creatinine, Ser: 1.09 mg/dL (ref 0.61–1.24)
GFR calc Af Amer: >60 mL/min (ref >60)
GFR calc non Af Amer: 57 mL/min — ABNORMAL LOW (ref >60)
Glucose, Bld: 93 mg/dL (ref 70–99)
Potassium: 3.6 mmol/L (ref 3.5–5.1)
Sodium: 148 mmol/L — ABNORMAL HIGH (ref 135–145)
Total Bilirubin: 1.2 mg/dL (ref 0.3–1.2)
Total Protein: 5.3 g/dL — ABNORMAL LOW (ref 6.5–8.1)

## 2019-09-10 LAB — SARS CORONAVIRUS 2 (TAT 6-24 HRS): SARS Coronavirus 2: NEGATIVE

## 2019-09-10 MED ORDER — DEXTROSE-NACL 5-0.45 % IV SOLN: INTRAVENOUS | Status: DC

## 2019-09-10 NOTE — Progress Notes (Signed)
PROGRESS NOTE    Robert [name]  [id number] DOB: 12-03-1920 DOA: 09/03/2019 PCP: Lauree Chandler, NP    Brief Narrative:Robert C Kirschis a 86 y.o.malewith medical history significant ofHLD, multiple TIAs, CVA with residual right-sided deficits, nephrolithiasis, dementia, and CKD stage III who presented on 5/18 with confusion and slurred speech noted around 11:30 AM. Patient had previously been normal at 10:30 AM. Code stroke was activated in the ED. The patient was within the window for TPA and it was given. Patient was noted to have improvement in and his symptoms following TPA. However, yesterday patient was noted to have dropping O2 saturation for which he was temporarily on nonrebreather. Chest x-rays was concerning for multifocal pneumonia. Wife had reported that the patient had been intermittently coughing with his breakfast as well as at home. Patient was made n.p.o. atthat point. Labs have been significant for creatinine elevated up to 1.75, BUN 31, and WBC elevated to 11.8. Speech therapy was able to formally evaluate todayand recommended dysphagia 3 mechanically soft diet with honey thick liquids meals. Patient was placed on D5-0.9%NSat 75 mL/h.  Assessment & Plan:   Active Problems:   AKI (acute kidney injury) ([place])   Acute ischemic stroke (HCC)   Aspiration pneumonia (HCC)   Dysphagia   Cough  #1 acute hypoxic respiratory failure secondary to aspiration pneumonia-he was started on Unasyn  09/08/2019 with improvement in his white count.  He was on 5 L of oxygen to maintain his saturation.   Seen by speech therapy recommended dysphagia 3 diet.  Continue aspiration precautions. Continue Unasyn. Titrate oxygen to keep her saturation above 88%. 09/05/2019 bilateral pneumonia and stable cardiomegaly.  #2 hyper natremia-his sodium still remains elevated at 148.  Patient with decreased p.o. intake.  Wife reports he does not like to drink thickened water.  We will  change his fluids to D5 half-normal and recheck labs in a.m.  If improved plan discharge to skilled nursing facility 09/11/2019.  #3  stroke like episode status post TPA work-up has been negative.  MRI negative.  Patient had the same episodes multiple times in the last few years.   CT head no acute abnormality, small vessel disease and atrophy noted. CT angiogram of the head and neck right ICA 35% stenosis mild atherosclerosis at the left ICA bifurcation. Hemoglobin A1c 5.2, LDL 65, EEG no seizures Echocardiogram ejection fraction 50 to 55%. MRI of the brain no acute infarct old right parietal infarct noted. Unremarkable carotid ultrasound.  #4 history of essential hypertension on amlodipine bp 129/70  #5 history of hyperlipidemia on Lipitor and fish oil.  #6 history of dementia with cognitive decline and sundowning on Seroquel 25 nightly.  #7 BPH continue Flomax.  #8 hypokalemia potassium 3. 3.6 replete.  Check magnesium 1.7 repleted.  #9 AKI on CKD stage III resolved.  #10 hypernatremia change fluids to D5 half-normal recheck labs in a.m.   Pressure Injury 06/24/19 Sacrum Posterior Stage 1 -  Intact skin with non-blanchable redness of a localized area usually over a bony prominence. (Active)  06/24/19 0934  Location: Sacrum  Location Orientation: Posterior  Staging: Stage 1 -  Intact skin with non-blanchable redness of a localized area usually over a bony prominence.  Wound Description (Comments):   Present on Admission:       Nutrition Problem: Moderate Malnutrition Etiology: chronic illness(prior stroke and multiple TIAs)     Signs/Symptoms: mild fat depletion, mild muscle depletion, percent weight loss, energy intake < 75% for > or  equal to 1 month Percent weight loss: 15 %    Interventions: Magic cup  Estimated body mass index is 25.27 kg/m as calculated from the following:   Height as of this encounter: 5\' 11"  (1.803 m).   Weight as of this encounter:  82.2 kg.  DVT prophylaxis: heparin Code Status: DO NOT RESUSCITATE Family Communication: dw wife Disposition Plan:  Status is: Inpatient  Dispo: The patient is from:home  Anticipated d/c is to:snf  Anticipated d/c date is unknown  Patient currently is not medically stable to d/c.patient is aspirating and hypoxic, needs IV antibiotics, is also hypernatremic with decreased p.o. intake need to continue IV fluids for another 24 hours encourage p.o. intake discussed with wife.  Covid SNF pending   Consultants: Neurology Procedures status post TPA Antimicrobials:unasyn  Subjective:  Patient resting in bed he denies any new complaints today He was initially on 5 L of oxygen was able to taper down to 2 L  Objective: Vitals:   09/10/19 0221 09/10/19 0344 09/10/19 0740 09/10/19 1200  BP:  134/67 (!) 157/64 129/70  Pulse:  80 66 67  Resp:  (!) 24 18 18   Temp:   97.6 F (36.4 C) 98.4 F (36.9 C)  TempSrc:   Oral Axillary  SpO2:  95% 98% 94%  Weight: 82.2 kg     Height:        Intake/Output Summary (Last 24 hours) at 09/10/2019 1322 Last data filed at 09/10/2019 0600 Gross per 24 hour  Intake 1008.21 ml  Output 615 ml  Net 393.21 ml   Filed Weights   09/03/19 1400 09/09/19 0500 09/10/19 0221  Weight: 85.2 kg 84.4 kg 82.2 kg    Examination:  General exam: Appears calm and comfortable  Respiratory system: coarse to auscultation. Respiratory effort normal. Cardiovascular system: S1 & S2 heard, RRR. No JVD, murmurs, rubs, gallops or clicks. No pedal edema. Gastrointestinal system: Abdomen is nondistended, soft and nontender. No organomegaly or masses felt. Normal bowel sounds heard. Central nervous system: Alert and oriented. No focal neurological deficits. Extremities: Symmetric 5 x 5 power. Skin: No rashes, lesions or ulcers Psychiatry: Judgement and insight appear normal. Mood & affect appropriate.     Data Reviewed: I have personally  reviewed following labs and imaging studies  CBC: Recent Labs  Lab 09/03/19 1410 09/03/19 1414 09/06/19 0448 09/07/19 0339 09/08/19 0431 09/09/19 0444 09/10/19 0526  WBC 8.0   < > 11.7* 10.9* 5.7 5.6 7.5  NEUTROABS 5.6  --   --   --   --   --   --   HGB 12.3*   < > 12.7* 11.7* 11.6* 10.4* 10.6*  HCT 38.5*   < > 38.5* 35.9* 35.1* 32.4* 33.0*  MCV 101.6*   < > 96.3 96.5 98.3 97.6 97.3  PLT 167   < > 167 160 154 155 170   < > = values in this interval not displayed.   Basic Metabolic Panel: Recent Labs  Lab 09/06/19 0448 09/07/19 0339 09/08/19 0431 09/09/19 0444 09/09/19 1202 09/10/19 0526  NA 141 139 145 148*  --  148*  K 4.2 3.7 3.9 3.3*  --  3.6  CL 110 109 116* 119*  --  114*  CO2 22 20* 23 23  --  23  GLUCOSE 84 133* 121* 118*  --  93  BUN 19 31* 36* 26*  --  20  CREATININE 1.39* 1.75* 1.69* 1.14  --  1.09  CALCIUM 9.4 9.0 8.9 8.6*  --  8.9  MG  --   --   --   --  1.7  --    GFR: Estimated Creatinine Clearance: 42.2 mL/min (by C-G formula based on SCr of 1.09 mg/dL). Liver Function Tests: Recent Labs  Lab 09/03/19 1410 09/10/19 0526  AST 30 28  ALT 12 17  ALKPHOS 53 48  BILITOT 0.7 1.2  PROT 6.0* 5.3*  ALBUMIN 3.5 2.5*   No results for input(s): LIPASE, AMYLASE in the last 168 hours. No results for input(s): AMMONIA in the last 168 hours. Coagulation Profile: Recent Labs  Lab 09/03/19 1410  INR 1.1   Cardiac Enzymes: No results for input(s): CKTOTAL, CKMB, CKMBINDEX, TROPONINI in the last 168 hours. BNP (last 3 results) No results for input(s): PROBNP in the last 8760 hours. HbA1C: No results for input(s): HGBA1C in the last 72 hours. CBG: Recent Labs  Lab 09/08/19 1206 09/08/19 1548 09/08/19 2130 09/09/19 0610 09/09/19 2050  GLUCAP 125* 129* 120* 114* 100*   Lipid Profile: No results for input(s): CHOL, HDL, LDLCALC, TRIG, CHOLHDL, LDLDIRECT in the last 72 hours. Thyroid Function Tests: No results for input(s): TSH, T4TOTAL, FREET4,  T3FREE, THYROIDAB in the last 72 hours. Anemia Panel: No results for input(s): VITAMINB12, FOLATE, FERRITIN, TIBC, IRON, RETICCTPCT in the last 72 hours. Sepsis Labs: Recent Labs  Lab 09/08/19 0431  PROCALCITON 0.78    Recent Results (from the past 240 hour(s))  SARS Coronavirus 2 by RT PCR (hospital order, performed in [facility] hospital lab) Nasopharyngeal Nasopharyngeal Swab     Status: None   Collection Time: 09/03/19  4:30 PM   Specimen: Nasopharyngeal Swab  Result Value Ref Range Status   SARS Coronavirus 2 NEGATIVE NEGATIVE Final    Comment: (NOTE) SARS-CoV-2 target nucleic acids are NOT DETECTED. The SARS-CoV-2 RNA is generally detectable in upper and lower respiratory specimens during the acute phase of infection. The lowest concentration of SARS-CoV-2 viral copies this assay can detect is 250 copies / mL. A negative result does not preclude SARS-CoV-2 infection and should not be used as the sole basis for treatment or other patient management decisions.  A negative result may occur with improper specimen collection / handling, submission of specimen other than nasopharyngeal swab, presence of viral mutation(s) within the areas targeted by this assay, and inadequate number of viral copies (<250 copies / mL). A negative result must be combined with clinical observations, patient history, and epidemiological information. Fact Sheet for Patients:   StrictlyIdeas.no Fact Sheet for Healthcare Providers: BankingDealers.co.za This test is not yet approved or cleared  by the [place] FDA and has been authorized for detection and/or diagnosis of SARS-CoV-2 by FDA under an Emergency Use Authorization (EUA).  This EUA will remain in effect (meaning this test can be used) for the duration of the COVID-19 declaration under Section 564(b)(1) of the Act, 21 U.S.C. section 360bbb-3(b)(1), unless the authorization is terminated  or revoked sooner. Performed at [facility], [place] [address], [place], [place] [address]   MRSA PCR Screening     Status: None   Collection Time: 09/03/19  4:30 PM   Specimen: Nasopharyngeal Swab  Result Value Ref Range Status   MRSA by PCR NEGATIVE NEGATIVE Final    Comment:        The GeneXpert MRSA Assay (FDA approved for NASAL specimens only), is one component of a comprehensive MRSA colonization surveillance program. It is not intended to diagnose MRSA infection nor to guide or monitor treatment for  MRSA infections. Performed at [facility], [place] [address], [place], [place] [address]   SARS CORONAVIRUS 2 (TAT 6-24 HRS) Nasopharyngeal Nasopharyngeal Swab     Status: None   Collection Time: 09/06/19  4:47 PM   Specimen: Nasopharyngeal Swab  Result Value Ref Range Status   SARS Coronavirus 2 NEGATIVE NEGATIVE Final    Comment: (NOTE) SARS-CoV-2 target nucleic acids are NOT DETECTED. The SARS-CoV-2 RNA is generally detectable in upper and lower respiratory specimens during the acute phase of infection. Negative results do not preclude SARS-CoV-2 infection, do not rule out co-infections with other pathogens, and should not be used as the sole basis for treatment or other patient management decisions. Negative results must be combined with clinical observations, patient history, and epidemiological information. The expected result is Negative. Fact Sheet for Patients: SugarRoll.be Fact Sheet for Healthcare Providers: https://www.woods-Doratha Mcswain.com/ This test is not yet approved or cleared by the [place] FDA and  has been authorized for detection and/or diagnosis of SARS-CoV-2 by FDA under an Emergency Use Authorization (EUA). This EUA will remain  in effect (meaning this test can be used) for the duration of the COVID-19 declaration under Section 56 4(b)(1) of the Act, 21 U.S.C. section 360bbb-3(b)(1), unless the  authorization is terminated or revoked sooner. Performed at [facility], [place] [address], [place], [place] [address]          Radiology Studies: DG Chest 1 View  Result Date: 09/10/2019 CLINICAL DATA:  Hypoxemia EXAM: CHEST  1 VIEW COMPARISON:  2 days ago FINDINGS: Cardiomegaly and bilateral pulmonary infiltrate that is unchanged. No visible effusion or pneumothorax. IMPRESSION: Stable cardiomegaly and bilateral pneumonia. Electronically Signed   By: Monte Fantasia M.D.   On: 09/10/2019 07:58        Scheduled Meds: . amLODipine  10 mg Oral Daily  . aspirin  81 mg Oral Daily  . atorvastatin  10 mg Oral Daily  . Chlorhexidine Gluconate Cloth  6 each Topical Daily  . heparin injection (subcutaneous)  5,000 Units Subcutaneous Q8H  . multivitamin with minerals  1 tablet Oral Daily  . pantoprazole  40 mg Oral Daily  . QUEtiapine  25 mg Oral QHS  . tamsulosin  0.4 mg Oral QHS   Continuous Infusions: . sodium chloride 75 mL/hr at 09/09/19 1202  . ampicillin-sulbactam (UNASYN) IV 3 g (09/10/19 1052)  . dextrose 5 % and 0.45% NaCl       LOS: 7 days     Georgette Shell, MD 09/10/2019, 1:22 PM

## 2019-09-10 NOTE — Progress Notes (Addendum)
Nutrition Follow-up  DOCUMENTATION CODES:   Non-severe (moderate) malnutrition in context of chronic illness  INTERVENTION:  D/c Nepro shake due to need for Honey Thick liquids  Continue Magic cup TID with meals, each supplement provides 290 kcal and 9 grams of protein  Encourage adequate po intake  RD will order additional supplements pending response from SLP and potential advancement of diet.   NUTRITION DIAGNOSIS:   Moderate Malnutrition related to chronic illness(prior stroke and multiple TIAs) as evidenced by mild fat depletion, mild muscle depletion, percent weight loss, energy intake < 75% for > or equal to 1 month.   GOAL:   Patient will meet greater than or equal to 90% of their needs  Not met.   MONITOR:   PO intake, Supplement acceptance, Weight trends, Skin, Diet advancement, Labs  REASON FOR ASSESSMENT:   Malnutrition Screening Tool    ASSESSMENT:   Pt with PMH of aortic atherosclerosis, bradycardia, CKD 3, prior stroke with R eye deficits, multiple TIAs, and recent admission for UTI per wife poor appetite few days to weeks PTA now admitted with stroke symptoms s/p IV tPA.  Discussed pt with RN.   Pt not eating well. Unfortunately, Magic Cups are the only pre-made Honey Thick supplement available. Pt currently has orders for Nepro Shake BID, but unsure if this is being thickened properly at this time. Per pt's wife, plan is for the pt to discharge on Nectar thick liquids instead of Honey Thick liquids. Discussed supplement options for both honey and nectar thick liquids. RD sent secure chat to SLP to discuss discharge plans and supplement options while admitted.  Per previous RD note, pt's wife reported that pt has lost at least 20 lb. She reports usual weight of 200 lb and that he lost down to 170 lb (15% weight loss x 4 months) after fall in January 2021. She reports he has no appetite.  24 recall:  Breakfast: sausage, biscuit, banana, OJ or egg sausage,  banana and OJ (biggest meal)  Lunch: soup Dinner: nothing or bites  PO Intake: 0-100% x last 8 recorded meals (27.5% average meal intake)  UOP: 1,2101m x24 hours I/O: -3,946.320msince admit  Labs: Na 148 (H) CBGs 100-114 Medications reviewed and include: MVI   NUTRITION - FOCUSED PHYSICAL EXAM:    Most Recent Value  Orbital Region  Mild depletion  Upper Arm Region  Mild depletion  Thoracic and Lumbar Region  Mild depletion  Buccal Region  Mild depletion  Temple Region  Mild depletion  Clavicle Bone Region  Moderate depletion  Clavicle and Acromion Bone Region  Moderate depletion  Scapular Bone Region  Moderate depletion  Dorsal Hand  Mild depletion  Patellar Region  Mild depletion  Anterior Thigh Region  Mild depletion  Posterior Calf Region  Mild depletion  Edema (RD Assessment)  None  Hair  Reviewed  Eyes  Reviewed  Mouth  Reviewed  Skin  Reviewed  Nails  Reviewed       Diet Order:   Diet Order            DIET DYS 3 Room service appropriate? Yes with Assist; Fluid consistency: Honey Thick  Diet effective now              EDUCATION NEEDS:   No education needs have been identified at this time  Skin:  Skin Assessment: Skin Integrity Issues: Skin Integrity Issues:: Stage I, Other (Comment) Stage I: sacrum Other: abrasions on scrotum  Last BM:  5/25 type  6  Height:   Ht Readings from Last 1 Encounters:  09/04/19 5' 11" (1.803 m)    Weight:   Wt Readings from Last 1 Encounters:  09/10/19 82.2 kg    Ideal Body Weight:  78.1 kg  BMI:  Body mass index is 25.27 kg/m.  Estimated Nutritional Needs:   Kcal:  2000-2200  Protein:  110-120 grams  Fluid:  >2 L/day    Larkin Ina, MS, RD, LDN RD pager number and weekend/on-call pager number located in [place].

## 2019-09-10 NOTE — TOC Progression Note (Signed)
Transition of Care [facility]) - Progression Note    Patient Details  Name: Robert [name] MRN: [id number] Date of Birth: 19-Jan-1921  Transition of Care [facility]) CM/SW [place], [place] Phone Number: 09/10/2019, 1:46 PM  Clinical Narrative:   CSW alerted by MD to plan for discharge tomorrow, pending medical stability. CSW contacted [employer] to ensure that authorization was still valid, they updated the date of admission to SNF for tomorrow. CSW then contacted [facility] to inform them to plan for admission tomorrow, left a voicemail for social worker Crystal. Patient will need new COVID test, MD placed order. CSW to follow.    Expected Discharge Plan: [place] Barriers to Discharge: Continued Medical Work up  Expected Discharge Plan and Services Expected Discharge Plan: [place] In-house Referral: Clinical Social Work   Post Acute Care Choice: [place] Living arrangements for the past 2 months: [place]                                       Social Determinants of Health (SDOH) Interventions    Readmission Risk Interventions Readmission Risk Prevention Plan 09/06/2019  Transportation Screening Complete  PCP or Specialist Appt within 5-7 Days Complete  Home Care Screening Complete  Medication Review (RN CM) Complete  Some recent data might be hidden

## 2019-09-10 NOTE — Progress Notes (Addendum)
STROKE TEAM PROGRESS NOTE   INTERVAL HISTORY  Robert Robert [name] is  at the bedside.  Patient awake alert, oriented, looks better today.  On 4lits nasal canula maintaining O2 sats at 94% on Unasyn for aspiration pneumonia..  Afebrile.  White count normal.  Slightly hyponatremic and potassium normalized Vitals:   09/10/19 0221 09/10/19 0344 09/10/19 0740 09/10/19 1200  BP:  134/67 (!) 157/64 129/70  Pulse:  80 66 67  Resp:  (!) 24 18 18   Temp:   97.6 F (36.4 C) 98.4 F (36.9 C)  TempSrc:   Oral Axillary  SpO2:  95% 98% 94%  Weight: 82.2 kg     Height:        CBC:  Recent Labs  Lab 09/03/19 1410 09/03/19 1414 09/09/19 0444 09/10/19 0526  WBC 8.0   < > 5.6 7.5  NEUTROABS 5.6  --   --   --   HGB 12.3*   < > 10.4* 10.6*  HCT 38.5*   < > 32.4* 33.0*  MCV 101.6*   < > 97.6 97.3  PLT 167   < > 155 170   < > = values in this interval not displayed.    Basic Metabolic Panel:  Recent Labs  Lab 09/09/19 0444 09/09/19 1202 09/10/19 0526  NA 148*  --  148*  K 3.3*  --  3.6  CL 119*  --  114*  CO2 23  --  23  GLUCOSE 118*  --  93  BUN 26*  --  20  CREATININE 1.14  --  1.09  CALCIUM 8.6*  --  8.9  MG  --  1.7  --    Lipid Panel:     Component Value Date/Time   CHOL 134 09/04/2019 0353   TRIG 60 09/04/2019 0353   HDL 57 09/04/2019 0353   CHOLHDL 2.4 09/04/2019 0353   VLDL 12 09/04/2019 0353   LDLCALC 65 09/04/2019 0353   HgbA1c:  Lab Results  Component Value Date   HGBA1C 5.2 09/04/2019   Urine Drug Screen:     Component Value Date/Time   LABOPIA NONE DETECTED 09/03/2019 1636   COCAINSCRNUR NONE DETECTED 09/03/2019 1636   LABBENZ NONE DETECTED 09/03/2019 1636   AMPHETMU NONE DETECTED 09/03/2019 1636   THCU NONE DETECTED 09/03/2019 1636   LABBARB NONE DETECTED 09/03/2019 1636    Alcohol Level     Component Value Date/Time   ETH <10 09/03/2019 1410    IMAGING past 24 hours  DG Chest 1 View  Result Date: 09/10/2019 CLINICAL DATA:  Hypoxemia EXAM: CHEST  1 VIEW  COMPARISON:  2 days ago FINDINGS: Cardiomegaly and bilateral pulmonary infiltrate that is unchanged. No visible effusion or pneumothorax. IMPRESSION: Stable cardiomegaly and bilateral pneumonia. Electronically Signed   By: Monte Fantasia M.D.   On: 09/10/2019 07:58    PHYSICAL EXAM  Temp:  [97.6 F (36.4 C)-98.7 F (37.1 C)] 98.4 F (36.9 C) (05/25 1200) Pulse Rate:  [60-90] 67 (05/25 1200) Resp:  [18-24] 18 (05/25 1200) BP: (129-157)/(64-87) 129/70 (05/25 1200) SpO2:  [90 %-98 %] 94 % (05/25 1200) Weight:  [82.2 kg] 82.2 kg (05/25 0221)  General - Well nourished, well developed, less lethargic but still frequent coughing.  Ophthalmologic - fundi not visualized due to noncooperation.  Cardiovascular - Regular rhythm and rate.  Mental Status -  Level of arousal and orientation to place, time and person were intac. Language including expression, naming, repetition, comprehension was assessed and found intact. Moderate  dysarthria  Cranial Nerves II - XII - II - Visual field intact OU.  III, IV, VI - Extraocular movements intact. V - Facial sensation intact bilaterally. VII - Facial movement intact bilaterally. VIII - Hearing & vestibular intact bilaterally. X - Palate elevates symmetrically. XI - Chin turning & shoulder shrug intact bilaterally. XII - Tongue protrusion intact.  Motor Strength - The patient's strength was symmetrical in all extremities and pronator drift was absent.  Bulk was normal and fasciculations were absent.   Motor Tone - Muscle tone was assessed at the neck and appendages and was normal.  Reflexes - The patient's reflexes were symmetrical in all extremities and he had no pathological reflexes.  Sensory - Light touch, temperature/pinprick were assessed and were symmetrical.    Coordination - The patient had normal movements in the hands with no ataxia or dysmetria,   Bilateral mild action tremor.  Gait and Station -  deferred.   ASSESSMENT/PLAN Robert Robert [name] is a 86 y.o. male with history of stroke, TIA, HTN, HLD, Aortic atherosclerosis, ICA stenosis presenting with slurred speech, confusion, and aphasia. Received IV tPA 09/03/2019 at 1434.   Stroke-like episode s/p tPA - MRI neg - similar episodes 2012, 2013, 2016 and 2019, MRI all negative, diagnosed with TIAs  Code Stroke CT head No acute abnormality. Small vessel disease. Atrophy. Old R parietal infarct. ASPECTS 10.     CTA head & neck R ICA 35% stenosis. Mild atherosclerosis L ICA bifurcation.   CUS unremarkable  MRI  No acute infarct. Small vessel disease. Old R parietal infarct.  CT head repeat no ICH. Small vessel disease. Atrophy.   2D Echo EF 50-55%  EEG no sz  LDL 65  HgbA1c 5.2  Lovenox 40 mg sq daily for VTE prophylaxis  aspirin 81 mg daily prior to admission, now on ASA 81mg     Therapy recommendations:  SNF  Disposition:  SNF at [facility]. COVID testing neg  Aspiration penumonia Respiratory distress  5/21 Overnight and 5/22 am coughing with liquid during breakfast  O2 sat dropping, fluctuating between 86-93%  [place] -> NRB -> VM -> NRB -> VM -> [place]  Kept NPO -> now passed swallow on dys 3 honey thick  CXR 5/22 concerning for multifocal pneumonia   CXR 5/23 Stable airspace opacities bilaterally as described.  IM consult appreciated  Leukocytosis WBC 8.0->11.8->11.7->5.7 (afebrile)  IV Unasyn started 5/23>> for 3-5 days  AKI on CKD  CKD stage III, Cre 1.45-1.40->1.22->1.39->1.75->1.69  FeNA = 0.03% suggestive of prerenal cause  on IVF @ 75->100cc / hr per IM  Continue monitor   IM assistance appreciated  Cognitive decline Delirium Sundowning  For the last one year, Robert [name] and daughter noted intermittent slurry speech  Patient had delirium and hallucination 5/18 overnight  5/29 patient also had sundowning and abdomen -> Posey belt restraint -> Received 25mg  and addition seroquel 75  5/20 drowsy  sleepy -> 5/21 back to baseline -> 5/22 confusion at night -> 5/23 orientated  On Seroquel 25 hs -> haldol PRN  Hx stroke and TIA  06/2017 - TIA. aphasia and disorientation. Resolved. OP workup w/ B ICA 50% stenosis. AV stenosis. HTN. Continued asa (no plavix d/t falls and concern for bleeding).  12/2014 - Hx stroke w/ residual vision deficits OD. On aspirin. Previously on plavix.   2012, 2013, 2016 - recurrent TIAs with similar presentation as 2019   Hypertension  BP as high as 195/90  Home meds:  None listed  Treated w/ cleviprex, now off  BP goal < 180/105  On amlodipine 10 . Long-term BP goal normotensive  Hyperlipidemia  Home meds:  lipitor 10 and fish oil  LDL 65, at goal < 70  Resume Lipitor 10  Continue statin and fish oil at discharge  Dysphagia   Pt has baseline dysphagia but on soft diet at home  Kept NPO after aspiration  Speech on board today  Balanced quality of life and aspiration risk - now recommend dys 3 with honey thick  On IVF also  Other Stroke Risk Factors  Advanced age  Former Cigarette smoker, quit 11 yrs ago  ETOH use, alcohol level <10, advised to drink no more than 2 drink(s) a day  Aortic atherosclerosis.  Other Active Problems  Prostate cancer  Hospital day # 7  Patient condition is improving and oxygen requirements seem to be coming down.  Robert aspiration pneumonia responding to antibiotics.  Patient will be discharged to nursing home later today.  Discussed with patient's Robert [name] and answered questions.. I discussed with Dr. Zigmund Daniel. I spent  25 minutes in total face-to-face time with the patient, more than 50% of which was spent in counseling and coordination of care, reviewing test results, images and medication, and discussing the diagnosis, treatment plan and potential prognosis. This patient's care requiresreview of multiple databases, neurological assessment, discussion with family, other specialists and medical decision  making of high complexity.  I had long discussion with Robert [name] at bedside, updated pt current condition, treatment plan and potential prognosis, and answered all the questions. Robert [name] expressed understanding and appreciation.  Stroke team will sign off.  Kindly call for questions. Antony Contras, MD Stroke Neurology 09/10/2019 1:05 PM   To contact Stroke Continuity provider, please refer to [email or web address] After hours, contact General Neurology

## 2019-09-11 DIAGNOSIS — E870 Hyperosmolality and hypernatremia: Secondary | ICD-10-CM

## 2019-09-11 LAB — BASIC METABOLIC PANEL
Anion gap: 8 (ref 5–15)
BUN: 15 mg/dL (ref 8–23)
CO2: 25 mmol/L (ref 22–32)
Calcium: 8.8 mg/dL — ABNORMAL LOW (ref 8.9–10.3)
Chloride: 111 mmol/L (ref 98–111)
Creatinine, Ser: 1.04 mg/dL (ref 0.61–1.24)
GFR calc Af Amer: >60 mL/min (ref >60)
GFR calc non Af Amer: >60 mL/min (ref >60)
Glucose, Bld: 109 mg/dL — ABNORMAL HIGH (ref 70–99)
Potassium: 3.5 mmol/L (ref 3.5–5.1)
Sodium: 144 mmol/L (ref 135–145)

## 2019-09-11 LAB — MAGNESIUM: Magnesium: 1.6 mg/dL — ABNORMAL LOW (ref 1.7–2.4)

## 2019-09-11 MED ORDER — MAGNESIUM SULFATE 2 GM/50ML IV SOLN
2.0000 g | Freq: Once | INTRAVENOUS | Status: AC
  Administered 2019-09-11: 2 g via INTRAVENOUS
  Filled 2019-09-11: qty 50

## 2019-09-11 MED ORDER — AMOXICILLIN-POT CLAVULANATE 875-125 MG PO TABS: 1.0000 | ORAL_TABLET | Freq: Two times a day (BID) | ORAL | Status: DC

## 2019-09-11 MED ORDER — RESOURCE THICKENUP CLEAR PO POWD: ORAL | Status: AC

## 2019-09-11 MED ORDER — AMOXICILLIN-POT CLAVULANATE 875-125 MG PO TABS
1.0000 | ORAL_TABLET | Freq: Two times a day (BID) | ORAL | 0 refills | Status: AC
Start: 2019-09-08 — End: 2019-09-12

## 2019-09-11 NOTE — Progress Notes (Signed)
  Speech Language Pathology Treatment: Dysphagia  Patient Details Name: Robert [name] MRN: [id number] DOB: Oct 13, 1920 Today's Date: 09/11/2019 Time: 1015-1040 SLP Time Calculation (min) (ACUTE ONLY): 25 min  Assessment / Plan / Recommendation Clinical Impression  Pt alert and upright, does not like thickened liquids per family report. We discussed best thickening method at length with demo. Provided trials of appropriately thick, cold ginger ale. Pt confirmed he does not like it. When then reviewed the importance of balancing safety, pleasure and need for hydration. Offered strategies for safer intake with beverages of choice including small controlled bolus size and oral hygiene to decrease bacterial load. For now, the pts wife and daughter want him to continue honey thick liquids through admission at SNF as safest diet choice. Wife may choose to liberalize diet for pts comfort once he is back home with safe controlled precautions. For now, continue mech soft and honey thick liquids.   HPI HPI: 86 y.o. male past medical history of aortic atherosclerosis, bradycardia, CKD stage III, history of a prior stroke with residual right eye deficits, relatively recent admission for UTI, nephrolithiasis, history of prostate cancer, multiple TIAs in the past according to the wife, presenting to the ER with slurred speech and confusion. Pt received TPA on 5/18.  Pt underwent a MBS on 06/28/19 which reported moderately severe oropharyngeal dysphagia and risk for aspiration with all consistencies.  SLP recommended Dysphagia 3 (soft) solid and nectar-thick liquids at the time.  Wife reported that pt refused to thicken liquids at home.       SLP Plan  Continue with current plan of care       Recommendations  Diet recommendations: Honey-thick liquid;Dysphagia 3 (mechanical soft) Liquids provided via: Cup;Straw;Teaspoon Medication Administration: Crushed with puree Supervision: Patient able to self  feed Compensations: Minimize environmental distractions;Slow rate;Small sips/bites;Multiple dry swallows after each bite/sip;Effortful swallow Postural Changes and/or Swallow Maneuvers: Seated upright 90 degrees                Oral Care Recommendations: Oral care QID;Oral care prior to ice chip/H20;Staff/trained caregiver to provide oral care Follow up Recommendations: Inpatient Rehab;Skilled Nursing facility SLP Visit Diagnosis: Dysphagia, oropharyngeal phase (R13.12) Plan: Continue with current plan of care       GO                DeBlois, Katherene Ponto 09/11/2019, 1:05 PM

## 2019-09-11 NOTE — Progress Notes (Signed)
Physical Therapy Treatment Patient Details Name: Robert [name] MRN: [id number] DOB: [birth year] Today's Date: 09/11/2019    History of Present Illness 86 y.o. male past medical history of aortic atherosclerosis, bradycardia, CKD stage III, history of a prior stroke with residual right eye deficits, relatively recent admission for UTI, nephrolithiasis, history of prostate cancer, multiple TIAs in the past according to the wife, presenting to the ER with slurred speech and confusion. Pt received TPA on 5/18.     PT Comments    Patient is making progress toward PT goals and able to ambulate ~6 ft X 2 trials this session. Continue to progress as tolerated with anticipated d/c to SNF for further skilled PT services.     Follow Up Recommendations  SNF     Equipment Recommendations  None recommended by PT    Recommendations for Other Services       Precautions / Restrictions Precautions Precautions: Fall Restrictions Weight Bearing Restrictions: No    Mobility  Bed Mobility Overal bed mobility: Needs Assistance Bed Mobility: Supine to Sit     Supine to sit: Mod assist     General bed mobility comments: assist to elevate trunk into sitting and to scoot hips to EOB; HOB elevated   Transfers Overall transfer level: Needs assistance Equipment used: Rolling walker (2 wheeled) Transfers: Sit to/from Stand Sit to Stand: Mod assist         General transfer comment: assist to power up into standing and cues for safety   Ambulation/Gait Ambulation/Gait assistance: Min assist;+2 safety/equipment Gait Distance (Feet): (~71ft X 2 trials with seated break) Assistive device: Rolling walker (2 wheeled);1 person hand held assist Gait Pattern/deviations: Trunk flexed;Step-through pattern;Decreased step length - right;Decreased step length - left Gait velocity: reduced   General Gait Details: assistance for balance and guiding RW; +2 for chair follow   Stairs              Wheelchair Mobility    Modified Rankin (Stroke Patients Only)       Balance Overall balance assessment: Needs assistance Sitting-balance support: Feet supported;Single extremity supported Sitting balance-Leahy Scale: Fair     Standing balance support: Bilateral upper extremity supported;During functional activity Standing balance-Leahy Scale: Poor                              Cognition Arousal/Alertness: Awake/alert Behavior During Therapy: WFL for tasks assessed/performed Overall Cognitive Status: Within Functional Limits for tasks assessed                                 General Comments: a little impulsive however listens to cues for safety; [facility]      Exercises      General Comments        Pertinent Vitals/Pain Pain Assessment: No/denies pain    Home Living                      Prior Function            PT Goals (current goals can now be found in the care plan section) Progress towards PT goals: Progressing toward goals    Frequency    Min 2X/week      PT Plan Current plan remains appropriate    Co-evaluation              AM-PAC PT "6 Clicks" Mobility  Outcome Measure  Help needed turning from your back to your side while in a flat bed without using bedrails?: A Lot Help needed moving from lying on your back to sitting on the side of a flat bed without using bedrails?: A Lot Help needed moving to and from a bed to a chair (including a wheelchair)?: A Lot Help needed standing up from a chair using your arms (e.g., wheelchair or bedside chair)?: A Lot Help needed to walk in hospital room?: A Lot Help needed climbing 3-5 steps with a railing? : Total 6 Click Score: 11    End of Session Equipment Utilized During Treatment: Gait belt;Oxygen Activity Tolerance: Patient tolerated treatment well Patient left: in chair;with call bell/phone within reach;with chair alarm set;with family/visitor  present Nurse Communication: Mobility status PT Visit Diagnosis: Unsteadiness on feet (R26.81);History of falling (Z91.81);Muscle weakness (generalized) (M62.81);Difficulty in walking, not elsewhere classified (R26.2);Other abnormalities of gait and mobility (R26.89)     Time: [id number] PT Time Calculation (min) (ACUTE ONLY): 30 min  Charges:  $Gait Training: 23-37 mins                     Earney Navy, PTA Acute Rehabilitation Services Pager: [phone] Office: [phone]     Darliss Cheney 09/11/2019, 2:27 PM

## 2019-09-11 NOTE — TOC Transition Note (Signed)
Transition of Care [facility]) - CM/SW Discharge Note   Patient Details  Name: Robert [name] MRN: [id number] Date of Birth: November 16, 1920  [facility] CM/SW Contact:  Geralynn Ochs, LCSW Phone Number: 09/11/2019, 10:35 AM   Clinical Narrative:   Nurse to call report to [phone], Room 219    Final next level of care: Skilled Nursing Facility Barriers to Discharge: Barriers Resolved   Patient Goals and CMS Choice Patient states their goals for this hospitalization and ongoing recovery are:: Rehab CMS Medicare.gov Compare Post Acute Care list provided to:: Patient Represenative (must comment)(Spouse) Choice offered to / list presented to : [place] / [place]  Discharge Placement              Patient chooses bed at: [facility] Patient to be transferred to facility by: [place] Name of family member notified: Wife Patient and family notified of of transfer: 09/11/19  Discharge Plan and Services In-house Referral: Clinical Social Work   Post Acute Care Choice: [place]                               Social Determinants of Health ([place]) Interventions     Readmission Risk Interventions Readmission Risk Prevention Plan 09/06/2019  Transportation Screening Complete  PCP or Specialist Appt within 5-7 Days Complete  Home Care Screening Complete  Medication Review (RN CM) Complete  Some recent data might be hidden

## 2019-09-11 NOTE — Discharge Summary (Addendum)
Physician Discharge Summary  Robert [name] [id number] DOB: 1920/12/24 DOA: 09/03/2019  PCP: Lauree Chandler, NP  Admit date: 09/03/2019 Discharge date: 09/11/2019  Time spent: 45 minutes  Recommendations for Outpatient Follow-up:  Patient will be discharged to skilled nursing facility, continue physical, occupational, and speech therapy.  Patient will need to follow up with primary care provider within one week of discharge.  Follow up with neurology. Patient should continue medications as prescribed as well as supplemental oxygen.  Patient should follow a dysphagia 3 diet with honey thick liquids.  Discharge Diagnoses:  Acute hypoxic respiratory failure secondary to aspiration pneumonia Hypernatremia Stroke-like episode Essential hypertension Hyperlipidemia Hypomagnesemia BPH Hypokalemia Acute kidney injury on chronic kidney disease, stage IIIa Dementia with cognitive decline and sundowning Moderate malnutrition  Discharge Condition: Stable  Diet recommendation: Dysphagia 3  Filed Weights   09/03/19 1400 09/09/19 0500 09/10/19 0221  Weight: 85.2 kg 84.4 kg 82.2 kg    History of present illness:  On 09/03/2019 by Dr. Amie Portland (neurology) Robert [name] is a 86 y.o. male past medical history of aortic atherosclerosis, bradycardia, CKD stage III, history of a prior stroke with residual right eye deficits, relatively recent admission for UTI, nephrolithiasis, history of prostate cancer, multiple TIAs in the past according to the wife, presenting to the emergency room for evaluation of strokelike symptoms. His last known normal was 10:30 AM, and wife noted that around 1130 his speech was slurred and he was acting confused-words not making sense.  Symptoms were intermittent but not completely resolved.  He was brought in by the EMS crew to the emergency room and evaluated by the ED provider who activated a code stroke because he was within the window for IV TPA and  intervention. The wife reports that he has been feeling rather sickly over the past few days to weeks with a poor appetite.  He walks with a walker at best with assistance.  Requires home health care aide assistance with bathing and other ADLs.  He is able to feed himself independently. He has not been complaining currently of any chest pain shortness of breath nausea or vomiting.  Hospital Course:  Acute hypoxic respiratory failure secondary to aspiration pneumonia/Dysphagia -Patient was initially started on Unasyn on 09/08/2019 -Speech therapy consulted and recommended dysphagia 3 diet with aspiration precautions -Currently on supplemental oxygen, weaned down to 2L from 5L -will discharge with Augmentin and continued supplemental oxygen  Hypernatremia -Sodium was elevated to 148 -Likely secondary to decreased oral intake -Per family, patient does not like to drink thickened water -Was given IV fluids -Sodium down to 144 -Repeat BMP in 1 week -Patient may need occasional IV fluids while at nursing facility  Stroke-like episode -status post tPA -CT head and MRI unremarkable for acute infarct -CTA head and neck showed right ICA 35% stenosis.  Mild atherosclerosis L ICA bifurcation -Cardiogram showed an EF of 50-55 -EEG unremarkable for seizure -LDL 6, hemoglobin A1c 5.2 -continue aspirin, statin -PT, OT recommending SNF  Essential hypertension -Stable, continue amlodipine  Hyperlipidemia -Continue statin, fish oil  Hypomagnesemia -Magnesium replaced, recheck in 1 week  BPH -Continue Flomax  Hypokalemia -Stable, repeat BMP in 1 week  Acute kidney injury on chronic kidney disease, stage IIIa -Resolved  Dementia with cognitive decline and sundowning -Currently stable, continue Seroquel 25 mg nightly  Moderate malnutrition -Nutrition consulted, continue supplements  Code status: DNR  Procedures: Echocardiogram  Consultations: Neurology  Discharge Exam: Vitals:    09/11/19 0250 09/11/19 [id number]  BP: (!) 152/71 (!) 146/81  Pulse:  83  Resp: (!) 25 20  Temp: 98.2 F (36.8 C) 98.8 F (37.1 C)  SpO2: 92% 92%     General: Well developed, well nourished, elderly, NAD, appears stated age  HEENT: NCAT, mucous membranes moist.  Cardiovascular: S1 S2 auscultated, RRR  Respiratory: Diminished breath sounds, cough  Abdomen: Soft, nontender, nondistended, + bowel sounds  Extremities: warm dry without cyanosis clubbing or edema  Neuro: AAOx2, nonfocal, history of dementia  Psych: Normal affect and demeanor, pleasant   Discharge Instructions Discharge Instructions    Ambulatory referral to Neurology   Complete by: As directed    An appointment is requested in approximately: 4 to 6 weeks. May see Nurse Practitioner.   Discharge instructions   Complete by: As directed    Patient will be discharged to skilled nursing facility, continue physical, occupational, and speech therapy.  Patient will need to follow up with primary care provider within one week of discharge.  Follow up with neurology. Patient should continue medications as prescribed as well as supplemental oxygen.  Patient should follow a dysphagia 3 diet with honey thick liquids.     Allergies as of 09/11/2019      Reactions   Morphine And Related Nausea And Vomiting   Sulfamethoxazole-trimethoprim Rash   Tramadol Nausea And Vomiting      Medication List    STOP taking these medications   multivitamin tablet     TAKE these medications   acetaminophen 325 MG tablet Commonly known as: Tylenol Take 2 tablets (650 mg total) by mouth every 6 (six) hours as needed. What changed: reasons to take this   amLODipine 10 MG tablet Commonly known as: NORVASC Take 1 tablet (10 mg total) by mouth daily.   amoxicillin-clavulanate 875-125 MG tablet Commonly known as: Augmentin Take 1 tablet by mouth 2 (two) times daily for 4 days.   aspirin EC 81 MG tablet Take 81 mg by mouth daily.     atorvastatin 10 MG tablet Commonly known as: LIPITOR TAKE 1 TABLET BY MOUTH DAILY What changed: how much to take   DermaMed ointment Generic drug: aluminum hydroxide Apply 1 application topically every 12 (twelve) hours as needed (skin breakdown).   feeding supplement (NEPRO CARB STEADY) Liqd Take 237 mLs by mouth daily.   loratadine 10 MG tablet Commonly known as: CLARITIN Take 10 mg by mouth daily as needed for allergies.   Melatonin 10 MG Tabs Take 5-10 mg by mouth at bedtime as needed (sleep).   multivitamin with minerals Tabs tablet Take 1 tablet by mouth daily.   OMEGA-3 FISH OIL PO Take 1 capsule by mouth daily.   QUEtiapine 25 MG tablet Commonly known as: SEROQUEL Take 1 tablet (25 mg total) by mouth at bedtime as needed (agitation, sundowning, delirium).   Resource ThickenUp Clear Powd Use as needed to thicken liquids   senna 8.6 MG tablet Commonly known as: SENOKOT Take 1 tablet by mouth as needed for constipation.   tamsulosin 0.4 MG Caps capsule Commonly known as: FLOMAX Take 0.4 mg by mouth at bedtime.      Allergies  Allergen Reactions  . Morphine And Related Nausea And Vomiting  . Sulfamethoxazole-Trimethoprim Rash  . Tramadol Nausea And Vomiting    Contact information for follow-up providers    Lauree Chandler, NP Follow up in 2 day(s).   Specialty: Geriatric Medicine Why: Have your blood pressure evaluated at your next appointment. Contact information: [place].  [place] [id number]        Garvin Fila, MD Follow up in 6 week(s).   Specialties: Neurology, Radiology Why: Office will call you with appointment. Contact information: [address] [place] [address] [phone]            Contact information for after-discharge care    Destination    [facility] .   Service: Skilled Nursing Contact information: [place]  [place] [phone]                   The results of significant diagnostics from this hospitalization (including imaging, microbiology, ancillary and laboratory) are listed below for reference.    Significant Diagnostic Studies: CT ANGIO HEAD W OR WO CONTRAST  Result Date: 09/03/2019 CLINICAL DATA:  Acute neuro deficit.  Aphasia. EXAM: CT ANGIOGRAPHY HEAD AND NECK TECHNIQUE: Multidetector CT imaging of the head and neck was performed using the standard protocol during bolus administration of intravenous contrast. Multiplanar CT image reconstructions and MIPs were obtained to evaluate the vascular anatomy. Carotid stenosis measurements (when applicable) are obtained utilizing NASCET criteria, using the distal internal carotid diameter as the denominator. CONTRAST:  21mL OMNIPAQUE IOHEXOL 350 MG/ML SOLN COMPARISON:  CT head 09/03/2019 FINDINGS: CTA NECK FINDINGS Aortic arch: Standard branching. Imaged portion shows no evidence of aneurysm or dissection. No significant stenosis of the major arch vessel origins. Atherosclerotic calcification aortic arch. Right carotid system: Atherosclerotic calcification proximal right internal carotid artery. Minimal lumen diameter 3.1 mm corresponding to 35% diameter stenosis. Left carotid system: Atherosclerotic calcification left carotid bifurcation. There is significant left carotid stenosis. Vertebral arteries: Both vertebral arteries widely patent without significant stenosis. Skeleton: Moderate degenerative changes cervical spine. No acute skeletal abnormality Other neck: Negative for mass or adenopathy. Upper chest: Right apical emphysema with pleuroparenchymal scarring. Review of the MIP images confirms the above findings CTA HEAD FINDINGS Anterior circulation: Atherosclerotic calcification in the cavernous carotid bilaterally. No significant stenosis. Anterior and middle cerebral arteries patent bilaterally without significant stenosis. Posterior  circulation: Both vertebral arteries patent to the basilar. PICA patent bilaterally. Basilar widely patent. Superior cerebellar and posterior cerebral arteries patent bilaterally without stenosis. Venous sinuses: Normal venous enhancement. Right transverse sinus dominant. Anatomic variants: None Review of the MIP images confirms the above findings IMPRESSION: 1. 35% diameter stenosis proximal right internal carotid artery. 2. Mild atherosclerotic disease left carotid bifurcation without significant stenosis. 3. Both vertebral arteries patent to the basilar without stenosis. 4. No significant intracranial stenosis or large vessel occlusion. Electronically Signed   By: Franchot Gallo M.D.   On: 09/03/2019 14:59   DG Chest 1 View  Result Date: 09/10/2019 CLINICAL DATA:  Hypoxemia EXAM: CHEST  1 VIEW COMPARISON:  2 days ago FINDINGS: Cardiomegaly and bilateral pulmonary infiltrate that is unchanged. No visible effusion or pneumothorax. IMPRESSION: Stable cardiomegaly and bilateral pneumonia. Electronically Signed   By: Monte Fantasia M.D.   On: 09/10/2019 07:58   CT HEAD WO CONTRAST  Result Date: 09/04/2019 CLINICAL DATA:  Stroke.  24 hours post tPA. EXAM: CT HEAD WITHOUT CONTRAST TECHNIQUE: Contiguous axial images were obtained from the base of the skull through the vertex without intravenous contrast. COMPARISON:  CT head 09/03/2019 FINDINGS: Brain: Advanced atrophy. Negative for hydrocephalus. Chronic infarct high right parietal lobe is unchanged. Chronic microvascular ischemic changes in the white matter. Negative for acute infarct, hemorrhage, mass.  No midline shift. Vascular: Negative for hyperdense vessel. Atherosclerotic calcification  in the carotid and vertebral arteries. Skull: Negative Sinuses/Orbits: Paranasal sinuses clear. Bilateral cataract extraction. Other: None IMPRESSION: Negative for acute hemorrhage Atrophy and chronic ischemic changes stable from yesterday. Electronically Signed   By:  Franchot Gallo M.D.   On: 09/04/2019 13:19   CT ANGIO NECK W OR WO CONTRAST  Result Date: 09/03/2019 CLINICAL DATA:  Acute neuro deficit.  Aphasia. EXAM: CT ANGIOGRAPHY HEAD AND NECK TECHNIQUE: Multidetector CT imaging of the head and neck was performed using the standard protocol during bolus administration of intravenous contrast. Multiplanar CT image reconstructions and MIPs were obtained to evaluate the vascular anatomy. Carotid stenosis measurements (when applicable) are obtained utilizing NASCET criteria, using the distal internal carotid diameter as the denominator. CONTRAST:  41mL OMNIPAQUE IOHEXOL 350 MG/ML SOLN COMPARISON:  CT head 09/03/2019 FINDINGS: CTA NECK FINDINGS Aortic arch: Standard branching. Imaged portion shows no evidence of aneurysm or dissection. No significant stenosis of the major arch vessel origins. Atherosclerotic calcification aortic arch. Right carotid system: Atherosclerotic calcification proximal right internal carotid artery. Minimal lumen diameter 3.1 mm corresponding to 35% diameter stenosis. Left carotid system: Atherosclerotic calcification left carotid bifurcation. There is significant left carotid stenosis. Vertebral arteries: Both vertebral arteries widely patent without significant stenosis. Skeleton: Moderate degenerative changes cervical spine. No acute skeletal abnormality Other neck: Negative for mass or adenopathy. Upper chest: Right apical emphysema with pleuroparenchymal scarring. Review of the MIP images confirms the above findings CTA HEAD FINDINGS Anterior circulation: Atherosclerotic calcification in the cavernous carotid bilaterally. No significant stenosis. Anterior and middle cerebral arteries patent bilaterally without significant stenosis. Posterior circulation: Both vertebral arteries patent to the basilar. PICA patent bilaterally. Basilar widely patent. Superior cerebellar and posterior cerebral arteries patent bilaterally without stenosis. Venous  sinuses: Normal venous enhancement. Right transverse sinus dominant. Anatomic variants: None Review of the MIP images confirms the above findings IMPRESSION: 1. 35% diameter stenosis proximal right internal carotid artery. 2. Mild atherosclerotic disease left carotid bifurcation without significant stenosis. 3. Both vertebral arteries patent to the basilar without stenosis. 4. No significant intracranial stenosis or large vessel occlusion. Electronically Signed   By: Franchot Gallo M.D.   On: 09/03/2019 14:59   MR BRAIN WO CONTRAST  Result Date: 09/04/2019 CLINICAL DATA:  Stroke presentation yesterday EXAM: MRI HEAD WITHOUT CONTRAST TECHNIQUE: Only diffusion imaging was performed. COMPARISON:  CT studies 09/03/2019 FINDINGS: Brain: Diffusion imaging does not show any acute or subacute infarction. No abnormality seen affecting the brainstem or cerebellum. Cerebral hemispheres show generalized atrophy and an old right parietal cortical and subcortical infarction. There is chronic small-vessel change of the white matter. No sign of hemorrhage, hydrocephalus or extra-axial collection. Vascular: No vascular data Skull and upper cervical spine: No abnormality seen Sinuses/Orbits: No sign of sinusitis or orbital pathology. Other: None IMPRESSION: Diffusion only study does not show any acute or subacute infarction. Chronic white matter small vessel ischemic changes. Old right parietal infarction. Electronically Signed   By: Nelson Chimes M.D.   On: 09/04/2019 13:06   DG CHEST PORT 1 VIEW  Result Date: 09/08/2019 CLINICAL DATA:  Aspiration EXAM: PORTABLE CHEST 1 VIEW COMPARISON:  09/07/2019 FINDINGS: Cardiac shadow is stable. Aortic calcifications are again seen. Patchy infiltrates are again noted bilaterally worse on the right than the left but stable. No new focal abnormality is noted. IMPRESSION: Stable airspace opacities bilaterally as described. Electronically Signed   By: Inez Catalina M.D.   On: 09/08/2019 08:29    DG CHEST PORT 1 VIEW  Result Date: 09/07/2019  CLINICAL DATA:  Cough EXAM: PORTABLE CHEST 1 VIEW COMPARISON:  09/03/2019 FINDINGS: Cardiac shadow is enlarged but stable. Aortic calcifications are again seen. Increasing bilateral opacities are noted particularly in the right upper lobe and bases bilaterally. No sizable effusion is seen. Degenerative changes of the thoracic spine are noted. IMPRESSION: Increasing bilateral airspace opacities consistent with multifocal pneumonia. Electronically Signed   By: Inez Catalina M.D.   On: 09/07/2019 11:44   DG Chest Portable 1 View  Result Date: 09/03/2019 CLINICAL DATA:  Acute stroke. Altered mental status. EXAM: PORTABLE CHEST 1 VIEW COMPARISON:  06/26/2019 FINDINGS: Stable cardiomegaly. Aortic atherosclerosis noted. Mild airspace disease is seen in both lung bases, but shows significant improvement since previous study. No new or worsening areas of pulmonary opacity are seen. Mild elevation of left hemidiaphragm is unchanged. IMPRESSION: 1. Mild bibasilar airspace disease, with significant improvement since prior exam. 2. Stable cardiomegaly. Electronically Signed   By: Marlaine Hind M.D.   On: 09/03/2019 17:30   EEG adult  Result Date: 09/04/2019 Lora Havens, MD     09/04/2019  5:42 PM Patient Name: RODRICUS DEDRICK MRN: [id number] Epilepsy Attending: Lora Havens Referring Physician/Provider: Dr Rosalin Hawking Date:  09/04/2019 Duration: 23.27 mins Patient history: 86 y.o. male with history of stroke, TIA, HTN, HLD, Aortic atherosclerosis, ICA stenosis presenting with slurred speech, confusion, and aphasia. EEG to evaluate for seizure. Level of alertness: Awake AEDs during EEG study: None Technical aspects: This EEG study was done with scalp electrodes positioned according to the 10-20 International system of electrode placement. Electrical activity was acquired at a sampling rate of 500Hz  and reviewed with a high frequency filter of 70Hz  and a low frequency  filter of 1Hz . EEG data were recorded continuously and digitally stored. Description: The posterior dominant rhythm consists of 7.5 Hz activity of moderate voltage (25-35 uV) seen predominantly in posterior head regions, symmetric and reactive to eye opening and eye closing.  Hyperventilation and photic stimulation were not performed.   IMPRESSION: This study is within normal limits.  No seizures or epileptiform discharges were seen throughout the recording. Lora Havens   ECHOCARDIOGRAM COMPLETE  Result Date: 09/04/2019    ECHOCARDIOGRAM REPORT   Patient Name:   KYLLE FOSKETT [facility] Date of Exam: 09/04/2019 Medical Rec #:  [id number]     Height:       71.0 in Accession #:    [id number]    Weight:       187.8 lb Date of Birth:  February 25, 1921    BSA:          2.053 m Patient Age:    76 years      BP:           160/78 mmHg Patient Gender: M             HR:           59 bpm. Exam Location:  Inpatient Procedure: 2D Echo and Intracardiac Opacification Agent Indications:    Stroke 434.91 / I163.9  History:        Patient has prior history of Echocardiogram examinations, most                 recent 06/26/2017. Risk Factors:Hypertension. Sepsis                 Aortic atherosclerosis.  Sonographer:    Vikki Ports Turrentine Referring Phys: [id number] ASHISH ARORA  Sonographer Comments: Suboptimal subcostal window and Technically difficult study due to poor  echo windows. Image acquisition challenging due to uncooperative patient. IMPRESSIONS  1. Abnormal septal motion. Left ventricular ejection fraction, by estimation, is 50 to 55%. The left ventricle has low normal function. The left ventricle has no regional wall motion abnormalities. The left ventricular internal cavity size was mildly dilated. Left ventricular diastolic parameters are indeterminate.  2. Right ventricular systolic function is normal. The right ventricular size is normal.  3. The mitral valve is normal in structure. No evidence of mitral valve regurgitation. No  evidence of mitral stenosis.  4. The aortic valve is tricuspid. Aortic valve regurgitation is not visualized. moderate sclerosis especially the non coronary cusp.  5. The inferior vena cava is normal in size with greater than 50% respiratory variability, suggesting right atrial pressure of 3 mmHg. FINDINGS  Left Ventricle: Abnormal septal motion. Left ventricular ejection fraction, by estimation, is 50 to 55%. The left ventricle has low normal function. The left ventricle has no regional wall motion abnormalities. Definity contrast agent was given IV to delineate the left ventricular endocardial borders. The left ventricular internal cavity size was mildly dilated. There is no left ventricular hypertrophy. Left ventricular diastolic parameters are indeterminate. Right Ventricle: The right ventricular size is normal. No increase in right ventricular wall thickness. Right ventricular systolic function is normal. Left Atrium: Left atrial size was normal in size. Right Atrium: Right atrial size was normal in size. Pericardium: There is no evidence of pericardial effusion. Mitral Valve: The mitral valve is normal in structure. Normal mobility of the mitral valve leaflets. No evidence of mitral valve regurgitation. No evidence of mitral valve stenosis. Tricuspid Valve: The tricuspid valve is normal in structure. Tricuspid valve regurgitation is not demonstrated. No evidence of tricuspid stenosis. Aortic Valve: The aortic valve is tricuspid. Aortic valve regurgitation is not visualized. Moderate sclerosis especially the non coronary cusp. Pulmonic Valve: The pulmonic valve was normal in structure. Pulmonic valve regurgitation is not visualized. No evidence of pulmonic stenosis. Aorta: The aortic root is normal in size and structure. Venous: The inferior vena cava is normal in size with greater than 50% respiratory variability, suggesting right atrial pressure of 3 mmHg. IAS/Shunts: No atrial level shunt detected by color  flow Doppler.  LEFT VENTRICLE PLAX 2D LVIDd:         4.40 cm  Diastology LVIDs:         3.30 cm  LV e' medial:   4.64 cm/s LV PW:         1.10 cm  LV E/e' medial: 11.0 LV IVS:        1.10 cm LVOT diam:     1.80 cm LV SV:         51 LV SV Index:   25 LVOT Area:     2.54 cm  LEFT ATRIUM           Index LA Vol (A2C): 65.8 ml 32.05 ml/m  AORTIC VALVE LVOT Vmax:   84.20 cm/s LVOT Vmean:  63.200 cm/s LVOT VTI:    0.200 m  AORTA Ao Root diam: 3.30 cm MITRAL VALVE MV Area (PHT): 3.60 cm    SHUNTS MV Decel Time: 211 msec    Systemic VTI:  0.20 m MV E velocity: 51.00 cm/s  Systemic Diam: 1.80 cm MV A velocity: 94.30 cm/s MV E/A ratio:  0.54 Jenkins Rouge MD Electronically signed by Jenkins Rouge MD Signature Date/Time: 09/04/2019/3:43:58 PM    Final    CT HEAD CODE STROKE WO CONTRAST  Result Date: 09/03/2019  CLINICAL DATA:  Code stroke. Neuro deficit, acute, stroke suspected. Additional history provided: Aphasia, last known well 10:30 a.m. EXAM: CT HEAD WITHOUT CONTRAST TECHNIQUE: Contiguous axial images were obtained from the base of the skull through the vertex without intravenous contrast. COMPARISON:  Prior head CT examinations 06/21/2017 and earlier. FINDINGS: Brain: The exam is mildly motion degraded. Redemonstrated remote cortical/subcortical right parietal lobe infarct. Ill-defined hypoattenuation within the cerebral white matter is nonspecific, but consistent with chronic small vessel ischemic disease. Findings are similar to prior head CT 06/21/2017. Stable, moderate generalized parenchymal atrophy. There is no acute intracranial hemorrhage. No acute demarcated cortical infarct is identified No extra-axial fluid collection. No evidence of intracranial mass. No midline shift. Vascular: No hyperdense vessel.  Atherosclerotic calcifications. Skull: Normal. Negative for fracture or focal lesion. Sinuses/Orbits: Visualized orbits show no acute finding. Mild ethmoid sinus mucosal thickening. No significant mastoid  effusion. ASPECTS [facility] Stroke Program Early CT Score) - Ganglionic level infarction (caudate, lentiform nuclei, internal capsule, insula, M1-M3 cortex): 7 - Supraganglionic infarction (M4-M6 cortex): 3 Total score (0-10 with 10 being normal): 10 IMPRESSION: 1. Mildly motion degraded exam. 2. No evidence of acute intracranial abnormality.  ASPECTS is 10. 3. Redemonstrated chronic cortical/subcortical right parietal lobe infarct. 4. Stable generalized parenchymal atrophy and chronic small vessel ischemic disease. Electronically Signed   By: Kellie Simmering DO   On: 09/03/2019 14:31   VAS US CAROTID  Result Date: 09/04/2019 Carotid Arterial Duplex Study Indications:       CVA and Artifact seen on CTA at the left ICA. Risk Factors:      Prior CVA. Comparison Study:  Carotid duplex 06/26/17 - less than 50% ICA bilaterally. Performing Technologist: Oda Cogan RDMS, RVT  Examination Guidelines: A complete evaluation includes B-mode imaging, spectral Doppler, color Doppler, and power Doppler as needed of all accessible portions of each vessel. Bilateral testing is considered an integral part of a complete examination. Limited examinations for reoccurring indications may be performed as noted.  Right Carotid Findings: +----------+--------+--------+--------+---------------------+------------------+           PSV cm/sEDV cm/sStenosisPlaque Description   Comments           +----------+--------+--------+--------+---------------------+------------------+ CCA Prox  46      8                                                       +----------+--------+--------+--------+---------------------+------------------+ CCA Distal42      13                                   intimal thickening +----------+--------+--------+--------+---------------------+------------------+ ICA Prox  79      19      1-39%   irregular and                                                             calcific                                 +----------+--------+--------+--------+---------------------+------------------+ ICA Distal82  18                                                      +----------+--------+--------+--------+---------------------+------------------+ ECA       102     15                                                      +----------+--------+--------+--------+---------------------+------------------+ +----------+--------+-------+----------------+-------------------+           PSV cm/sEDV cmsDescribe        Arm Pressure (mmHG) +----------+--------+-------+----------------+-------------------+ [id number]             Multiphasic, WNL                    +----------+--------+-------+----------------+-------------------+ +---------+--------+--+--------+--+---------+ VertebralPSV cm/s64EDV cm/s15Antegrade +---------+--------+--+--------+--+---------+  Left Carotid Findings: +----------+--------+--------+--------+------------------+------------------+           PSV cm/sEDV cm/sStenosisPlaque DescriptionComments           +----------+--------+--------+--------+------------------+------------------+ CCA Prox  62      13                                                   +----------+--------+--------+--------+------------------+------------------+ CCA Distal36      10                                intimal thickening +----------+--------+--------+--------+------------------+------------------+ ICA Prox  52      15      1-39%                     intimal thickening +----------+--------+--------+--------+------------------+------------------+ ICA Distal58      17                                                   +----------+--------+--------+--------+------------------+------------------+ ECA       78      12                                                   +----------+--------+--------+--------+------------------+------------------+  +----------+--------+--------+----------------+-------------------+           PSV cm/sEDV cm/sDescribe        Arm Pressure (mmHG) +----------+--------+--------+----------------+-------------------+ [id number]             Multiphasic, WNL                    +----------+--------+--------+----------------+-------------------+ +---------+--------+--+--------+--+---------+ VertebralPSV cm/s88EDV cm/s25Antegrade +---------+--------+--+--------+--+---------+   Summary: Right Carotid: Velocities in the right ICA are consistent with a 1-39% stenosis. Left Carotid: Velocities in the left ICA are consistent with a 1-39% stenosis. Vertebrals:  Bilateral vertebral arteries demonstrate antegrade flow. Subclavians: Normal flow hemodynamics were seen in bilateral subclavian  arteries. *See table(s) above for measurements and observations.  Electronically signed by Antony Contras MD on 09/04/2019 at 12:58:07 PM.    Final     Microbiology: Recent Results (from the past 240 hour(s))  SARS Coronavirus 2 by RT PCR (hospital order, performed in [facility] hospital lab) Nasopharyngeal Nasopharyngeal Swab     Status: None   Collection Time: 09/03/19  4:30 PM   Specimen: Nasopharyngeal Swab  Result Value Ref Range Status   SARS Coronavirus 2 NEGATIVE NEGATIVE Final    Comment: (NOTE) SARS-CoV-2 target nucleic acids are NOT DETECTED. The SARS-CoV-2 RNA is generally detectable in upper and lower respiratory specimens during the acute phase of infection. The lowest concentration of SARS-CoV-2 viral copies this assay can detect is 250 copies / mL. A negative result does not preclude SARS-CoV-2 infection and should not be used as the sole basis for treatment or other patient management decisions.  A negative result may occur with improper specimen collection / handling, submission of specimen other than nasopharyngeal swab, presence of viral mutation(s) within the areas targeted by this assay,  and inadequate number of viral copies (<250 copies / mL). A negative result must be combined with clinical observations, patient history, and epidemiological information. Fact Sheet for Patients:   StrictlyIdeas.no Fact Sheet for Healthcare Providers: BankingDealers.co.za This test is not yet approved or cleared  by the [place] FDA and has been authorized for detection and/or diagnosis of SARS-CoV-2 by FDA under an Emergency Use Authorization (EUA).  This EUA will remain in effect (meaning this test can be used) for the duration of the COVID-19 declaration under Section 564(b)(1) of the Act, 21 U.S.C. section 360bbb-3(b)(1), unless the authorization is terminated or revoked sooner. Performed at [facility], [place] [address], [place], [place] [address]   MRSA PCR Screening     Status: None   Collection Time: 09/03/19  4:30 PM   Specimen: Nasopharyngeal Swab  Result Value Ref Range Status   MRSA by PCR NEGATIVE NEGATIVE Final    Comment:        The GeneXpert MRSA Assay (FDA approved for NASAL specimens only), is one component of a comprehensive MRSA colonization surveillance program. It is not intended to diagnose MRSA infection nor to guide or monitor treatment for MRSA infections. Performed at [facility], [place] [address], [place], [place] [address]   SARS CORONAVIRUS 2 (TAT 6-24 HRS) Nasopharyngeal Nasopharyngeal Swab     Status: None   Collection Time: 09/06/19  4:47 PM   Specimen: Nasopharyngeal Swab  Result Value Ref Range Status   SARS Coronavirus 2 NEGATIVE NEGATIVE Final    Comment: (NOTE) SARS-CoV-2 target nucleic acids are NOT DETECTED. The SARS-CoV-2 RNA is generally detectable in upper and lower respiratory specimens during the acute phase of infection. Negative results do not preclude SARS-CoV-2 infection, do not rule out co-infections with other pathogens, and should not be used as the sole basis  for treatment or other patient management decisions. Negative results must be combined with clinical observations, patient history, and epidemiological information. The expected result is Negative. Fact Sheet for Patients: SugarRoll.be Fact Sheet for Healthcare Providers: https://www.woods-mathews.com/ This test is not yet approved or cleared by the [place] FDA and  has been authorized for detection and/or diagnosis of SARS-CoV-2 by FDA under an Emergency Use Authorization (EUA). This EUA will remain  in effect (meaning this test can be used) for the duration of the COVID-19 declaration under Section 56 4(b)(1) of the  Act, 21 U.S.C. section 360bbb-3(b)(1), unless the authorization is terminated or revoked sooner. Performed at [facility], [place] [address], [place], [place] [address]   SARS CORONAVIRUS 2 (TAT 6-24 HRS) Nasopharyngeal Nasopharyngeal Swab     Status: None   Collection Time: 09/10/19  3:11 PM   Specimen: Nasopharyngeal Swab  Result Value Ref Range Status   SARS Coronavirus 2 NEGATIVE NEGATIVE Final    Comment: (NOTE) SARS-CoV-2 target nucleic acids are NOT DETECTED. The SARS-CoV-2 RNA is generally detectable in upper and lower respiratory specimens during the acute phase of infection. Negative results do not preclude SARS-CoV-2 infection, do not rule out co-infections with other pathogens, and should not be used as the sole basis for treatment or other patient management decisions. Negative results must be combined with clinical observations, patient history, and epidemiological information. The expected result is Negative. Fact Sheet for Patients: SugarRoll.be Fact Sheet for Healthcare Providers: https://www.woods-mathews.com/ This test is not yet approved or cleared by the [place] FDA and  has been authorized for detection and/or diagnosis of SARS-CoV-2 by FDA under  an Emergency Use Authorization (EUA). This EUA will remain  in effect (meaning this test can be used) for the duration of the COVID-19 declaration under Section 56 4(b)(1) of the Act, 21 U.S.C. section 360bbb-3(b)(1), unless the authorization is terminated or revoked sooner. Performed at [facility], [place] [address] [place], [place] [address]      Labs: Basic Metabolic Panel: Recent Labs  Lab 09/07/19 [phone] 09/08/19 0431 09/09/19 0444 09/09/19 1202 09/10/19 0526 09/11/19 0422  NA 139 145 148*  --  148* 144  K 3.7 3.9 3.3*  --  3.6 3.5  CL 109 116* 119*  --  114* 111  CO2 20* 23 23  --  23 25  GLUCOSE 133* 121* 118*  --  93 109*  BUN 31* 36* 26*  --  20 15  CREATININE 1.75* 1.69* 1.14  --  1.09 1.04  CALCIUM 9.0 8.9 8.6*  --  8.9 8.8*  MG  --   --   --  1.7  --  1.6*   Liver Function Tests: Recent Labs  Lab 09/10/19 0526  AST 28  ALT 17  ALKPHOS 48  BILITOT 1.2  PROT 5.3*  ALBUMIN 2.5*   No results for input(s): LIPASE, AMYLASE in the last 168 hours. No results for input(s): AMMONIA in the last 168 hours. CBC: Recent Labs  Lab 09/06/19 0448 09/07/19 0339 09/08/19 0431 09/09/19 0444 09/10/19 0526  WBC 11.7* 10.9* 5.7 5.6 7.5  HGB 12.7* 11.7* 11.6* 10.4* 10.6*  HCT 38.5* 35.9* 35.1* 32.4* 33.0*  MCV 96.3 96.5 98.3 97.6 97.3  PLT 167 160 154 155 170   Cardiac Enzymes: No results for input(s): CKTOTAL, CKMB, CKMBINDEX, TROPONINI in the last 168 hours. BNP: BNP (last 3 results) No results for input(s): BNP in the last 8760 hours.  ProBNP (last 3 results) No results for input(s): PROBNP in the last 8760 hours.  CBG: Recent Labs  Lab 09/08/19 1206 09/08/19 1548 09/08/19 2130 09/09/19 0610 09/09/19 2050  GLUCAP 125* 129* 120* 114* 100*       Signed:  Tip Atienza  [facility] 09/11/2019, 9:59 AM

## 2019-09-11 NOTE — Progress Notes (Signed)
Late entry for missed modified rankin score. Score based on the review of the medical record.   09/11/19 1430  Modified Rankin (Stroke Patients Only)  Pre-Morbid Rankin Score 4  Modified Rankin [place], [place]   Acute Rehabilitation Services  Pager [phone] Office [phone] 09/11/2019

## 2019-09-12 DIAGNOSIS — Z8673 Personal history of transient ischemic attack (TIA), and cerebral infarction without residual deficits: Secondary | ICD-10-CM

## 2019-09-12 DIAGNOSIS — I10 Essential (primary) hypertension: Secondary | ICD-10-CM

## 2019-09-12 DIAGNOSIS — Z8701 Personal history of pneumonia (recurrent): Secondary | ICD-10-CM

## 2019-09-12 DIAGNOSIS — G3184 Mild cognitive impairment, so stated: Secondary | ICD-10-CM

## 2019-09-12 DIAGNOSIS — N400 Enlarged prostate without lower urinary tract symptoms: Secondary | ICD-10-CM

## 2019-09-12 DIAGNOSIS — F05 Delirium due to known physiological condition: Secondary | ICD-10-CM | POA: Diagnosis not present

## 2019-09-17 DIAGNOSIS — L98411 Non-pressure chronic ulcer of buttock limited to breakdown of skin: Secondary | ICD-10-CM

## 2019-09-26 DIAGNOSIS — R451 Restlessness and agitation: Secondary | ICD-10-CM

## 2019-09-26 DIAGNOSIS — F0150 Vascular dementia without behavioral disturbance: Secondary | ICD-10-CM | POA: Diagnosis not present

## 2019-10-01 ENCOUNTER — Ambulatory Visit: Payer: Self-pay | Admitting: Nurse Practitioner

## 2019-10-01 DIAGNOSIS — F39 Unspecified mood [affective] disorder: Secondary | ICD-10-CM | POA: Diagnosis not present

## 2019-10-01 DIAGNOSIS — I10 Essential (primary) hypertension: Secondary | ICD-10-CM

## 2019-10-01 DIAGNOSIS — Z8673 Personal history of transient ischemic attack (TIA), and cerebral infarction without residual deficits: Secondary | ICD-10-CM

## 2019-10-01 DIAGNOSIS — F0150 Vascular dementia without behavioral disturbance: Secondary | ICD-10-CM

## 2019-10-01 DIAGNOSIS — N400 Enlarged prostate without lower urinary tract symptoms: Secondary | ICD-10-CM

## 2019-10-03 ENCOUNTER — Ambulatory Visit: Payer: Self-pay | Admitting: Nurse Practitioner

## 2019-10-14 DIAGNOSIS — F05 Delirium due to known physiological condition: Secondary | ICD-10-CM | POA: Diagnosis not present

## 2019-10-16 DIAGNOSIS — R05 Cough: Secondary | ICD-10-CM

## 2019-10-22 DIAGNOSIS — J690 Pneumonitis due to inhalation of food and vomit: Secondary | ICD-10-CM

## 2019-10-24 DIAGNOSIS — J690 Pneumonitis due to inhalation of food and vomit: Secondary | ICD-10-CM

## 2019-10-24 DIAGNOSIS — R1312 Dysphagia, oropharyngeal phase: Secondary | ICD-10-CM | POA: Diagnosis not present

## 2019-10-24 DIAGNOSIS — I10 Essential (primary) hypertension: Secondary | ICD-10-CM

## 2019-10-24 DIAGNOSIS — F0151 Vascular dementia with behavioral disturbance: Secondary | ICD-10-CM

## 2019-10-24 DIAGNOSIS — F39 Unspecified mood [affective] disorder: Secondary | ICD-10-CM

## 2019-11-06 DIAGNOSIS — R451 Restlessness and agitation: Secondary | ICD-10-CM | POA: Diagnosis not present

## 2019-11-06 DIAGNOSIS — F0150 Vascular dementia without behavioral disturbance: Secondary | ICD-10-CM

## 2019-11-06 DIAGNOSIS — F39 Unspecified mood [affective] disorder: Secondary | ICD-10-CM

## 2019-11-06 DIAGNOSIS — R638 Other symptoms and signs concerning food and fluid intake: Secondary | ICD-10-CM | POA: Diagnosis not present

## 2019-11-25 DIAGNOSIS — R451 Restlessness and agitation: Secondary | ICD-10-CM | POA: Diagnosis not present

## 2019-11-27 DIAGNOSIS — N401 Enlarged prostate with lower urinary tract symptoms: Secondary | ICD-10-CM

## 2019-11-27 DIAGNOSIS — I10 Essential (primary) hypertension: Secondary | ICD-10-CM | POA: Diagnosis not present

## 2019-11-27 DIAGNOSIS — R05 Cough: Secondary | ICD-10-CM

## 2019-11-27 DIAGNOSIS — R451 Restlessness and agitation: Secondary | ICD-10-CM

## 2019-11-27 DIAGNOSIS — F0150 Vascular dementia without behavioral disturbance: Secondary | ICD-10-CM

## 2019-11-27 DIAGNOSIS — I700 Atherosclerosis of aorta: Secondary | ICD-10-CM

## 2019-11-27 DIAGNOSIS — F39 Unspecified mood [affective] disorder: Secondary | ICD-10-CM | POA: Diagnosis not present

## 2019-11-27 DIAGNOSIS — R1312 Dysphagia, oropharyngeal phase: Secondary | ICD-10-CM

## 2019-11-27 DIAGNOSIS — J690 Pneumonitis due to inhalation of food and vomit: Secondary | ICD-10-CM

## 2019-11-28 ENCOUNTER — Telehealth: Payer: Self-pay | Admitting: Nurse Practitioner

## 2019-11-28 NOTE — Telephone Encounter (Signed)
I called back and left a message---he is hospice appropriate and has less than 6 month expected survival

## 2019-11-28 NOTE — Telephone Encounter (Signed)
Krystal from Hospice called stating they need Dr.Letvak to agree that patient is terminally ill and to see if he would become Hospice Attendee for [facility]. She stated he can give her a call back at [phone] and leave a detailed message if not able to reach her.

## 2019-12-06 ENCOUNTER — Telehealth: Payer: Self-pay | Admitting: Adult Health Nurse Practitioner

## 2019-12-06 NOTE — Telephone Encounter (Signed)
Spoke with wife and her husband passed away last 2022/08/06.  Offered bereavement services and she already has appointment set up with hospice counselor for next week.   Jakory Matsuo K. Olena Heckle NP

## 2019-12-18 DEATH — deceased

## 2020-03-12 ENCOUNTER — Encounter: Payer: Self-pay | Admitting: Nurse Practitioner

## 2020-03-16 NOTE — Telephone Encounter (Signed)
Copy of message printed and given to [employer] and safety inspector member)

## 2021-05-14 IMAGING — DX DG CHEST 1V PORT
1 series · 1 of 1 positions shown · non-contrast
Comparison: Chest radiograph dated 06/21/2019.

CLINICAL DATA: [AGE] male with shortness of breath.

EXAM:
PORTABLE CHEST 1 VIEW

[chest ap]
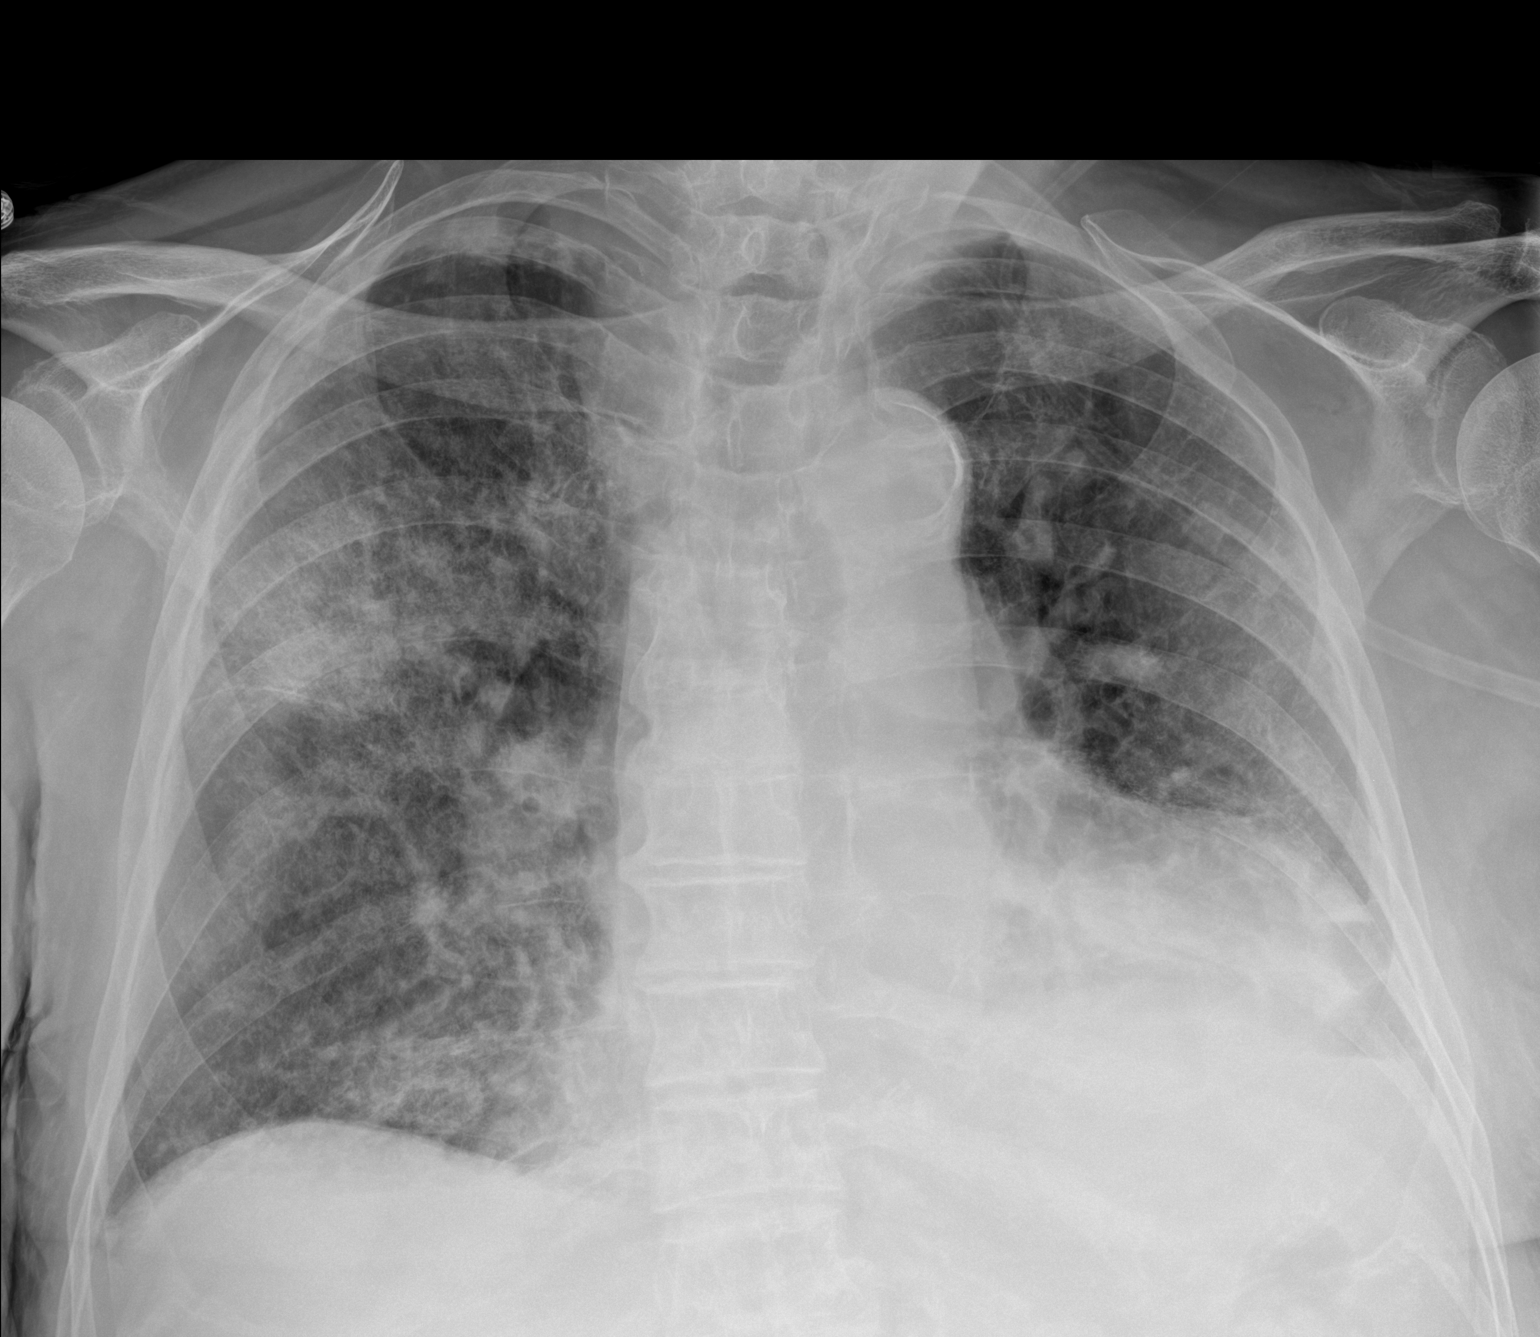

[1 of 1 positions shown; findings below may reference images not displayed]

FINDINGS: Diffuse interstitial prominence and hazy airspace densities
throughout the lungs, with a newly developed area of airspace
opacity in the right upper lung field most concerning for pneumonia.
Clinical correlation and follow-up recommended. A small left pleural
effusion may be present. There is no pneumothorax. There is mild
cardiomegaly. Atherosclerotic calcification of the aorta. No acute
osseous pathology.
IMPRESSION: Findings most concerning for pneumonia. Clinical correlation and
follow-up recommended.

## 2021-07-25 IMAGING — CT CT HEAD CODE STROKE
3 of 5 series · 15 of 47 positions shown, 18 images · non-contrast
Comparison: Prior head CT examinations 06/21/2017 and earlier.

CLINICAL DATA: Code stroke. Neuro deficit, acute, stroke suspected.
Additional history provided: Aphasia, last known well [DATE] a.m.

EXAM:
CT HEAD WITHOUT CONTRAST
TECHNIQUE: Contiguous axial images were obtained from the base of the skull
through the vertex without intravenous contrast.

[Series 5: head 2.0 h70h · axial · 0.44mm/px · z∈[-148,-12]mm · 9 of 78 slices shown, 12 images]
[im 5/78  brain]
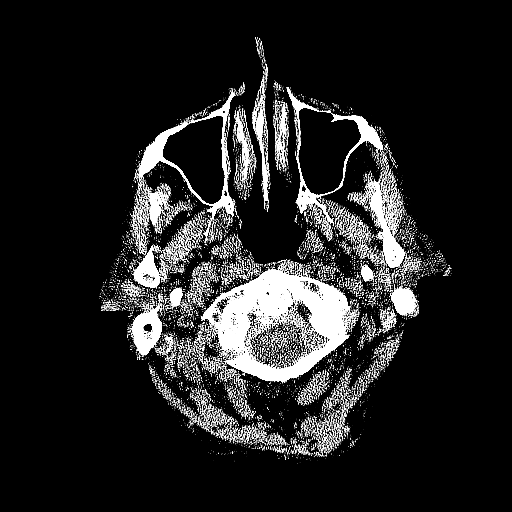
[im 5/78  bone]
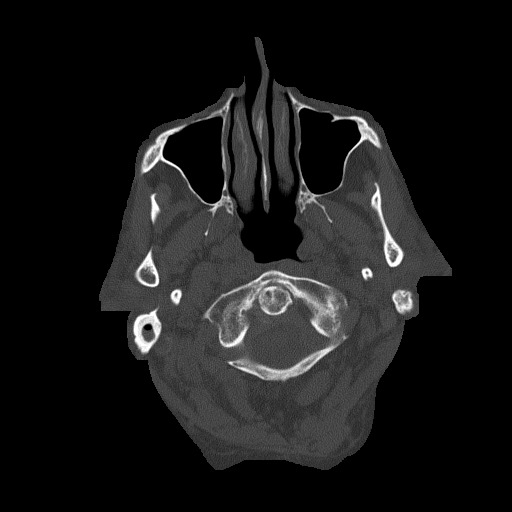
[im 15/78  brain]
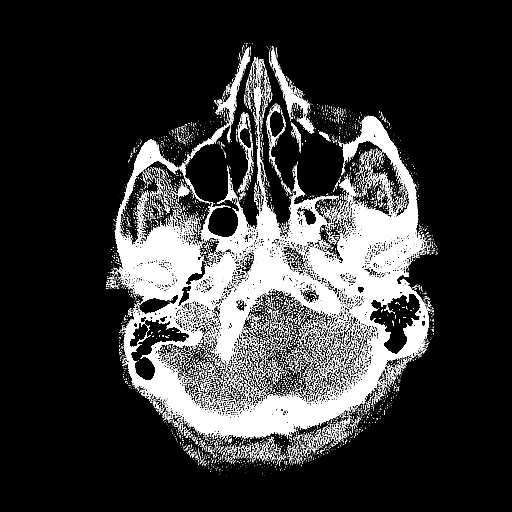
[im 25/78  brain]
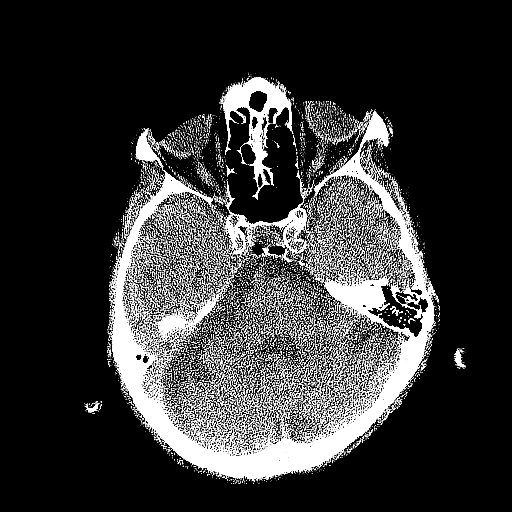
[im 29/78  brain]
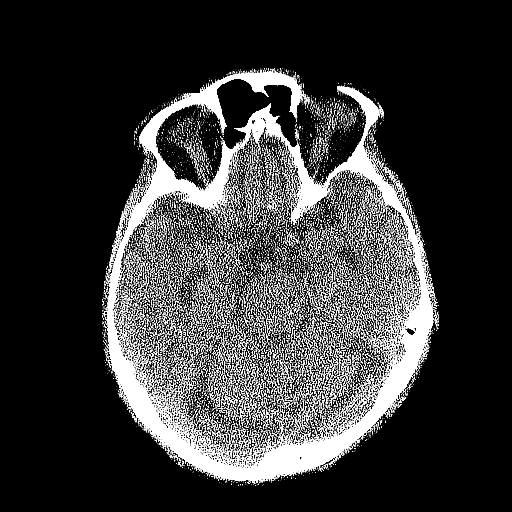
[im 39/78  brain]
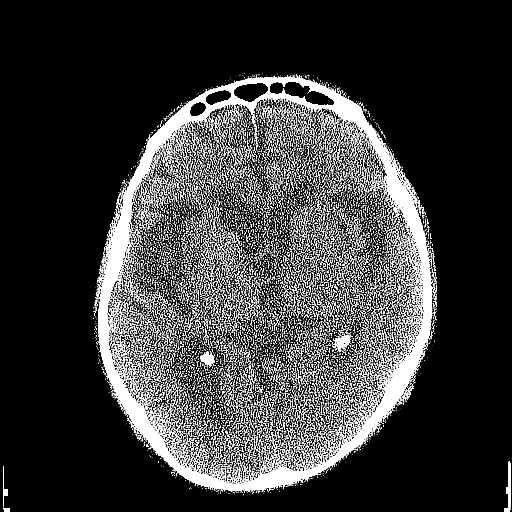
[im 39/78  bone]
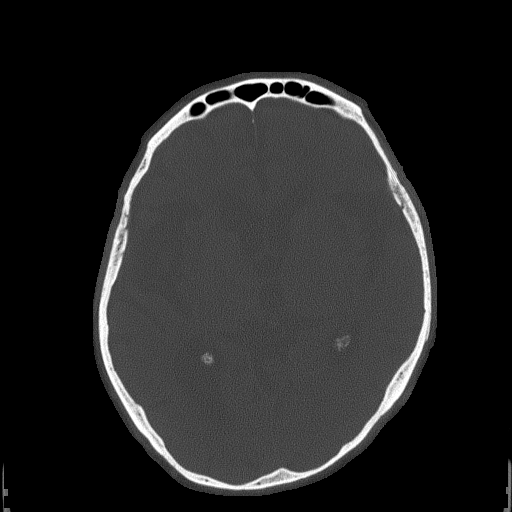
[im 49/78  brain]
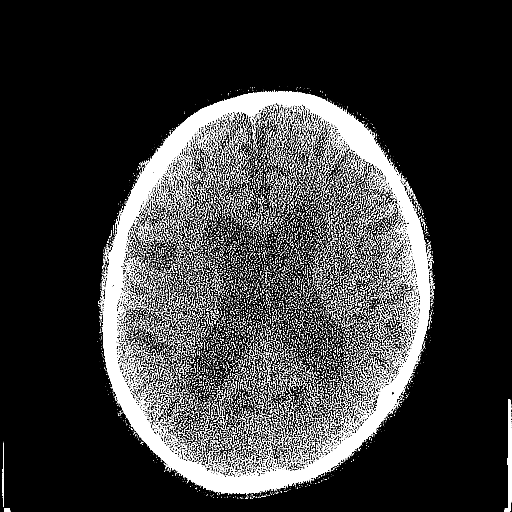
[im 53/78  brain]
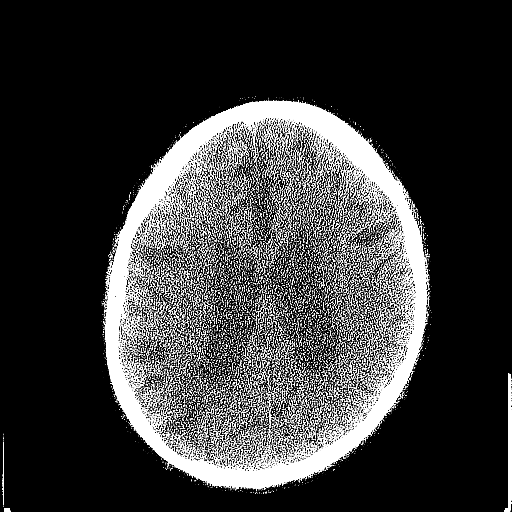
[im 63/78  brain]
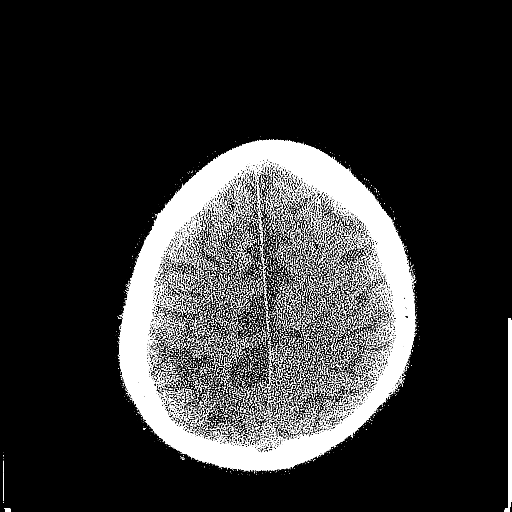
[im 73/78  brain]
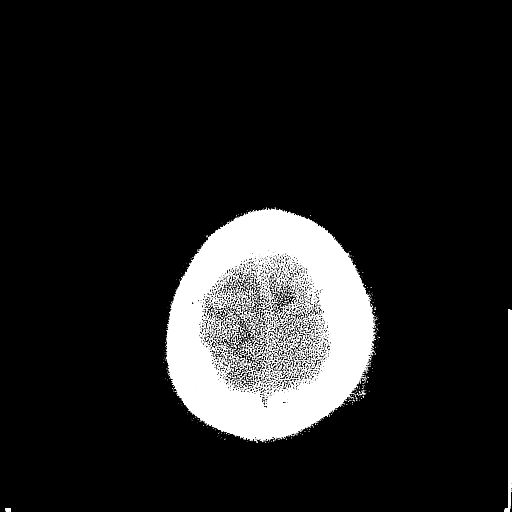
[im 73/78  bone]
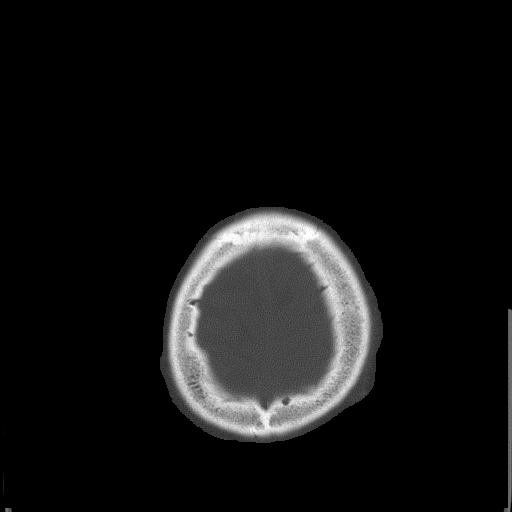

[Series 6: head 3.0 mpr cor · coronal · 0.34mm/px · 3 of 76 slices shown]
[im 26/76  brain]
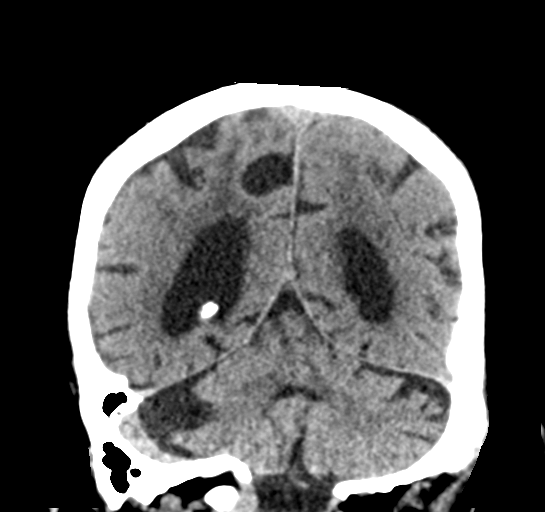
[im 34/76  brain]
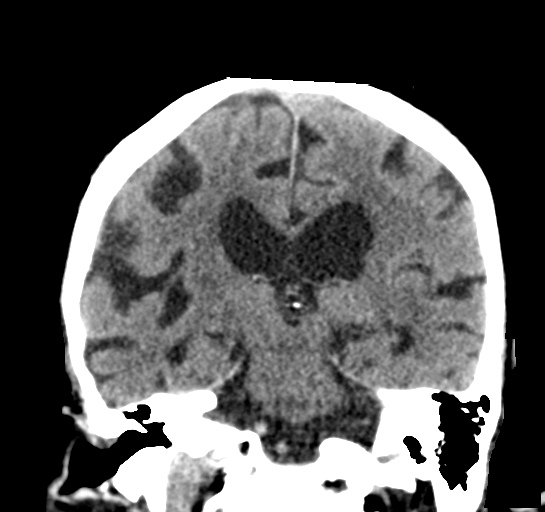
[im 42/76  brain]
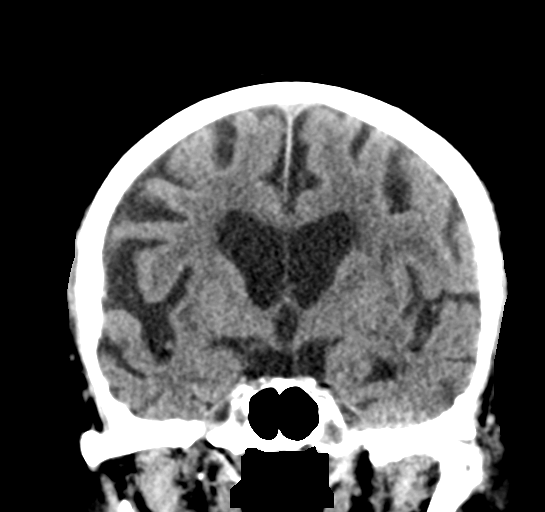

[Series 7: head 3.0 mpr sag · sagittal · 0.36mm/px · 3 of 67 slices shown]
[im 23/67  brain]
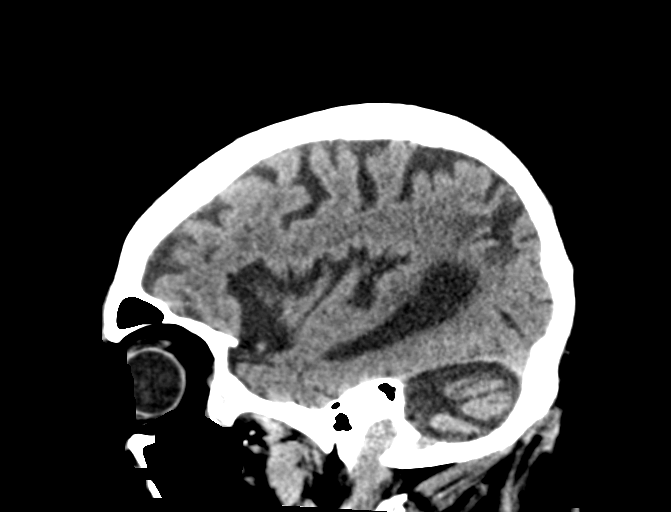
[im 34/67  brain]
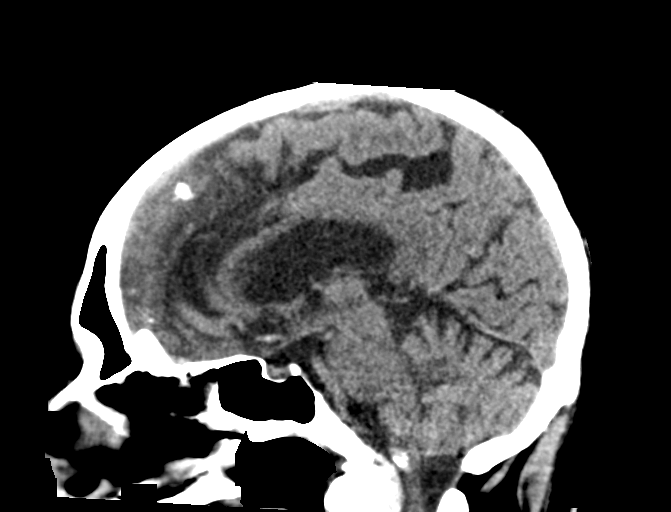
[im 45/67  brain]
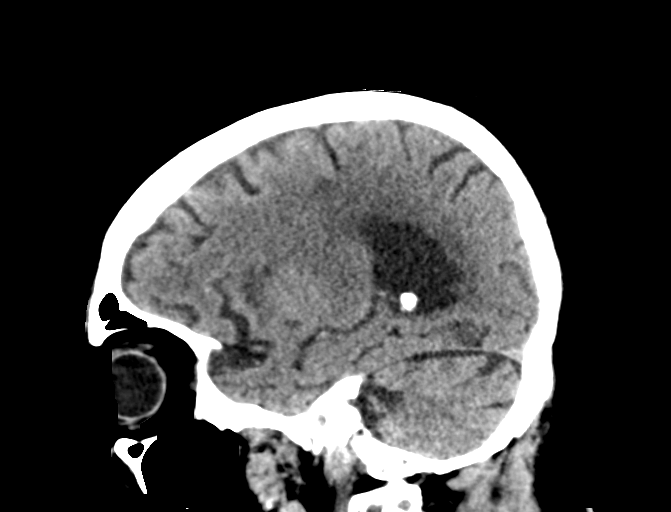

[15 of 47 positions shown; findings below may reference images not displayed]

FINDINGS: Brain:

The exam is mildly motion degraded.

Redemonstrated remote cortical/subcortical right parietal lobe
infarct.

Ill-defined hypoattenuation within the cerebral white matter is
nonspecific, but consistent with chronic small vessel ischemic
disease. Findings are similar to prior head CT 06/21/2017. Stable,
moderate generalized parenchymal atrophy.

There is no acute intracranial hemorrhage.

No acute demarcated cortical infarct is identified

No extra-axial fluid collection.

No evidence of intracranial mass.

No midline shift.

Vascular: No hyperdense vessel.  Atherosclerotic calcifications.

Skull: Normal. Negative for fracture or focal lesion.

Sinuses/Orbits: Visualized orbits show no acute finding. Mild
ethmoid sinus mucosal thickening. No significant mastoid effusion.

ASPECTS (Alberta Stroke Program Early CT Score)

- Ganglionic level infarction (caudate, lentiform nuclei, internal
capsule, insula, M1-M3 cortex): 7

- Supraganglionic infarction (M4-M6 cortex): 3

Total score (0-10 with 10 being normal): 10
IMPRESSION: 1. Mildly motion degraded exam.
2. No evidence of acute intracranial abnormality.  ASPECTS is 10.
3. Redemonstrated chronic cortical/subcortical right parietal lobe
infarct.
4. Stable generalized parenchymal atrophy and chronic small vessel
ischemic disease.
